# Patient Record
Sex: Female | Born: 1955 | Race: White | Hispanic: Yes | Marital: Married | Smoking: Never smoker
Health system: Southern US, Community
[De-identification: ages and names within clinical notes are randomized; demographics above are authoritative.]

## PROBLEM LIST (undated history)

## (undated) DIAGNOSIS — A5901 Trichomonal vulvovaginitis: Secondary | ICD-10-CM

## (undated) DIAGNOSIS — I350 Nonrheumatic aortic (valve) stenosis: Secondary | ICD-10-CM

## (undated) DIAGNOSIS — I8390 Asymptomatic varicose veins of unspecified lower extremity: Secondary | ICD-10-CM

## (undated) DIAGNOSIS — I10 Essential (primary) hypertension: Secondary | ICD-10-CM

## (undated) DIAGNOSIS — G4730 Sleep apnea, unspecified: Secondary | ICD-10-CM

## (undated) DIAGNOSIS — IMO0002 Reserved for concepts with insufficient information to code with codable children: Secondary | ICD-10-CM

## (undated) DIAGNOSIS — E669 Obesity, unspecified: Secondary | ICD-10-CM

## (undated) DIAGNOSIS — K8020 Calculus of gallbladder without cholecystitis without obstruction: Secondary | ICD-10-CM

## (undated) DIAGNOSIS — K219 Gastro-esophageal reflux disease without esophagitis: Secondary | ICD-10-CM

## (undated) DIAGNOSIS — F4321 Adjustment disorder with depressed mood: Secondary | ICD-10-CM

## (undated) DIAGNOSIS — F32A Depression, unspecified: Secondary | ICD-10-CM

## (undated) DIAGNOSIS — J309 Allergic rhinitis, unspecified: Secondary | ICD-10-CM

## (undated) DIAGNOSIS — Z86010 Personal history of colonic polyps: Secondary | ICD-10-CM

## (undated) DIAGNOSIS — M25569 Pain in unspecified knee: Secondary | ICD-10-CM

## (undated) DIAGNOSIS — T7840XA Allergy, unspecified, initial encounter: Secondary | ICD-10-CM

## (undated) DIAGNOSIS — R109 Unspecified abdominal pain: Secondary | ICD-10-CM

## (undated) DIAGNOSIS — R011 Cardiac murmur, unspecified: Secondary | ICD-10-CM

## (undated) DIAGNOSIS — H9319 Tinnitus, unspecified ear: Secondary | ICD-10-CM

## (undated) DIAGNOSIS — D376 Neoplasm of uncertain behavior of liver, gallbladder and bile ducts: Secondary | ICD-10-CM

## (undated) DIAGNOSIS — F419 Anxiety disorder, unspecified: Secondary | ICD-10-CM

## (undated) HISTORY — DX: Anxiety disorder, unspecified: F41.9

## (undated) HISTORY — DX: Depression, unspecified: F32.A

## (undated) HISTORY — DX: Allergy, unspecified, initial encounter: T78.40XA

## (undated) HISTORY — PX: ABDOMINAL HYSTERECTOMY: SHX81

## (undated) HISTORY — DX: Nonrheumatic aortic (valve) stenosis: I35.0

## (undated) HISTORY — DX: Sleep apnea, unspecified: G47.30

## (undated) HISTORY — PX: APPENDECTOMY: SHX54

## (undated) HISTORY — PX: CHOLECYSTECTOMY: SHX55

## (undated) HISTORY — DX: Essential (primary) hypertension: I10

## (undated) HISTORY — DX: Cardiac murmur, unspecified: R01.1

---
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
## 2004-02-11 ENCOUNTER — Emergency Department (HOSPITAL_COMMUNITY): Admission: EM | Admit: 2004-02-11 | Discharge: 2004-02-11 | Payer: Self-pay | Admitting: Emergency Medicine

## 2004-02-15 ENCOUNTER — Emergency Department (HOSPITAL_COMMUNITY): Admission: EM | Admit: 2004-02-15 | Discharge: 2004-02-15 | Payer: Self-pay | Admitting: Emergency Medicine

## 2005-05-13 ENCOUNTER — Emergency Department (HOSPITAL_COMMUNITY): Admission: EM | Admit: 2005-05-13 | Discharge: 2005-05-13 | Payer: Self-pay | Admitting: Emergency Medicine

## 2008-10-06 ENCOUNTER — Emergency Department (HOSPITAL_COMMUNITY): Admission: EM | Admit: 2008-10-06 | Discharge: 2008-10-06 | Payer: Self-pay | Admitting: Emergency Medicine

## 2009-05-05 ENCOUNTER — Ambulatory Visit: Payer: Self-pay | Admitting: Physician Assistant

## 2009-05-05 ENCOUNTER — Telehealth: Payer: Self-pay | Admitting: Physician Assistant

## 2009-05-05 DIAGNOSIS — R109 Unspecified abdominal pain: Secondary | ICD-10-CM | POA: Insufficient documentation

## 2009-05-05 DIAGNOSIS — I8390 Asymptomatic varicose veins of unspecified lower extremity: Secondary | ICD-10-CM

## 2009-05-05 DIAGNOSIS — I10 Essential (primary) hypertension: Secondary | ICD-10-CM

## 2009-05-05 HISTORY — DX: Essential (primary) hypertension: I10

## 2009-05-05 HISTORY — DX: Asymptomatic varicose veins of unspecified lower extremity: I83.90

## 2009-05-05 HISTORY — DX: Unspecified abdominal pain: R10.9

## 2009-05-09 ENCOUNTER — Ambulatory Visit (HOSPITAL_COMMUNITY): Admission: RE | Admit: 2009-05-09 | Discharge: 2009-05-09 | Payer: Self-pay | Admitting: Internal Medicine

## 2009-05-09 ENCOUNTER — Encounter: Payer: Self-pay | Admitting: Physician Assistant

## 2009-05-12 ENCOUNTER — Encounter: Payer: Self-pay | Admitting: Physician Assistant

## 2009-05-19 ENCOUNTER — Ambulatory Visit: Payer: Self-pay | Admitting: Physician Assistant

## 2009-05-22 ENCOUNTER — Ambulatory Visit (HOSPITAL_COMMUNITY): Admission: RE | Admit: 2009-05-22 | Discharge: 2009-05-22 | Payer: Self-pay | Admitting: Internal Medicine

## 2009-05-22 ENCOUNTER — Encounter: Payer: Self-pay | Admitting: Physician Assistant

## 2009-05-24 ENCOUNTER — Ambulatory Visit: Payer: Self-pay | Admitting: *Deleted

## 2009-05-26 ENCOUNTER — Encounter (INDEPENDENT_AMBULATORY_CARE_PROVIDER_SITE_OTHER): Payer: Self-pay | Admitting: *Deleted

## 2009-05-26 LAB — CONVERTED CEMR LAB
HDL: 48 mg/dL (ref >39)
Triglycerides: 134 mg/dL (ref <150)

## 2009-05-30 ENCOUNTER — Telehealth: Payer: Self-pay | Admitting: Physician Assistant

## 2009-05-30 ENCOUNTER — Encounter: Payer: Self-pay | Admitting: Physician Assistant

## 2009-05-30 DIAGNOSIS — K8020 Calculus of gallbladder without cholecystitis without obstruction: Secondary | ICD-10-CM

## 2009-05-30 DIAGNOSIS — D376 Neoplasm of uncertain behavior of liver, gallbladder and bile ducts: Secondary | ICD-10-CM | POA: Insufficient documentation

## 2009-05-30 HISTORY — DX: Calculus of gallbladder without cholecystitis without obstruction: K80.20

## 2009-05-30 HISTORY — DX: Neoplasm of uncertain behavior of liver, gallbladder and bile ducts: D37.6

## 2009-06-12 ENCOUNTER — Ambulatory Visit: Payer: Self-pay | Admitting: Physician Assistant

## 2009-06-12 DIAGNOSIS — M25569 Pain in unspecified knee: Secondary | ICD-10-CM

## 2009-06-12 DIAGNOSIS — K219 Gastro-esophageal reflux disease without esophagitis: Secondary | ICD-10-CM

## 2009-06-12 HISTORY — DX: Pain in unspecified knee: M25.569

## 2009-06-12 HISTORY — DX: Gastro-esophageal reflux disease without esophagitis: K21.9

## 2009-06-12 LAB — CONVERTED CEMR LAB
Calcium: 9.4 mg/dL (ref 8.4–10.5)
Chloride: 104 meq/L (ref 96–112)
Creatinine, Ser: 0.81 mg/dL (ref 0.40–1.20)

## 2009-06-13 ENCOUNTER — Encounter: Payer: Self-pay | Admitting: Physician Assistant

## 2009-07-14 ENCOUNTER — Telehealth: Payer: Self-pay | Admitting: Physician Assistant

## 2009-07-18 ENCOUNTER — Ambulatory Visit (HOSPITAL_COMMUNITY): Admission: RE | Admit: 2009-07-18 | Discharge: 2009-07-18 | Payer: Self-pay | Admitting: General Surgery

## 2009-08-12 HISTORY — PX: COLONOSCOPY: SHX174

## 2009-08-23 ENCOUNTER — Encounter: Payer: Self-pay | Admitting: Physician Assistant

## 2009-08-30 ENCOUNTER — Ambulatory Visit: Payer: Self-pay | Admitting: Physician Assistant

## 2009-08-30 DIAGNOSIS — A5901 Trichomonal vulvovaginitis: Secondary | ICD-10-CM

## 2009-08-30 DIAGNOSIS — F4321 Adjustment disorder with depressed mood: Secondary | ICD-10-CM | POA: Insufficient documentation

## 2009-08-30 HISTORY — DX: Trichomonal vulvovaginitis: A59.01

## 2009-08-30 HISTORY — DX: Adjustment disorder with depressed mood: F43.21

## 2009-08-31 ENCOUNTER — Encounter: Payer: Self-pay | Admitting: Physician Assistant

## 2009-09-04 ENCOUNTER — Encounter: Payer: Self-pay | Admitting: Physician Assistant

## 2009-09-07 ENCOUNTER — Ambulatory Visit (HOSPITAL_COMMUNITY): Admission: RE | Admit: 2009-09-07 | Discharge: 2009-09-07 | Payer: Self-pay | Admitting: Internal Medicine

## 2009-12-04 ENCOUNTER — Ambulatory Visit: Payer: Self-pay | Admitting: Physician Assistant

## 2009-12-04 DIAGNOSIS — J309 Allergic rhinitis, unspecified: Secondary | ICD-10-CM

## 2009-12-04 HISTORY — DX: Allergic rhinitis, unspecified: J30.9

## 2010-02-22 ENCOUNTER — Encounter (INDEPENDENT_AMBULATORY_CARE_PROVIDER_SITE_OTHER): Payer: Self-pay | Admitting: *Deleted

## 2010-03-06 ENCOUNTER — Ambulatory Visit: Payer: Self-pay | Admitting: Gastroenterology

## 2010-03-12 ENCOUNTER — Ambulatory Visit: Payer: Self-pay | Admitting: Physician Assistant

## 2010-03-12 ENCOUNTER — Encounter (INDEPENDENT_AMBULATORY_CARE_PROVIDER_SITE_OTHER): Payer: Self-pay | Admitting: *Deleted

## 2010-03-12 ENCOUNTER — Telehealth: Payer: Self-pay | Admitting: Gastroenterology

## 2010-03-12 LAB — CONVERTED CEMR LAB
ALT: 19 units/L (ref 0–35)
AST: 19 units/L (ref 0–37)
Albumin: 4.6 g/dL (ref 3.5–5.2)
Alkaline Phosphatase: 67 units/L (ref 39–117)
BUN: 13 mg/dL (ref 6–23)
Basophils Absolute: 0 10*3/uL (ref 0.0–0.1)
Basophils Relative: 0 % (ref 0–1)
CO2: 27 meq/L (ref 19–32)
Calcium: 9.3 mg/dL (ref 8.4–10.5)
Chloride: 102 meq/L (ref 96–112)
Creatinine, Ser: 0.8 mg/dL (ref 0.40–1.20)
Eosinophils Absolute: 0.1 10*3/uL (ref 0.0–0.7)
Eosinophils Relative: 2 % (ref 0–5)
Glucose, Bld: 82 mg/dL (ref 70–99)
HCT: 42.7 % (ref 36.0–46.0)
Hemoglobin: 13.7 g/dL (ref 12.0–15.0)
Lymphocytes Relative: 34 % (ref 12–46)
Lymphs Abs: 2 10*3/uL (ref 0.7–4.0)
MCHC: 32.1 g/dL (ref 30.0–36.0)
MCV: 90.5 fL (ref 78.0–100.0)
Monocytes Absolute: 0.3 10*3/uL (ref 0.1–1.0)
Monocytes Relative: 6 % (ref 3–12)
Neutro Abs: 3.4 10*3/uL (ref 1.7–7.7)
Neutrophils Relative %: 58 % (ref 43–77)
Platelets: 230 10*3/uL (ref 150–400)
Potassium: 4.2 meq/L (ref 3.5–5.3)
RBC: 4.72 M/uL (ref 3.87–5.11)
RDW: 13.5 % (ref 11.5–15.5)
Sodium: 142 meq/L (ref 135–145)
Total Bilirubin: 0.7 mg/dL (ref 0.3–1.2)
Total Protein: 7.5 g/dL (ref 6.0–8.3)
WBC: 5.8 10*3/uL (ref 4.0–10.5)

## 2010-03-13 ENCOUNTER — Encounter: Payer: Self-pay | Admitting: Physician Assistant

## 2010-04-26 ENCOUNTER — Ambulatory Visit: Payer: Self-pay | Admitting: Gastroenterology

## 2010-05-03 ENCOUNTER — Ambulatory Visit: Payer: Self-pay | Admitting: Gastroenterology

## 2010-05-07 ENCOUNTER — Encounter: Payer: Self-pay | Admitting: Gastroenterology

## 2010-05-07 ENCOUNTER — Encounter (INDEPENDENT_AMBULATORY_CARE_PROVIDER_SITE_OTHER): Payer: Self-pay | Admitting: *Deleted

## 2010-05-09 ENCOUNTER — Ambulatory Visit: Payer: Self-pay | Admitting: Physician Assistant

## 2010-05-09 DIAGNOSIS — Z860100 Personal history of colon polyps, unspecified: Secondary | ICD-10-CM

## 2010-05-09 HISTORY — DX: Personal history of colonic polyps: Z86.010

## 2010-05-09 HISTORY — DX: Personal history of colon polyps, unspecified: Z86.0100

## 2010-05-29 ENCOUNTER — Ambulatory Visit: Payer: Self-pay | Admitting: Gastroenterology

## 2010-06-05 ENCOUNTER — Telehealth: Payer: Self-pay | Admitting: Gastroenterology

## 2010-09-07 ENCOUNTER — Telehealth (INDEPENDENT_AMBULATORY_CARE_PROVIDER_SITE_OTHER): Payer: Self-pay | Admitting: Internal Medicine

## 2010-09-07 ENCOUNTER — Ambulatory Visit
Admission: RE | Admit: 2010-09-07 | Discharge: 2010-09-07 | Payer: Self-pay | Source: Home / Self Care | Attending: Internal Medicine | Admitting: Internal Medicine

## 2010-09-07 DIAGNOSIS — H9319 Tinnitus, unspecified ear: Secondary | ICD-10-CM

## 2010-09-07 DIAGNOSIS — IMO0002 Reserved for concepts with insufficient information to code with codable children: Secondary | ICD-10-CM

## 2010-09-07 HISTORY — DX: Tinnitus, unspecified ear: H93.19

## 2010-09-07 HISTORY — DX: Reserved for concepts with insufficient information to code with codable children: IMO0002

## 2010-09-09 LAB — CONVERTED CEMR LAB
ALT: 18 units/L (ref 0–35)
AST: 19 units/L (ref 0–37)
AST: 20 units/L (ref 0–37)
Albumin: 4.5 g/dL (ref 3.5–5.2)
Amylase: 47 units/L (ref 0–105)
BUN: 13 mg/dL (ref 6–23)
Blood in Urine, dipstick: NEGATIVE
CO2: 21 meq/L (ref 19–32)
Calcium: 8.8 mg/dL (ref 8.4–10.5)
Calcium: 8.9 mg/dL (ref 8.4–10.5)
Chlamydia, DNA Probe: NEGATIVE
Chloride: 105 meq/L (ref 96–112)
Chloride: 107 meq/L (ref 96–112)
Creatinine, Ser: 0.77 mg/dL (ref 0.40–1.20)
Eosinophils Absolute: 0.2 10*3/uL (ref 0.0–0.7)
Eosinophils Relative: 3 % (ref 0–5)
GC Probe Amp, Genital: NEGATIVE
Glucose, Urine, Semiquant: NEGATIVE
HCT: 42.1 % (ref 36.0–46.0)
Hemoglobin: 13.5 g/dL (ref 12.0–15.0)
Lymphs Abs: 2 10*3/uL (ref 0.7–4.0)
MCV: 89.8 fL (ref 78.0–100.0)
Monocytes Relative: 6 % (ref 3–12)
Pap Smear: NEGATIVE
RBC: 4.69 M/uL (ref 3.87–5.11)
Sodium: 140 meq/L (ref 135–145)
TSH: 1.622 microintl units/mL (ref 0.350–4.500)
Total Bilirubin: 0.6 mg/dL (ref 0.3–1.2)
WBC: 5.9 10*3/uL (ref 4.0–10.5)
pH: 5

## 2010-09-11 ENCOUNTER — Ambulatory Visit: Admit: 2010-09-11 | Payer: Self-pay | Admitting: Internal Medicine

## 2010-09-11 ENCOUNTER — Encounter (INDEPENDENT_AMBULATORY_CARE_PROVIDER_SITE_OTHER): Payer: Self-pay | Admitting: Internal Medicine

## 2010-09-12 NOTE — Letter (Signed)
Summary: *HSN Results Follow up  [facility]  [address] [place], [place] [address]   Phone: [phone]  Fax: [phone]      03/13/2010   DELOMA SPINDLE [address] [place], [place]  [address]  [place]   Dear  Ms. Damia HUERTA,                            ____S.Drinkard,FNP   ____D. Gore,FNP       ____B. McPherson,MD   ____V. Rankins,MD    ____E. Mulberry,MD    ____N. Daphine Deutscher, FNP  ____D. Reche Dixon, MD    ____K. Philipp Deputy, MD    __x__S. Alben Spittle, PA-C     This letter is to inform you that your recent test(s):  _______Pap Smear    ___x____Lab Test(s)    _______X-ray    ___x____ are normal  _______ requires a medication change  _______ requires a follow-up lab visit  _______ requires a follow-up visit with your provider   Comments:       _________________________________________________________ If you have any questions, please contact our office                     Sincerely,  Tereso Newcomer PA-C [facility]

## 2010-09-12 NOTE — Progress Notes (Signed)
Summary: sch another proc  Phone Note From Other Clinic Call back at [phone]   Caller: [place] from [place] --Tereso Newcomer PA Call For: Arlyce Dice Reason for Call: Schedule Patient Appt Summary of Call: Patient is scheduled to have a colon on 9-22 but Tereso Newcomer PA wants to add an egd for [place]. Initial call taken by: Tawni Levy,  March 12, 2010 11:38 AM  Follow-up for Phone Call        Should this pt. be seen in the office first? Follow-up by: Teryl Lucy RN,  March 12, 2010 11:45 AM  Additional Follow-up for Phone Call Additional follow up Details #1::        yes Additional Follow-up by: Louis Meckel MD,  March 12, 2010 11:54 AM    Additional Follow-up for Phone Call Additional follow up Details #2::    Given NP3 appt. for 04/26/10. Letter sent. Follow-up by: Teryl Lucy RN,  March 12, 2010 3:48 PM

## 2010-09-12 NOTE — Assessment & Plan Note (Signed)
Summary: CPP EXAM//GK   Vital Signs:  Patient profile:   55 year old female Menstrual status:  postmenopausal Height:      60 inches Weight:      234 pounds BMI:     45.87 Temp:     97.8 degrees F oral Pulse rate:   62 / minute Pulse rhythm:   regular Resp:     18 per minute BP sitting:   135 / 79  (left arm) Cuff size:   large  Vitals Entered By: [place] Shannon (August 30, 2009 10:03 AM) CC: cpp... Pain Assessment Patient in pain? no       Does patient need assistance? Functional Status Self care Ambulation Normal   CC:  cpp....  History of Present Illness: Here for CPP. Post menopausal.   No h/o havig a pap smear. Never had a mammo. No vaginal bleeding.  She is c/o some vaginal itching.  She is having increased urinary frequency.  She does have some dysuria.  Symptoms present for about 2 months.   She does note some vaginal discharge and odor. Sexually active with husband only.   No h/o STDs.   Not taking calcium. No FHx of breast or ovarian cancer.  Depression:  PHQ9=10 today.  No h/o medication.  No suicidal thoughts.  Her husband is not working regularly.  Having trouble paying the bills.   Cholelithiasis:  Had cholecystectomy in Dec.  Has been released by surgeon.  She had bx of mass on liver at surgery and path was c/w mucoid cyst.  No further w/u necessary.  Feeling better.  Scars s/w sensitive.  No more RUQ pain or nausea.  Problems Prior to Update: 1)  Routine Gynecological Examination  (ICD-V72.31) 2)  Preventive Health Care  (ICD-V70.0) 3)  Adjustment Disorder With Depressed Mood  (ICD-309.0) 4)  Trichomonal Vaginitis  (ICD-131.01) 5)  Knee Pain  (ICD-719.46) 6)  Gerd  (ICD-530.81) 7)  Cholelithiasis  (ICD-574.20) 8)  Liver Mass  (ICD-235.3) 9)  Abdominal Pain  (ICD-789.00) 10)  Varicose Veins, Lower Extremities  (ICD-454.9) 11)  Essential Hypertension, Benign  (ICD-401.1)  Current Medications (verified): 1)  Miralax  Powd (Polyethylene  Glycol 3350) .... Take One Capful Dissolved in Glass of Water Once Daily; Stop If Causes Diarrhea 2)  Dyazide 37.5-25 Mg Caps (Triamterene-Hctz) .Marland Kitchen.. 1 Tab By Mouth Daily 3)  Protonix 40 Mg Tbec (Pantoprazole Sodium) .... Take 1 Tablet By Mouth Once A Day 4)  Amlodipine Besylate 5 Mg Tabs (Amlodipine Besylate) .... Take 1 Tablet By Mouth Once A Day  Allergies (verified): No Known Drug Allergies  Past History:  Past Medical History: Last updated: 08/23/2009 Current Problems:  VARICOSE VEINS, LOWER EXTREMITIES (ICD-454.9)      -occasional pain and swelling ESSENTIAL HYPERTENSION, BENIGN (ICD-401.1)      -no h/o taking medicines Liver Mass   a. mucoid cyst noted at lap. chole. 07/18/2009; path benign; cx and GS negative  Past Surgical History: Last updated: 08/23/2009 s/p c-section Cholecystectomy (07/18/2009)   a.  Dr. Lindie Spruce  Family History: Reviewed history from 05/05/2009 and no changes required. Father - died with MI/CVA (15) Mom - enlarged heart (76) No cancer HTN- father DM - sister  Social History: Reviewed history from 05/05/2009 and no changes required. Married 5 children  Review of Systems       The patient complains of dyspnea on exertion and depression.  The patient denies fever, chest pain, syncope, melena, hematochezia, severe indigestion/heartburn, hematuria, and suspicious skin lesions.  Physical Exam  General:  alert, well-developed, and well-nourished.   Head:  normocephalic, atraumatic, and no abnormalities observed.   Eyes:  pupils equal, pupils round, pupils reactive to light, and no retinal abnormalitiies.   Ears:  R ear normal and L ear normal.   Nose:  no external deformity.   Mouth:  pharynx pink and moist.   Neck:  supple, no thyromegaly, no carotid bruits, and no cervical lymphadenopathy.   Breasts:  skin/areolae normal, no masses, no abnormal thickening, no nipple discharge, no tenderness, and no adenopathy.   Lungs:  normal breath  sounds, no crackles, and no wheezes.   Heart:  normal rate, regular rhythm, and no murmur.   Abdomen:  soft, non-tender, normal bowel sounds, and no hepatomegaly.   Rectal:  no external abnormalities, no hemorrhoids, normal sphincter tone, and no masses.   Genitalia:  normal introitus, no external lesions, no vaginal discharge, mucosa pink and moist, no vaginal or cervical lesions, no friaility or hemorrhage, and vaginal atrophy.   body habitus makes it difficult to get a good assessment of the fundus and adnexae Msk:  normal ROM.   Extremities:  no edema  Neurologic:  alert & oriented X3, cranial nerves II-XII intact, strength normal in all extremities, and DTRs symmetrical and normal.   Skin:  turgor normal.   Psych:  normally interactive.     Impression & Recommendations:  Problem # 1:  ADJUSTMENT DISORDER WITH DEPRESSED MOOD (ICD-309.0)  PHQ9=10 not certain she needs medication at this point discussed referral to LCSW she would like to go  Orders: Social Work Referral (Social )  Problem # 2:  LIVER MASS (ICD-235.3) Benign findings at surgery no further w/u necessary  Problem # 3:  ESSENTIAL HYPERTENSION, BENIGN (ICD-401.1)  BP stable check CMET  Her updated medication list for this problem includes:    Dyazide 37.5-25 Mg Caps (Triamterene-hctz) .Marland Kitchen... 1 tab by mouth daily    Amlodipine Besylate 5 Mg Tabs (Amlodipine besylate) .Marland Kitchen... Take 1 tablet by mouth once a day  Orders: T-Comprehensive Metabolic Panel ([address])  Problem # 4:  PREVENTIVE HEALTH CARE (ICD-V70.0) stool cards neg couple mos ago d/w patient proceeding with colonoscopy for colon ca screening she would like to proceed will send to Dr. Corinda Gubler when he returns in the Spring  Orders: EKG w/ Interpretation ([address]) Mammogram (Screening) (Mammo) T-HIV Antibody  (Reflex) ([id number]) T-Syphilis Test (RPR) ([address]86)VHQION Orders: Gastroenterology Referral (GI) ... 11/13/2009  Problem # 5:   ROUTINE GYNECOLOGICAL EXAMINATION (ICD-V72.31)  Orders: KOH/ WET Mount [phone]) T- GC Chlamydia ([address]) T-Pap Smear, Thin Prep ([address])  Problem # 6:  TRICHOMONAL VAGINITIS (ICD-131.01) tx with flagyl has clue cells too . . .will cover for BV handout given . . . patient needs to tell husband to be treated  Problem # 7:  GERD (ICD-530.81)  controlled on PPI   Her updated medication list for this problem includes:    Protonix 40 Mg Tbec (Pantoprazole sodium) .Marland Kitchen... Take 1 tablet by mouth once a day  Complete Medication List: 1)  Miralax Powd (Polyethylene glycol 3350) .... Take one capful dissolved in glass of water once daily; stop if causes diarrhea 2)  Dyazide 37.5-25 Mg Caps (Triamterene-hctz) .Marland Kitchen.. 1 tab by mouth daily 3)  Protonix 40 Mg Tbec (Pantoprazole sodium) .... Take 1 tablet by mouth once a day 4)  Amlodipine Besylate 5 Mg Tabs (Amlodipine besylate) .... Take 1 tablet by mouth once a day 5)  Flagyl 500 Mg Tabs (Metronidazole) .... Take  1 tablet by mouth two times a day until all gone.  Other Orders: T-Culture, Urine ([id number])  Patient Instructions: 1)  Schedule appointment with Ethelene Browns. 2)  Your husband needs treatment as well.  He can go to the [facility] and show them the sheet I gave you. 3)  Please schedule a follow-up appointment in 4 months with Ebonie Westerlund for blood pressure. 4)    Prescriptions: FLAGYL 500 MG TABS (METRONIDAZOLE) Take 1 tablet by mouth two times a day until all gone.  #14 x 0   Entered and Authorized by:   Tereso Newcomer PA-C   Signed by:   Tereso Newcomer PA-C on 08/30/2009   Method used:   Faxed to ...       [facility] (retail)       [address] [place], [place]  [address]       Ph: [phone]       Fax: [phone]   RxID:   [id number]   Laboratory Results   Urine Tests  Date/Time Received: August 30, 2009 10:33 AM   Routine Urinalysis   Glucose: negative   (Normal  Range: Negative) Bilirubin: negative   (Normal Range: Negative) Ketone: negative   (Normal Range: Negative) Spec. Gravity: 1.020   (Normal Range: 1.003-1.035) Blood: negative   (Normal Range: Negative) pH: 5.0   (Normal Range: 5.0-8.0) Protein: negative   (Normal Range: Negative) Urobilinogen: 1.0   (Normal Range: 0-1) Nitrite: negative   (Normal Range: Negative) Leukocyte Esterace: moderate   (Normal Range: Negative)      Wet Mount Source: vaginal WBC/hpf: 10-20 Bacteria/hpf: rare Clue cells/hpf: moderate  Negative whiff Yeast/hpf: none Wet Mount KOH: Negative Trichomonas/hpf: moderate     EKG  Procedure date:  08/30/2009  Findings:      NSR HR 74 Normal axis no isch changes

## 2010-09-12 NOTE — Letter (Signed)
Summary: *HSN Results Follow up  [facility]  [address] [place], [place] [address]   Phone: [phone]  Fax: [phone]      09/04/2009   FLOREEN TEEGARDEN [address] [place], [place]  [address]  [place]   Dear  Ms. Jasminemarie HUERTA,                            ____S.Drinkard,FNP   ____D. Gore,FNP       ____B. McPherson,MD   ____V. Rankins,MD    ____E. Mulberry,MD    ____N. Daphine Deutscher, FNP  ____D. Reche Dixon, MD    ____K. Philipp Deputy, MD    __x__S. Alben Spittle, PA-C     This letter is to inform you that your recent test(s):  ___x____Pap Smear    _______Lab Test     _______X-ray    ___x____ is within acceptable limits  _______ requires a medication change  _______ requires a follow-up lab visit  _______ requires a follow-up visit with your provider   Comments:       _________________________________________________________ If you have any questions, please contact our office                     Sincerely,  Tereso Newcomer PA-C [facility]

## 2010-09-12 NOTE — Letter (Signed)
Summary: Results Letter  [facility]  [address] [place], [place] [address]   Phone: [phone]  Fax: [phone]        May 07, 2010 MRN: [id number]    Anita [name] [address] [place], [place]  [address]    Dear Anita [name],  Your biopsies revealed  the presence of a bacteria called H. Pylori.  This is associated with recurrent inflammation of the stomach and duodenum, and recurrent ulcer disease.  My nurse will be calling in a prescription for treatment.            Sincerely,  Louis Meckel MD  This letter has been electronically signed by your physician.  Appended Document: Results Letter Given to patient.

## 2010-09-12 NOTE — Procedures (Signed)
Summary: Upper Endoscopy  Patient: Anita [name] Note: All result statuses are Final unless otherwise noted.  Tests: (1) Upper Endoscopy (EGD)   EGD Upper Endoscopy       DONE     [facility]     [address]     [place], [place]  [address]           ENDOSCOPY PROCEDURE REPORT           PATIENT:  [name], Anita  MR#:  [id number]     BIRTHDATE:  1956/06/11, 54 yrs. old  GENDER:  female           ENDOSCOPIST:  Barbette Hair. Arlyce Dice, MD     Referred by:           PROCEDURE DATE:  05/03/2010     PROCEDURE:  EGD with biopsy     ASA CLASS:  Class II     INDICATIONS:  reflux symptoms despite therapy           MEDICATIONS:   There was residual sedation effect present from     prior procedure., Fentanyl 25 mcg IV, Versed 2 mg IV,     glycopyrrolate (Robinal) 0.2 mg IV, 0.6cc simethancone 0.6 cc PO     TOPICAL ANESTHETIC:  Exactacain Spray           DESCRIPTION OF PROCEDURE:   After the risks benefits and     alternatives of the procedure were thoroughly explained, informed     consent was obtained.  The LB [id number] endoscope was     introduced through the mouth and advanced to the third portion of     the duodenum, without limitations.  The instrument was slowly     withdrawn as the mucosa was fully examined.     <<PROCEDUREIMAGES>>           Mild gastritis was found in the cardia. Mild edema, areas of     submucosal hemorrhage. Bxs taken (see image2).  Otherwise the     examination was normal.    Retroflexed views revealed no     abnormalities.    The scope was then withdrawn from the patient     and the procedure completed.           COMPLICATIONS:  None           ENDOSCOPIC IMPRESSION:     1) Mild gastritis in the cardia     2) Otherwise normal examination     RECOMMENDATIONS:     1) Call office next 2-3 days to schedule an office appointment     for 1 month     2) discontinue protonix; begin dexilant           REPEAT EXAM:  No        ______________________________     Barbette Hair. Arlyce Dice, MD           CC:           n.     eSIGNED:   Barbette Hair. Kaplan at 05/03/2010 12:09 PM           Blanche East, [id number]  Note: An exclamation mark (!) indicates a result that was not dispersed into the flowsheet. Document Creation Date: 05/03/2010 12:09 PM _______________________________________________________________________  (1) Order result status: Final Collection or observation date-time: 05/03/2010 11:52 Requested date-time:  Receipt date-time:  Reported date-time:  Referring Physician:   Ordering Physician: Melvia Heaps [phone]) Specimen Source:  Source:  Launa Grill Order Number: [id number] Lab site:

## 2010-09-12 NOTE — Assessment & Plan Note (Signed)
Summary: F/U FROM ENDO/COLON AND POSITIVE H-PYLORI          Anita [name]   History of Present Illness Visit Type: Follow-up Visit Primary GI MD: Melvia Heaps MD [facility] Primary Provider: Tereso Newcomer PA-C Requesting Provider: Tereso Newcomer PA-C Chief Complaint: Endo/Colon F/U , patient positive for h-pylori History of Present Illness:   Anita [name] has returned following upper and lower endoscopy.  The former demonstrated  mild gastritis.  Biopsies were positive for H. pylori and she was treated with pylera.  A non-adenomatous polyp was removed from the colon.  She is on no GI medicines at this time and complains of burning upper abdominal discomfort.   GI Review of Systems    Reports abdominal pain and  bloating.     Location of  Abdominal pain: generalized.    Denies acid reflux, belching, chest pain, dysphagia with liquids, dysphagia with solids, heartburn, loss of appetite, nausea, vomiting, vomiting blood, weight loss, and  weight gain.        Denies anal fissure, black tarry stools, change in bowel habit, constipation, diarrhea, diverticulosis, fecal incontinence, heme positive stool, hemorrhoids, irritable bowel syndrome, jaundice, light color stool, liver problems, rectal bleeding, and  rectal pain.    Current Medications (verified): 1)  Dyazide 37.5-25 Mg Caps (Triamterene-Hctz) .Marland Kitchen.. 1 Tab By Mouth Daily 2)  Natures Tears  Soln (Artificial Tear Solution) .... Use As Needed  Allergies (verified): No Known Drug Allergies  Past History:  Past Medical History: Reviewed history from 05/09/2010 and no changes required. Current Problems:  VARICOSE VEINS, LOWER EXTREMITIES (ICD-454.9)      -occasional pain and swelling ESSENTIAL HYPERTENSION, BENIGN (ICD-401.1)      -no h/o taking medicines Liver Mass   a. mucoid cyst noted at lap. chole. 07/18/2009; path benign; cx and GS negative GERD   a.  EGD with gastritis and +H. pylori (04/2010) Colonic polyps, hx of   a.  needs redo colo in  2021  Past Surgical History: Reviewed history from 08/23/2009 and no changes required. s/p c-section Cholecystectomy (07/18/2009)   a.  Dr. Lindie Spruce  Family History: Reviewed history from 04/26/2010 and no changes required. Father - died with MI/CVA (4) Mom - enlarged heart (76) No cancer HTN- father DM - sister Family History of Clotting disorder: MGM No FH of Colon Cancer:  Social History: Reviewed history from 04/26/2010 and no changes required. Unemployed Married 5 children Patient has never smoked.  Alcohol Use - no Daily Caffeine Use: one daily  Illicit Drug Use - no  Vital Signs:  Patient profile:   55 year old female Menstrual status:  postmenopausal Height:      60 inches Weight:      233.38 pounds BMI:     45.74 Pulse rate:   84 / minute Pulse rhythm:   regular BP sitting:   150 / 92  (left arm) Cuff size:   regular  Vitals Entered By: June McMurray CMA Duncan Dull) (May 29, 2010 9:40 AM)   Impression & Recommendations:  Problem # 1:  GERD (ICD-530.81) status post treatment for H. pylori. Plan to resume Protonix  Patient Instructions: 1)  Copy sent to : Tereso Newcomer PA-C 2)  Your medications will be renewed 3)  Call back as needed  4)  The medication list was reviewed and reconciled.  All changed / newly prescribed medications were explained.  A complete medication list was provided to the patient / caregiver. Prescriptions: PROTONIX 40 MG SOLR (PANTOPRAZOLE SODIUM) take one tab  daily  #30 x 2   Entered and Authorized by:   Louis Meckel MD   Signed by:   Louis Meckel MD on 05/29/2010   Method used:   Historical   RxID:   [id number]

## 2010-09-12 NOTE — Progress Notes (Signed)
Summary: Needs meds sent to pharmacy  Phone Note Call from Patient Call back at Home Phone [phone]   Call For: Dr Arlyce Dice Summary of Call: went to [facility] to pick up Protonix but were not there. Can we please resend? Does not need a call back, Initial call taken by: Leanor Kail [facility],  June 05, 2010 11:19 AM    Prescriptions: PROTONIX 40 MG SOLR (PANTOPRAZOLE SODIUM) take one tab daily  #30 x 6   Entered by:   Merri Ray CMA (AAMA)   Authorized by:   Louis Meckel MD   Signed by:   Merri Ray CMA (AAMA) on 06/05/2010   Method used:   Faxed to ...       [facility] (retail)       [address] [place], [place]  [address]       Ph: [phone]       Fax: [phone]   RxID:   [id number]

## 2010-09-12 NOTE — Assessment & Plan Note (Signed)
Summary: GERD; HTN   Vital Signs:  Patient profile:   55 year old female Menstrual status:  postmenopausal Height:      60 inches Weight:      229 pounds BMI:     44.89 Temp:     97.8 degrees F oral Pulse rate:   72 / minute Pulse rhythm:   regular Resp:     18 per minute BP sitting:   120 / 82  (left arm) Cuff size:   large  Vitals Entered By: [place] Shannon (May 09, 2010 10:57 AM) CC: f/u on stomach.. meds reviewed, Hypertension Management Is Patient Diabetic? No Pain Assessment Patient in pain? no       Does patient need assistance? Functional Status Self care Ambulation Normal   Primary Care Provider:  Tereso Newcomer PA-C  CC:  f/u on stomach.. meds reviewed and Hypertension Management.  History of Present Illness: Here for f/u.   Has had colo and EGD.  Has H. Pylori.  To start Pylera soon.  On Dexilant.  Stomach feels better.  Less discomfort.  No vomiting.  No hematemesis.  No melena or hematochezia.  Has f/u with GI in 2 weeks.  Hypertension History:      She denies headache, chest pain, dyspnea with exertion, and side effects from treatment.        Positive major cardiovascular risk factors include female age 22 years old or older and hypertension.  Negative major cardiovascular risk factors include non-tobacco-user status.     Current Medications (verified): 1)  Miralax  Powd (Polyethylene Glycol 3350) .... Take One Capful Dissolved in Glass of Water Once Daily; Stop If Causes Diarrhea 2)  Dyazide 37.5-25 Mg Caps (Triamterene-Hctz) .Marland Kitchen.. 1 Tab By Mouth Daily 3)  Amlodipine Besylate 5 Mg Tabs (Amlodipine Besylate) .... Take 1 Tablet By Mouth Once A Day 4)  Natures Tears  Soln (Artificial Tear Solution) .... Use As Needed 5)  Dexilant 60 Mg Cpdr (Dexlansoprazole) .... Take 1 Tab 1/2 Hr Before Breakfast  Allergies (verified): No Known Drug Allergies  Past History:  Past Medical History: Current Problems:  VARICOSE VEINS, LOWER EXTREMITIES  (ICD-454.9)      -occasional pain and swelling ESSENTIAL HYPERTENSION, BENIGN (ICD-401.1)      -no h/o taking medicines Liver Mass   a. mucoid cyst noted at lap. chole. 07/18/2009; path benign; cx and GS negative GERD   a.  EGD with gastritis and +H. pylori (04/2010) Colonic polyps, hx of   a.  needs redo colo in 2021  Physical Exam  General:  alert, well-developed, and well-nourished.   Head:  normocephalic and atraumatic.   Neck:  supple.   Lungs:  normal breath sounds.   Heart:  normal rate and regular rhythm.   Abdomen:  soft and non-tender.   Neurologic:  alert & oriented X3 and cranial nerves II-XII intact.   Psych:  normally interactive.     Impression & Recommendations:  Problem # 1:  GERD (ICD-530.81) being tx for H. pylori has f/u with GI  Her updated medication list for this problem includes:    Dexilant 60 Mg Cpdr (Dexlansoprazole) .Marland Kitchen... Take 1 tab 1/2 hr before breakfast  Problem # 2:  ESSENTIAL HYPERTENSION, BENIGN (ICD-401.1) controlled  Her updated medication list for this problem includes:    Dyazide 37.5-25 Mg Caps (Triamterene-hctz) .Marland Kitchen... 1 tab by mouth daily    Amlodipine Besylate 5 Mg Tabs (Amlodipine besylate) .Marland Kitchen... Take 1 tablet by mouth once a day  Complete Medication  List: 1)  Miralax Powd (Polyethylene glycol 3350) .... Take one capful dissolved in glass of water once daily; stop if causes diarrhea 2)  Dyazide 37.5-25 Mg Caps (Triamterene-hctz) .Marland Kitchen.. 1 tab by mouth daily 3)  Amlodipine Besylate 5 Mg Tabs (Amlodipine besylate) .... Take 1 tablet by mouth once a day 4)  Natures Tears Soln (Artificial tear solution) .... Use as needed 5)  Dexilant 60 Mg Cpdr (Dexlansoprazole) .... Take 1 tab 1/2 hr before breakfast  Hypertension Assessment/Plan:      The patient's hypertensive risk group is category B: At least one risk factor (excluding diabetes) with no target organ damage.  Her calculated 10 year risk of coronary heart disease is 9 %.  Today's  blood pressure is 120/82.  Her blood pressure goal is < 140/90.  Patient Instructions: 1)  Schedule CPE in January 2012.

## 2010-09-12 NOTE — Assessment & Plan Note (Signed)
Summary: bp issues//kt   Vital Signs:  Patient profile:   55 year old female Menstrual status:  postmenopausal Height:      60 inches Weight:      228.3 pounds BMI:     44.75 Temp:     97.9 degrees F Pulse rate:   62 / minute Pulse rhythm:   regular Resp:     18 per minute BP sitting:   136 / 77  (left arm) Cuff size:   large  Vitals Entered ByArthor Captain (December 04, 2009 9:48 AM) CC: F/U BLOOD PRESSURE, Hypertension Management Pain Assessment Patient in pain? no       Does patient need assistance? Functional Status Self care Ambulation Normal Comments NEEDS REFILLS ON DEPRESSION MEDS   Primary Care Provider:  Tereso Newcomer PA-C  CC:  F/U BLOOD PRESSURE and Hypertension Management.  History of Present Illness: Needs refills on BP meds. Ran out of Norvasc a few weeks ago.  Card has expired.  She did not know that she could call for refills.  Card renewed last week.   Notes some itchy watery eyes especially on the left.    Hypertension History:      She denies headache, chest pain, dyspnea with exertion, and syncope.  She notes no problems with any antihypertensive medication side effects.        Positive major cardiovascular risk factors include female age 41 years old or older and hypertension.  Negative major cardiovascular risk factors include non-tobacco-user status.     Problems Prior to Update: 1)  Allergic Rhinitis  (ICD-477.9) 2)  Routine Gynecological Examination  (ICD-V72.31) 3)  Preventive Health Care  (ICD-V70.0) 4)  Adjustment Disorder With Depressed Mood  (ICD-309.0) 5)  Trichomonal Vaginitis  (ICD-131.01) 6)  Knee Pain  (ICD-719.46) 7)  Gerd  (ICD-530.81) 8)  Cholelithiasis  (ICD-574.20) 9)  Liver Mass  (ICD-235.3) 10)  Abdominal Pain  (ICD-789.00) 11)  Varicose Veins, Lower Extremities  (ICD-454.9) 12)  Essential Hypertension, Benign  (ICD-401.1)  Allergies (verified): No Known Drug Allergies  Past History:  Past Medical History: Last  updated: 08/23/2009 Current Problems:  VARICOSE VEINS, LOWER EXTREMITIES (ICD-454.9)      -occasional pain and swelling ESSENTIAL HYPERTENSION, BENIGN (ICD-401.1)      -no h/o taking medicines Liver Mass   a. mucoid cyst noted at lap. chole. 07/18/2009; path benign; cx and GS negative  Past Surgical History: Last updated: 08/23/2009 s/p c-section Cholecystectomy (07/18/2009)   a.  Dr. Lindie Spruce  Physical Exam  General:  alert, well-developed, and well-nourished.   Head:  normocephalic and atraumatic.   Eyes:  pupils equal, pupils round, pupils reactive to light, and pterygium.   Neck:  supple.   Lungs:  normal breath sounds.   Heart:  normal rate and regular rhythm.   Neurologic:  alert & oriented X3 and cranial nerves II-XII intact.   Psych:  normally interactive.     Impression & Recommendations:  Problem # 1:  ESSENTIAL HYPERTENSION, BENIGN (ICD-401.1) fairly well controlled to be out of meds still taking dyazide will refill norvasc  Her updated medication list for this problem includes:    Dyazide 37.5-25 Mg Caps (Triamterene-hctz) .Marland Kitchen... 1 tab by mouth daily    Amlodipine Besylate 5 Mg Tabs (Amlodipine besylate) .Marland Kitchen... Take 1 tablet by mouth once a day  Problem # 2:  PREVENTIVE HEALTH CARE (ICD-V70.0) refer to GI for screening colo  Problem # 3:  ALLERGIC RHINITIS (ICD-477.9) itchy watery eyes also has pteyrgium  which may be irritating her trial of rewetting drops first  Complete Medication List: 1)  Miralax Powd (Polyethylene glycol 3350) .... Take one capful dissolved in glass of water once daily; stop if causes diarrhea 2)  Dyazide 37.5-25 Mg Caps (Triamterene-hctz) .Marland Kitchen.. 1 tab by mouth daily 3)  Protonix 40 Mg Tbec (Pantoprazole sodium) .... Take 1 tablet by mouth once a day 4)  Amlodipine Besylate 5 Mg Tabs (Amlodipine besylate) .... Take 1 tablet by mouth once a day 5)  Natures Tears Soln (Artificial tear solution) .... Use as needed  Hypertension  Assessment/Plan:      The patient's hypertensive risk group is category B: At least one risk factor (excluding diabetes) with no target organ damage.  Her calculated 10 year risk of coronary heart disease is 9 %.  Today's blood pressure is 136/77.  Her blood pressure goal is < 140/90.  Patient Instructions: 1)  Please schedule a follow-up appointment in 4 months with Izella Ybanez for  blood pressure.  Change any future follow ups to 4 months from now. 2)    Prescriptions: NATURES TEARS  SOLN (ARTIFICIAL TEAR SOLUTION) use as needed  #1 x 3   Entered and Authorized by:   Tereso Newcomer PA-C   Signed by:   Tereso Newcomer PA-C on 12/04/2009   Method used:   Faxed to ...       [facility] (retail)       [address] [place], [place]  [address]       Ph: [phone]       Fax: [phone]   RxID:   [id number] AMLODIPINE BESYLATE 5 MG TABS (AMLODIPINE BESYLATE) Take 1 tablet by mouth once a day  #30 x 6   Entered and Authorized by:   Tereso Newcomer PA-C   Signed by:   Tereso Newcomer PA-C on 12/04/2009   Method used:   Faxed to ...       [facility] - [facility] (retail)       [address] [place], [place]  [address]       Ph: [phone]       Fax: [phone]   RxID:   [id number] PROTONIX 40 MG TBEC (PANTOPRAZOLE SODIUM) Take 1 tablet by mouth once a day  #30 x 6   Entered and Authorized by:   Tereso Newcomer PA-C   Signed by:   Tereso Newcomer PA-C on 12/04/2009   Method used:   Faxed to ...       [facility] - [facility] (retail)       [address] [place], [place]  [address]       Ph: [phone]       Fax: [phone]   RxID:   [id number] DYAZIDE 37.5-25 MG CAPS (TRIAMTERENE-HCTZ) 1 tab by mouth daily  #30 x 6   Entered and Authorized by:   Tereso Newcomer PA-C   Signed by:   Tereso Newcomer PA-C on 12/04/2009   Method used:   Faxed to ...       [facility] - [facility] (retail)       [address] [place], [place]  [address]       Ph: [phone]       Fax: [phone]   RxID:   [id number]

## 2010-09-12 NOTE — Letter (Signed)
Summary: [facility] NOTES [facility]  [facility]   Imported By: Arta Bruce 10/04/2009 15:55:40  _____________________________________________________________________  External Attachment:    Type:   Image     Comment:   External Document

## 2010-09-12 NOTE — Letter (Signed)
Summary: *HSN Results Follow up  [facility]  [address] [place], [place] [address]   Phone: [phone]  Fax: [phone]      08/31/2009   Anita [name] [address] [place], [place]  [address]  [place]   Dear  [name],                            ____S.Drinkard,FNP   ____D. Gore,FNP       ____B. McPherson,MD   ____V. Rankins,MD    ____E. Mulberry,MD    ____N. Daphine Deutscher, FNP  ____D. Reche Dixon, MD    ____K. Philipp Deputy, MD    __x__S. Alben Spittle, PA-C     This letter is to inform you that your recent test(s):  _______Pap Smear    ___x____Lab Test     _______X-ray    ___x____ is within acceptable limits  _______ requires a medication change  _______ requires a follow-up lab visit  _______ requires a follow-up visit with your provider   Comments: Pap smear results have not come back yet.  I will send you a letter once I get the results.       _________________________________________________________ If you have any questions, please contact our office                     Sincerely,  Anita Newcomer PA-C [facility]

## 2010-09-12 NOTE — Progress Notes (Signed)
Summary: Office Visit//DEPRESSION SCREENING  Office Visit//DEPRESSION SCREENING   Imported By: Arta Bruce 10/27/2009 14:30:24  _____________________________________________________________________  External Attachment:    Type:   Image     Comment:   External Document

## 2010-09-12 NOTE — Letter (Signed)
Summary: Appt Reminder 2  [facility]  [address] [place], [place] [address]   Phone: [phone]  Fax: [phone]        May 07, 2010 MRN: [id number]    Anita [name] [address] [place], [place]  [address]    Dear [name],   You have a return appointment with Dr.Robert Arlyce Dice on 05-29-10 at 2:30pm. Please remember to bring a complete list of the medicines you are taking, your insurance card and your co-pay.  If you have to cancel or reschedule this appointment, please call before 5:00 pm the evening before to avoid a cancellation fee.  If you have any questions or concerns, please call [phone].    Sincerely,    Laureen Ochs LPN  Appended Document: Appt Reminder 2 Given to patient.

## 2010-09-12 NOTE — Letter (Signed)
Summary: New Patient letter  [facility]  [address] [place], [place] [address]   Phone: [phone]  Fax: [phone]       03/12/2010 MRN: [id number]  Anita [name] [address] [place], [place]  [address]  [place]  Dear Ms. Anita [name],  Welcome to the Gastroenterology Division at [facility].    You are scheduled to see Dr.  Melvia Heaps  on 04/26/2010 at  10:45 a.m. on the 3rd floor at [facility], [address]  We ask that you try to arrive at our office 15 minutes prior to your appointment time to allow for check-in.  We would like you to complete the enclosed self-administered evaluation form prior to your visit and bring it with you on the day of your appointment.  We will review it with you.  Also, please bring a complete list of all your medications or, if you prefer, bring the medication bottles and we will list them.  Please bring your insurance card so that we may make a copy of it.  If your insurance requires a referral to see a specialist, please bring your referral form from your primary care physician.  Co-payments are due at the time of your visit and may be paid by cash, check or credit card.     Your office visit will consist of a consult with your physician (includes a physical exam), any laboratory testing he/she may order, scheduling of any necessary diagnostic testing (e.g. x-ray, ultrasound, CT-scan), and scheduling of a procedure (e.g. Endoscopy, Colonoscopy) if required.  Please allow enough time on your schedule to allow for any/all of these possibilities.    If you cannot keep your appointment, please call [phone] to cancel or reschedule prior to your appointment date.  This allows [place] the opportunity to schedule an appointment for another patient in need of care.  If you do not cancel or reschedule by 5 p.m. the business day prior to your appointment date, you will be charged a $50.00 late cancellation/no-show fee.     Thank you for choosing [facility] for your medical needs.  We appreciate the opportunity to care for you.  Please visit [place] at our website  to learn more about our practice.                     Sincerely,                                                             The Gastroenterology Division

## 2010-09-12 NOTE — Assessment & Plan Note (Signed)
Summary: GERd-poss. endo   History of Present Illness Visit Type: consult  Primary GI MD: Melvia Heaps MD [facility] Primary Provider: Tereso Newcomer PA-C Requesting Provider: Tereso Newcomer PA-C Chief Complaint: Right side abd pain, GERD, bloating, and weight gain History of Present Illness:   Ms. Anita [name] is a 55yo Hispanic female referred at the request of Tereso Newcomer for evaluation of pyrosis, dyspepsia and abdominal bloating.  History was provided through an interpreter.  She takes protonix  twice a day.  She complains of breakthrough pyrosis.  She denies dysphagia.  She moves her bowels regularly.  She denies  history of melena or hematochezia.  She has occasional pain both in the right and left upper quadrants.  She is  status post cholecystectomy.  A liver mass seen on CT proved to be a mucous cyst when examined at surgery.   GI Review of Systems    Reports abdominal pain, acid reflux, bloating, heartburn, and  weight gain.     Location of  Abdominal pain: right side.    Denies belching, chest pain, dysphagia with liquids, dysphagia with solids, loss of appetite, nausea, vomiting, vomiting blood, and  weight loss.        Denies anal fissure, black tarry stools, change in bowel habit, constipation, diarrhea, diverticulosis, fecal incontinence, heme positive stool, hemorrhoids, irritable bowel syndrome, jaundice, light color stool, liver problems, rectal bleeding, and  rectal pain.    Current Medications (verified): 1)  Miralax  Powd (Polyethylene Glycol 3350) .... Take One Capful Dissolved in Glass of Water Once Daily; Stop If Causes Diarrhea 2)  Dyazide 37.5-25 Mg Caps (Triamterene-Hctz) .Marland Kitchen.. 1 Tab By Mouth Daily 3)  Protonix 40 Mg Tbec (Pantoprazole Sodium) .... Take 1 Tablet By Mouth Two Times A Day (Pharmacy Note Increase in Med) 4)  Amlodipine Besylate 5 Mg Tabs (Amlodipine Besylate) .... Take 1 Tablet By Mouth Once A Day 5)  Natures Tears  Soln (Artificial Tear Solution) .... Use As  Needed  Allergies (verified): No Known Drug Allergies  Past History:  Past Medical History: Current Problems:  VARICOSE VEINS, LOWER EXTREMITIES (ICD-454.9)      -occasional pain and swelling ESSENTIAL HYPERTENSION, BENIGN (ICD-401.1)      -no h/o taking medicines Liver Mass   a. mucoid cyst noted at lap. chole. 07/18/2009; path benign; cx and GS negative GERD  Past Surgical History: Reviewed history from 08/23/2009 and no changes required. s/p c-section Cholecystectomy (07/18/2009)   a.  Dr. Lindie Spruce  Family History: Father - died with MI/CVA 01-11-2069) Mom - enlarged heart January 12, 1975) No cancer HTN- father DM - sister Family History of Clotting disorder: MGM No FH of Colon Cancer:  Social History: Unemployed Married 5 children Patient has never smoked.  Alcohol Use - no Daily Caffeine Use: one daily  Illicit Drug Use - no Drug Use:  no  Review of Systems       The patient complains of sleeping problems.  The patient denies allergy/sinus, anemia, anxiety-new, arthritis/joint pain, back pain, blood in urine, breast changes/lumps, change in vision, confusion, cough, coughing up blood, depression-new, fainting, fatigue, fever, headaches-new, hearing problems, heart murmur, heart rhythm changes, itching, menstrual pain, muscle pains/cramps, night sweats, nosebleeds, pregnancy symptoms, shortness of breath, skin rash, sore throat, swelling of feet/legs, swollen lymph glands, thirst - excessive , urination - excessive , urination changes/pain, urine leakage, vision changes, and voice change.         All other systems were reviewed and were negative   Vital Signs:  Patient profile:   55 year old female Menstrual status:  postmenopausal Height:      60 inches Weight:      231 pounds BMI:     45.28 BSA:     1.99 Pulse rate:   60 / minute Pulse rhythm:   regular BP sitting:   142 / 86  (left arm) Cuff size:   large  Vitals Entered By: Ok Anis CMA (April 26, 2010 10:19  AM)  Physical Exam  Additional Exam:  On physical exam she is an obese female  skin: anicteric HEENT: normocephalic; PEERLA; no nasal or pharyngeal abnormalities neck: supple nodes: no cervical lymphadenopathy chest: clear to ausculatation and percussion heart: no murmurs, gallops, or rubs abd: soft, nontender; BS normoactive; no abdominal masses, tenderness, organomegaly rectal: deferred ext: no cynanosis, clubbing, edema skeletal: no deformities neuro: oriented x 3; no focal abnormalities    Impression & Recommendations:  Problem # 1:  GERD (ICD-530.81) She has mild persistent symptoms on twice a day Protonix.  Recommendations #1 upper endoscopy for further evaluation #2 to consider switching PPI pending results #1  Risks, alternatives, and complications of the procedure, including bleeding, perforation, and possible need for surgery, were explained to the patient.  Patient's questions were answered.  Problem # 2:  SPECIAL SCREENING FOR MALIGNANT NEOPLASMS COLON (ICD-V76.51) Plan screening colonoscopy  Patient Instructions: 1)  Copy sent to : Tereso Newcomer PA-C 2)  Your Colon/Endo is scheduled for 05/03/2010 at 10:30 to arrive at 9:30am 3)  You have already recieved all instructions from your PreVisit 4)  The medication list was reviewed and reconciled.  All changed / newly prescribed medications were explained.  A complete medication list was provided to the patient / caregiver.

## 2010-09-12 NOTE — Miscellaneous (Signed)
Summary: note from surgeon, lap cholecyst. notes and path report reviewed  Clinical Lists Changes  Problems: Assessed LIVER MASS as comment only - at cholecystectomy on 07/18/2009, liver mass noted to exude milky white material gram stain neg for wbc's or organisms culture negative path report states calcific debris noted  Assessed CHOLELITHIASIS as comment only - at cholecystectomy, chronic cholecystitis and cholelithiasis noted  Observations: Added new observation of PAST MED HX: Current Problems:  VARICOSE VEINS, LOWER EXTREMITIES (ICD-454.9)      -occasional pain and swelling ESSENTIAL HYPERTENSION, BENIGN (ICD-401.1)      -no h/o taking medicines Liver Mass   a. mucoid cyst noted at lap. chole. 07/18/2009; path benign; cx and GS negative      (08/23/2009 12:55) Added new observation of CHOLECYSTECT: yes (08/23/2009 12:55) Added new observation of PAST SURG HX: s/p c-section Cholecystectomy (07/18/2009)   a.  Dr. Lindie Spruce (08/23/2009 12:55)       Impression & Recommendations:  Problem # 1:  LIVER MASS (ICD-235.3) Assessment Comment Only at cholecystectomy on 07/18/2009, liver mass noted to exude milky white material gram stain neg for wbc's or organisms culture negative path report states calcific debris noted  Problem # 2:  CHOLELITHIASIS (ICD-574.20) at cholecystectomy, chronic cholecystitis and cholelithiasis noted  Complete Medication List: 1)  Miralax Powd (Polyethylene glycol 3350) .... Take one capful dissolved in glass of water once daily; stop if causes diarrhea 2)  Dyazide 37.5-25 Mg Caps (Triamterene-hctz) .Marland Kitchen.. 1 tab by mouth daily 3)  Protonix 40 Mg Tbec (Pantoprazole sodium) .... Take 1 tablet by mouth once a day 4)  Amlodipine Besylate 5 Mg Tabs (Amlodipine besylate) .... Take 1 tablet by mouth once a day   Past History:  Past Medical History: Current Problems:  VARICOSE VEINS, LOWER EXTREMITIES (ICD-454.9)      -occasional pain and  swelling ESSENTIAL HYPERTENSION, BENIGN (ICD-401.1)      -no h/o taking medicines Liver Mass   a. mucoid cyst noted at lap. chole. 07/18/2009; path benign; cx and GS negative  Past Surgical History: s/p c-section Cholecystectomy (07/18/2009)   a.  Dr. Lindie Spruce

## 2010-09-12 NOTE — Miscellaneous (Signed)
  Clinical Lists Changes  Medications: Removed medication of PROTONIX 40 MG TBEC (PANTOPRAZOLE SODIUM) Take 1 tablet by mouth two times a day (pharmacy note increase in med) Added new medication of DEXILANT 60 MG CPDR (DEXLANSOPRAZOLE) take 1 tab 1/2 hr before breakfast - Signed Rx of DEXILANT 60 MG CPDR (DEXLANSOPRAZOLE) take 1 tab 1/2 hr before breakfast;  #30 x 1;  Signed;  Entered by: Louis Meckel MD;  Authorized by: Louis Meckel MD;  Method used: Print then Give to Patient    Prescriptions: DEXILANT 60 MG CPDR (DEXLANSOPRAZOLE) take 1 tab 1/2 hr before breakfast  #30 x 1   Entered and Authorized by:   Louis Meckel MD   Signed by:   Louis Meckel MD on 05/03/2010   Method used:   Print then Give to Patient   RxID:   [id number]

## 2010-09-12 NOTE — Assessment & Plan Note (Signed)
Summary: GERD   Vital Signs:  Patient profile:   55 year old female Menstrual status:  postmenopausal Weight:      228 pounds BMI:     44.69 Temp:     97.6 degrees F Pulse rate:   57 / minute Pulse rhythm:   regular Resp:     18 per minute BP sitting:   130 / 80  (right arm) Cuff size:   large  Vitals Entered By: Dutch Quint RN (March 12, 2010 10:20 AM) CC: office vist, stomach pains for about stomach three weeks periodically, no OTC taken Is Patient Diabetic? No Pain Assessment Patient in pain? no       Does patient need assistance? Functional Status Self care Ambulation Normal   Primary Care Provider:  Tereso Newcomer PA-C  CC:  office vist, stomach pains for about stomach three weeks periodically, and no OTC taken.  History of Present Illness: Patient in to office with epigastric and RUQ pain. She had lap chole in 07/2009.   Started having pain again 3 weeks ago.  No pain before this.  Notes pain after eating.  Pain better with taking Protonix, but comes back next day.  No hematemesis.  No hematochezia or melena.  No vomiting or diarrhea.  No coffee.  No cigs.  No spicy foods.  No dysphagia.  No odynophagia.  Thinks she has some pain similar to what she had before her cholecystectomy.  She is to have EGD and colo soon, but patient postponed due to fear of procedure.    Problems Prior to Update: 1)  Allergic Rhinitis  (ICD-477.9) 2)  Routine Gynecological Examination  (ICD-V72.31) 3)  Preventive Health Care  (ICD-V70.0) 4)  Adjustment Disorder With Depressed Mood  (ICD-309.0) 5)  Trichomonal Vaginitis  (ICD-131.01) 6)  Knee Pain  (ICD-719.46) 7)  Gerd  (ICD-530.81) 8)  Cholelithiasis  (ICD-574.20) 9)  Liver Mass  (ICD-235.3) 10)  Abdominal Pain  (ICD-789.00) 11)  Varicose Veins, Lower Extremities  (ICD-454.9) 12)  Essential Hypertension, Benign  (ICD-401.1)  Current Medications (verified): 1)  Miralax  Powd (Polyethylene Glycol 3350) .... Take One Capful  Dissolved in Glass of Water Once Daily; Stop If Causes Diarrhea 2)  Dyazide 37.5-25 Mg Caps (Triamterene-Hctz) .Marland Kitchen.. 1 Tab By Mouth Daily 3)  Protonix 40 Mg Tbec (Pantoprazole Sodium) .... Take 1 Tablet By Mouth Once A Day 4)  Amlodipine Besylate 5 Mg Tabs (Amlodipine Besylate) .... Take 1 Tablet By Mouth Once A Day 5)  Natures Tears  Soln (Artificial Tear Solution) .... Use As Needed  Allergies (verified): No Known Drug Allergies  Past History:  Past Medical History: Reviewed history from 08/23/2009 and no changes required. Current Problems:  VARICOSE VEINS, LOWER EXTREMITIES (ICD-454.9)      -occasional pain and swelling ESSENTIAL HYPERTENSION, BENIGN (ICD-401.1)      -no h/o taking medicines Liver Mass   a. mucoid cyst noted at lap. chole. 07/18/2009; path benign; cx and GS negative  Physical Exam  General:  alert, well-developed, and well-nourished.   Head:  normocephalic and atraumatic.   Eyes:  pupils equal, pupils round, and pupils reactive to light.  palp conj pink bilat Neck:  supple.   Lungs:  normal breath sounds, no crackles, and no wheezes.   Heart:  normal rate and regular rhythm.   Abdomen:  soft, normal bowel sounds, no hepatomegaly, and no splenomegaly.  very mild epigastric pain with palp Neurologic:  alert & oriented X3 and cranial nerves II-XII intact.  Psych:  normally interactive.     Impression & Recommendations:  Problem # 1:  GERD (ICD-530.81)  encouraged her to get her EGD and colo done increase Protonix to two times a day check CBC and CMET (make sure LFTs ok with h/o cholecystectomy)  Her updated medication list for this problem includes:    Protonix 40 Mg Tbec (Pantoprazole sodium) .Marland Kitchen... Take 1 tablet by mouth two times a day (pharmacy note increase in med)  Orders: T-Comprehensive Metabolic Panel ([address]) T-CBC w/Diff ([address]-91478)  Complete Medication List: 1)  Miralax Powd (Polyethylene glycol 3350) .... Take one capful dissolved  in glass of water once daily; stop if causes diarrhea 2)  Dyazide 37.5-25 Mg Caps (Triamterene-hctz) .Marland Kitchen.. 1 tab by mouth daily 3)  Protonix 40 Mg Tbec (Pantoprazole sodium) .... Take 1 tablet by mouth two times a day (pharmacy note increase in med) 4)  Amlodipine Besylate 5 Mg Tabs (Amlodipine besylate) .... Take 1 tablet by mouth once a day 5)  Natures Tears Soln (Artificial tear solution) .... Use as needed  Patient Instructions: 1)  Avoid foods high in acid(tomatoes, citrus juices,spicy foods).Avoid eating within two hours of lying down or before exercising. Do not over eat: try smaller more frequent meals. Elevate head of bed twelve inches when sleeping.  2)  Increase Protonix to two times a day.  A new prescription was sent to [facility]. pharmacy. 3)  Schedule follow up with Sharryn Belding in 3-4 weeks.  Return sooner if stomach pain no better or worse. Prescriptions: PROTONIX 40 MG TBEC (PANTOPRAZOLE SODIUM) Take 1 tablet by mouth two times a day (pharmacy note increase in med)  #60 x 3   Entered and Authorized by:   Tereso Newcomer PA-C   Signed by:   Tereso Newcomer PA-C on 03/12/2010   Method used:   Faxed to ...       [facility] (retail)       [address] [place], [place]  [address]       Ph: [phone]       Fax: [phone]   RxID:   [id number]

## 2010-09-12 NOTE — Letter (Signed)
Summary: Previsit letter  [facility]  [address] [place], [place] [address]   Phone: [phone]  Fax: [phone]       02/22/2010 MRN: [id number]  Anita [name] [address] [place], [place]  [address]  [place]  Dear Anita [name],  Welcome to the Gastroenterology Division at [facility].    You are scheduled to see a nurse for your pre-procedure visit on 03-02-10 at 11:00a.m. on the 3rd floor at [facility], [address]  We ask that you try to arrive at our office 15 minutes prior to your appointment time to allow for check-in.  Your nurse visit will consist of discussing your medical and surgical history, your immediate family medical history, and your medications.    Please bring a complete list of all your medications or, if you prefer, bring the medication bottles and we will list them.  We will need to be aware of both prescribed and over the counter drugs.  We will need to know exact dosage information as well.  If you are on blood thinners (Coumadin, Plavix, Aggrenox, Ticlid, etc.) please call our office today/prior to your appointment, as we need to consult with your physician about holding your medication.   Please be prepared to read and sign documents such as consent forms, a financial agreement, and acknowledgement forms.  If necessary, and with your consent, a friend or relative is welcome to sit-in on the nurse visit with you.  Please bring your insurance card so that we may make a copy of it.  If your insurance requires a referral to see a specialist, please bring your referral form from your primary care physician.  No co-pay is required for this nurse visit.     If you cannot keep your appointment, please call [phone] to cancel or reschedule prior to your appointment date.  This allows [place] the opportunity to schedule an appointment for another patient in need of care.    Thank you for choosing [facility] for your  medical needs.  We appreciate the opportunity to care for you.  Please visit [place] at our website  to learn more about our practice.                     Sincerely.                                                                                                                   The Gastroenterology Division

## 2010-09-12 NOTE — Letter (Signed)
Summary: Patient Notice-Hyperplastic Polyps  [facility]  [address] [place], [place] [address]   Phone: [phone]  Fax: [phone]        May 07, 2010 MRN: [id number]    Anita [name] [address] [place], [place]  [address]    Dear [name],  I am pleased to inform you that the colon polyp(s) removed during your recent colonoscopy was (were) found to be hyperplastic.  These types of polyps are NOT pre-cancerous.  It is therefore my recommendation that you have a repeat colonoscopy examination in 10_ years for routine colorectal cancer screening.  Should you develop new or worsening symptoms of abdominal pain, bowel habit changes or bleeding from the rectum or bowels, please schedule an evaluation with either your primary care physician or with me.  Additional information/recommendations:  __No further action with gastroenterology is needed at this time.      Please follow-up with your primary care physician for your other      healthcare needs. __Please call [phone] to schedule a return visit to review      your situation.  __Please keep your follow-up visit as already scheduled.  _x_Continue treatment plan as outlined the day of your exam.  Please call us if you are having persistent problems or have questions about your condition that have not been fully answered at this time.  Sincerely,  Louis Meckel MD This letter has been electronically signed by your physician.  Appended Document: Patient Notice-Hyperplastic Polyps Letter mailed to patient. Recall is in IDX for 04/2020.

## 2010-09-12 NOTE — Miscellaneous (Signed)
Summary: [facility] PV  Clinical Lists Changes  Observations: Added new observation of NKA: T (03/06/2010 10:19)   Pt does not speak English.  Daughter with pt. in PV to interpret.  Pt cannot afford Moviprep.  Rx for Miralax, Dulcolax, and Reglan called in to [facility].  Instructions and prep instructions reviewed with pt. in spanish.

## 2010-09-12 NOTE — Procedures (Signed)
Summary: Colonoscopy  Patient: Anita [name] Note: All result statuses are Final unless otherwise noted.  Tests: (1) Colonoscopy (COL)   COL Colonoscopy           DONE     [facility]     [address]     [place], [place]  [address]           COLONOSCOPY PROCEDURE REPORT           PATIENT:  Anita, [name]  MR#:  [id number]     BIRTHDATE:  01-Jul-1956, 54 yrs. old  GENDER:  female           ENDOSCOPIST:  Barbette Hair. Arlyce Dice, MD     Referred by:  Tereso Newcomer, P.A.-C           PROCEDURE DATE:  05/03/2010     PROCEDURE:     ASA CLASS:  Class II     INDICATIONS:  1) Routine Risk Screening           MEDICATIONS:   Fentanyl 75 mcg IV, Versed 6 mg IV           DESCRIPTION OF PROCEDURE:   After the risks benefits and     alternatives of the procedure were thoroughly explained, informed     consent was obtained.  Digital rectal exam was performed and     revealed no abnormalities.   The LB CF-[id number] endoscope     was introduced through the anus and advanced to the cecum, which     was identified by the ileocecal valve, without limitations.  The     quality of the prep was excellent, using MoviPrep.  The instrument     was then slowly withdrawn as the colon was fully examined.     <<PROCEDUREIMAGES>>           FINDINGS:  A sessile polyp was found in the descending colon. It     was 3 mm in size. Polyp was snared without cautery. Retrieval was     successful (see image15). snare polyp  This was otherwise a normal     examination of the colon (see image1, image2, image3, image5,     image6, image10, image17, image18, and image20).   Retroflexed     views in the rectum revealed no abnormalities.    The time to     cecum =  3.0  minutes. The scope was then withdrawn (time =  11.0     min) from the patient and the procedure completed.           COMPLICATIONS:  None           ENDOSCOPIC IMPRESSION:     1) 3 mm sessile polyp in the descending colon     2) Otherwise  normal examination     RECOMMENDATIONS:     1) If the polyp(s) removed today are proven to be adenomatous     (pre-cancerous) polyps, you will need a repeat colonoscopy in 5     years. Otherwise you should continue to follow colorectal cancer     screening guidelines for "routine risk" patients with colonoscopy     in 10 years.           REPEAT EXAM:   You will receive a letter from Dr. Arlyce Dice in 1-2     weeks, after reviewing the final pathology, with followup     recommendations.  ______________________________     Barbette Hair Arlyce Dice, MD           CC:           n.     eSIGNED:   Barbette Hair. Kaplan at 05/03/2010 12:03 PM           Blanche East, [id number]  Note: An exclamation mark (!) indicates a result that was not dispersed into the flowsheet. Document Creation Date: 05/03/2010 12:05 PM _______________________________________________________________________  (1) Order result status: Final Collection or observation date-time: 05/03/2010 11:43 Requested date-time:  Receipt date-time:  Reported date-time:  Referring Physician:   Ordering Physician: Melvia Heaps [phone]) Specimen Source:  Source: Launa Grill Order Number: [id number] Lab site:   Appended Document: Colonoscopy     Procedures Next Due Date:    Colonoscopy: 04/2020

## 2010-09-13 NOTE — Progress Notes (Signed)
Summary: Dental/dentures  Phone Note Outgoing Call   Summary of Call: Arna Medici:  can you check with dental clinic as to where Anita [name] can go to get the best deal on dentures?  She has no upper teeth Initial call taken by: Julieanne Manson MD,  September 07, 2010 10:53 AM  Follow-up for Phone Call        [facility] don't know . I did my research is a place call [facility] in [place] and I call the pt and i told her that the cost will be for custom dentures [phone] and [facility] $[phone] and the pt's daugher she knows where is the pace  Follow-up by: Cheryll Dessert,  September 07, 2010 11:39 AM

## 2010-09-13 NOTE — Assessment & Plan Note (Signed)
Summary: 2236 PT//FU FOR CPP////KT   Vital Signs:  Patient profile:   55 year old female Menstrual status:  postmenopausal Weight:      241.31 pounds Temp:     97.3 degrees F oral Pulse rate:   64 / minute Pulse rhythm:   regular Resp:     16 per minute BP sitting:   140 / 96  (left arm) Cuff size:   regular  Vitals Entered By: Hale Drone CMA (September 07, 2010 9:41 AM) CC: Here for a f/u from 05/10/11. Does not want the CPP today. Just wants BP checked and complaining of bilateral ear noise (ringing).  Is Patient Diabetic? No Pain Assessment Patient in pain? no       Does patient need assistance? Functional Status Self care Ambulation Normal   Primary Care Provider:  Tereso Newcomer PA-C  CC:  Here for a f/u from 05/10/11. Does not want the CPP today. Just wants BP checked and complaining of bilateral ear noise (ringing). .  History of Present Illness: 55 yo female, previous pt. of Tereso Newcomer, [place] here for 2 concerns:  1.  Right ear hissing type tinnitus and left ear pain:  both have been going on for 2 months.  Can have tinnitus in left ear at times.  Has not noted any hearing loss.  No associated dizziness.  Never exposed to loud music.    2.  Hypertension:  Pt. off Dyazide for 30 days.  Ran out end of December.  Is not exercising or really trying to change diet for weight loss.  Does not really like vegetables.  Does like fruit.    3.  Gastritis:  Doing well with this.  Now back on Protonix.   Current Medications (verified): 1)  Dyazide 37.5-25 Mg Caps (Triamterene-Hctz) .Marland Kitchen.. 1 Tab By Mouth Daily 2)  Natures Tears  Soln (Artificial Tear Solution) .... Use As Needed 3)  Protonix 40 Mg Solr (Pantoprazole Sodium) .... Take One Tab Daily  Allergies (verified): No Known Drug Allergies  Physical Exam  General:  Morbidly obese, NAD Ears:  TMs pearly gray bilaterally Mouth:  Not teeth in upper gingiva save for piece still in back of right upper  jaw Neck:  No  deformities, masses, or tenderness noted. Lungs:  Normal respiratory effort, chest expands symmetrically. Lungs are clear to auscultation, no crackles or wheezes. Heart:  Normal rate and regular rhythm. S1 and S2 normal without gallop, murmur, click, rub or other extra sounds.  Radial pulses normal and equal   Impression & Recommendations:  Problem # 1:  ESSENTIAL HYPERTENSION, BENIGN (ICD-401.1) Restart Dyazide Discussed exercise and better eating habits for weight loss as treatment as well. Her updated medication list for this problem includes:    Dyazide 37.5-25 Mg Caps (Triamterene-hctz) .Marland Kitchen... 1 tab by mouth daily  Problem # 2:  GERD (ICD-530.81) Gastritis--doing well. Her updated medication list for this problem includes:    Protonix 40 Mg Solr (Pantoprazole sodium) .Marland Kitchen... Take one tab daily  Problem # 3:  TINNITUS, CHRONIC, RIGHT (ICD-388.30) Discussed soft music to cover hissing noise. To call if feels she is having hearing loss  Problem # 4:  TOOTH LOSS (ICD-525.19) Would like to go somewhere for upper dentures--not sure where she can get dentures at a lower cost--will check with dental clinic.  Complete Medication List: 1)  Dyazide 37.5-25 Mg Caps (Triamterene-hctz) .Marland Kitchen.. 1 tab by mouth daily 2)  Natures Tears Soln (Artificial tear solution) .... Use as needed 3)  Protonix 40 Mg Solr (Pantoprazole sodium) .... Take one tab daily  Other Orders: Flu Vaccine 76yrs + ([address]) Admin 1st Vaccine ([address])  Patient Instructions: 1)  Referral to Susie Piper. 2)  Nurse visit for bp check and bmet in 1 month--hypertension 3)  Follow up with Dr. Delrae Alfred in 6 months  Prescriptions: DYAZIDE 37.5-25 MG CAPS (TRIAMTERENE-HCTZ) 1 tab by mouth daily  #30 x 11   Entered and Authorized by:   Julieanne Manson MD   Signed by:   Julieanne Manson MD on 09/07/2010   Method used:   Print then Give to Patient   RxID:   [id number]    Orders Added: 1)  Flu Vaccine 72yrs + [[address]] 2)   Admin 1st Vaccine [90471] 3)  Est. Patient Level III [78469]   Immunizations Administered:  Influenza Vaccine # 1:    Vaccine Type: Fluvax 3+    Site: left deltoid    Mfr: GlaxoSmithKline    Dose: 0.5 ml    Route: IM    Given by: Hale Drone CMA    Exp. Date: 02/09/2011    Lot #: [id number]    VIS given: 03/06/10 version given September 07, 2010.  Flu Vaccine Consent Questions:    Do you have a history of severe allergic reactions to this vaccine? no    Any prior history of allergic reactions to egg and/or gelatin? no    Do you have a sensitivity to the preservative Thimersol? no    Do you have a past history of Guillan-Barre Syndrome? no    Do you currently have an acute febrile illness? no    Have you ever had a severe reaction to latex? no    Vaccine information given and explained to patient? yes    Are you currently pregnant? no   Immunizations Administered:  Influenza Vaccine # 1:    Vaccine Type: Fluvax 3+    Site: left deltoid    Mfr: GlaxoSmithKline    Dose: 0.5 ml    Route: IM    Given by: Hale Drone CMA    Exp. Date: 02/09/2011    Lot #: [id number]    VIS given: 03/06/10 version given September 07, 2010.

## 2010-09-27 NOTE — Letter (Signed)
Summary: NUTRITION SUMMARY//SUSIE  NUTRITION SUMMARY//SUSIE   Imported By: Arta Bruce 09/21/2010 11:02:30  _____________________________________________________________________  External Attachment:    Type:   Image     Comment:   External Document

## 2010-09-27 NOTE — Letter (Signed)
Summary: Anita [name] SUMMARY  [facility] SUMMARY   Imported ByArta Bruce 09/17/2010 16:42:48  _____________________________________________________________________  External Attachment:    Type:   Image     Comment:   External Document

## 2010-10-16 ENCOUNTER — Encounter: Payer: Self-pay | Admitting: *Deleted

## 2010-10-16 ENCOUNTER — Encounter (INDEPENDENT_AMBULATORY_CARE_PROVIDER_SITE_OTHER): Payer: Self-pay | Admitting: *Deleted

## 2010-10-23 NOTE — Assessment & Plan Note (Signed)
Summary: BP check  Nurse Visit   Vital Signs:  Patient profile:   55 year old female Menstrual status:  postmenopausal Pulse rate:   63 / minute Resp:     17 per minute BP sitting:   171 / 88  (left arm) Cuff size:   regular  Vitals Entered By: Sharen Heck RN (October 16, 2010 10:39 AM)  Primary Care Provider:  Tereso Newcomer PA-C  CC:  Pt. here for BP check and BMET. Pt. has not taken BP med today.Marland Kitchen  History of Present Illness: Daughter with pt. to interpret. 09/07/10  BP 140/96 P64.  No med change -- just wanted BP checked.   Review of Systems CV:  Denies CP, SOB, dizziness, visual changes, peripheral edema..   Physical Exam  General:  alert, well-developed, well-nourished, well-hydrated, and overweight-appearing.     Impression & Recommendations:  Problem # 1:  ESSENTIAL HYPERTENSION, BENIGN (ICD-401.1) BP still elevated Has not taken meds Return on Friday for BP recheck, BMET - to take meds  Her updated medication list for this problem includes:    Dyazide 37.5-25 Mg Caps (Triamterene-hctz) .Marland Kitchen... 1 tab by mouth daily  Complete Medication List: 1)  Dyazide 37.5-25 Mg Caps (Triamterene-hctz) .Marland Kitchen.. 1 tab by mouth daily 2)  Natures Tears Soln (Artificial tear solution) .... Use as needed 3)  Protonix 40 Mg Solr (Pantoprazole sodium) .... Take one tab daily   Patient Instructions: 1)  Patient to f/u for another BP check and have bmet drawn on 10/19/10. 2)  Patient's daughter interpreting for patient and she was instructed to have patient take her BP med at least 1 hour prior to nurse visit.Pt. denies any problems and will call if has any problems/questions.   CC: Pt. here for BP check and BMET. Pt. has not taken BP med today. Is Patient Diabetic? No Pain Assessment Patient in pain? no       Does patient need assistance? Functional Status Self care Ambulation Normal Pt. with no c/o's. Pt. to return on 10/19/10 for another BP check and will have her labs drawn  at that time.Pt. instructed via daughter to take BP med at least 1 hour prior to visit on Friday.  Allergies: No Known Drug Allergies  Orders Added: 1)  Est. Patient Level I [11914]

## 2010-10-24 ENCOUNTER — Encounter (INDEPENDENT_AMBULATORY_CARE_PROVIDER_SITE_OTHER): Payer: Self-pay | Admitting: Internal Medicine

## 2010-10-24 ENCOUNTER — Encounter: Payer: Self-pay | Admitting: Internal Medicine

## 2010-10-24 DIAGNOSIS — E669 Obesity, unspecified: Secondary | ICD-10-CM

## 2010-10-24 HISTORY — DX: Obesity, unspecified: E66.9

## 2010-10-25 ENCOUNTER — Encounter (INDEPENDENT_AMBULATORY_CARE_PROVIDER_SITE_OTHER): Payer: Self-pay | Admitting: Internal Medicine

## 2010-10-25 LAB — CONVERTED CEMR LAB
Calcium: 9.7 mg/dL (ref 8.4–10.5)
Glucose, Bld: 99 mg/dL (ref 70–99)
Sodium: 143 meq/L (ref 135–145)

## 2010-10-30 NOTE — Assessment & Plan Note (Signed)
Summary: BMET  Nurse Visit   Allergies: No Known Drug Allergies  Orders Added: 1)  Est. Patient Level I [84696] 2)  T-Basic Metabolic Panel [29528-[address]24]

## 2010-10-30 NOTE — Assessment & Plan Note (Signed)
Summary: Nurse Visit BP  Nurse Visit   Vital Signs:  Patient profile:   55 year old female Menstrual status:  postmenopausal Pulse rate:   73 / minute Pulse rhythm:   regular BP sitting:   160 / 94  (left arm) Cuff size:   large  Vitals Entered By: Gaylyn Cheers RN (October 24, 2010 9:20 AM)  Primary Care Provider:  Tereso Newcomer PA-C  CC:  Here for BP check.  History of Present Illness: No complaints of chest pain, has occasional SOB after activity, Lt ankle slightly swollen, Having HA's 3 times per wk. Discussed with Jesse Fall FNP.   Impression & Recommendations:  Problem # 1:  ESSENTIAL HYPERTENSION, BENIGN (ICD-401.1) BP is still elevated. will start lisinopril 10mg  by mouth daily in addition to dyazide Her updated medication list for this problem includes:    Dyazide 37.5-25 Mg Caps (Triamterene-hctz) .Marland Kitchen... 1 tab by mouth daily    Lisinopril 10 Mg Tabs (Lisinopril) ..... One tablet by mouth daily for blood pressure  Problem # 2:  OBESITY (ICD-278.00) weight has gone up advised pt to restart exercise  Complete Medication List: 1)  Dyazide 37.5-25 Mg Caps (Triamterene-hctz) .Marland Kitchen.. 1 tab by mouth daily 2)  Natures Tears Soln (Artificial tear solution) .... Use as needed 3)  Protonix 40 Mg Solr (Pantoprazole sodium) .... Take one tab daily 4)  Lisinopril 10 Mg Tabs (Lisinopril) .... One tablet by mouth daily for blood pressure   Patient Instructions: 1)  Blood pressure check in 4 weeks. 2)  Start lisinopril 10mg  by mouth daily in addition to dyazide. 3)  You should work on exercise and weight control in an effort to help lower blood pressure  CC: Here for BP check   Allergies: No Known Drug Allergies  Orders Added: 1)  Est. Patient Level I [[address]] Prescriptions: LISINOPRIL 10 MG TABS (LISINOPRIL) One tablet by mouth daily for blood pressure  #30 x 1   Entered by:   Gaylyn Cheers RN   Authorized by:   Lehman Prom FNP   Signed by:   Gaylyn Cheers RN on  10/24/2010   Method used:   Print then Give to Patient   RxID:   [id number]

## 2010-11-13 LAB — DIFFERENTIAL
Basophils Relative: 0 % (ref 0–1)
Eosinophils Absolute: 0.2 10*3/uL (ref 0.0–0.7)
Lymphs Abs: 2 10*3/uL (ref 0.7–4.0)
Neutro Abs: 3.5 10*3/uL (ref 1.7–7.7)
Neutrophils Relative %: 58 % (ref 43–77)

## 2010-11-13 LAB — GRAM STAIN: Gram Stain: NONE SEEN

## 2010-11-13 LAB — COMPREHENSIVE METABOLIC PANEL
CO2: 27 mEq/L (ref 19–32)
Calcium: 9.6 mg/dL (ref 8.4–10.5)
Creatinine, Ser: 0.68 mg/dL (ref 0.4–1.2)
GFR calc Af Amer: >60 mL/min (ref >60)
GFR calc non Af Amer: >60 mL/min (ref >60)
Glucose, Bld: 104 mg/dL — ABNORMAL HIGH (ref 70–99)
Sodium: 139 mEq/L (ref 135–145)
Total Protein: 7.5 g/dL (ref 6.0–8.3)

## 2010-11-13 LAB — CBC
HCT: 40.8 % (ref 36.0–46.0)
MCHC: 34.8 g/dL (ref 30.0–36.0)
MCV: 87.6 fL (ref 78.0–100.0)
Platelets: 217 10*3/uL (ref 150–400)
RBC: 4.66 MIL/uL (ref 3.87–5.11)
WBC: 5.9 10*3/uL (ref 4.0–10.5)

## 2010-11-13 LAB — BODY FLUID CULTURE
Culture: NO GROWTH
Gram Stain: NONE SEEN

## 2010-11-13 LAB — ANAEROBIC CULTURE

## 2010-11-22 ENCOUNTER — Other Ambulatory Visit (HOSPITAL_COMMUNITY): Payer: Self-pay | Admitting: Internal Medicine

## 2010-11-22 ENCOUNTER — Ambulatory Visit (HOSPITAL_COMMUNITY)
Admission: RE | Admit: 2010-11-22 | Discharge: 2010-11-22 | Disposition: A | Discharge status: Home / Self Care | Payer: Self-pay | Source: Ambulatory Visit | Attending: Internal Medicine | Admitting: Internal Medicine

## 2010-11-22 DIAGNOSIS — R52 Pain, unspecified: Secondary | ICD-10-CM

## 2010-11-22 DIAGNOSIS — M25569 Pain in unspecified knee: Secondary | ICD-10-CM | POA: Insufficient documentation

## 2010-11-22 DIAGNOSIS — IMO0002 Reserved for concepts with insufficient information to code with codable children: Secondary | ICD-10-CM | POA: Insufficient documentation

## 2010-11-22 DIAGNOSIS — M1710 Unilateral primary osteoarthritis, unspecified knee: Secondary | ICD-10-CM | POA: Insufficient documentation

## 2010-11-27 LAB — POCT I-STAT, CHEM 8
Creatinine, Ser: 0.7 mg/dL (ref 0.4–1.2)
HCT: 42 % (ref 36.0–46.0)
Hemoglobin: 14.3 g/dL (ref 12.0–15.0)
Potassium: 3.9 mEq/L (ref 3.5–5.1)

## 2011-02-18 ENCOUNTER — Other Ambulatory Visit (HOSPITAL_COMMUNITY): Payer: Self-pay | Admitting: Family Medicine

## 2011-06-13 ENCOUNTER — Observation Stay (HOSPITAL_COMMUNITY)
Admission: EM | Admit: 2011-06-13 | Discharge: 2011-06-14 | Disposition: A | Discharge status: Home / Self Care | Payer: Self-pay | Attending: Emergency Medicine | Admitting: Emergency Medicine

## 2011-06-13 DIAGNOSIS — M79609 Pain in unspecified limb: Principal | ICD-10-CM | POA: Insufficient documentation

## 2011-06-13 DIAGNOSIS — R109 Unspecified abdominal pain: Secondary | ICD-10-CM | POA: Insufficient documentation

## 2011-06-13 DIAGNOSIS — I10 Essential (primary) hypertension: Secondary | ICD-10-CM | POA: Insufficient documentation

## 2011-06-13 DIAGNOSIS — K219 Gastro-esophageal reflux disease without esophagitis: Secondary | ICD-10-CM | POA: Insufficient documentation

## 2011-06-14 DIAGNOSIS — M79609 Pain in unspecified limb: Secondary | ICD-10-CM

## 2011-06-14 DIAGNOSIS — M7989 Other specified soft tissue disorders: Secondary | ICD-10-CM

## 2011-06-14 LAB — URINALYSIS, ROUTINE W REFLEX MICROSCOPIC
Glucose, UA: NEGATIVE mg/dL
Ketones, ur: NEGATIVE mg/dL
Nitrite: NEGATIVE
Protein, ur: NEGATIVE mg/dL

## 2011-06-14 LAB — CBC
HCT: 40.6 % (ref 36.0–46.0)
Hemoglobin: 14.1 g/dL (ref 12.0–15.0)
MCH: 30 pg (ref 26.0–34.0)
MCHC: 34.7 g/dL (ref 30.0–36.0)
RBC: 4.7 MIL/uL (ref 3.87–5.11)

## 2011-06-14 LAB — DIFFERENTIAL
Lymphocytes Relative: 42 % (ref 12–46)
Lymphs Abs: 2.6 10*3/uL (ref 0.7–4.0)
Monocytes Absolute: 0.4 10*3/uL (ref 0.1–1.0)
Monocytes Relative: 7 % (ref 3–12)
Neutro Abs: 3 10*3/uL (ref 1.7–7.7)

## 2011-06-14 LAB — POCT I-STAT, CHEM 8
BUN: 17 mg/dL (ref 6–23)
Calcium, Ion: 1.19 mmol/L (ref 1.12–1.32)
Chloride: 102 meq/L (ref 96–112)
Creatinine, Ser: 0.9 mg/dL (ref 0.50–1.10)
Glucose, Bld: 99 mg/dL (ref 70–99)
HCT: 43 % (ref 36.0–46.0)
Hemoglobin: 14.6 g/dL (ref 12.0–15.0)
Potassium: 3.7 mEq/L (ref 3.5–5.1)
Sodium: 139 meq/L (ref 135–145)
TCO2: 27 mmol/L (ref 0–100)

## 2011-06-14 LAB — URINE MICROSCOPIC-ADD ON

## 2011-06-15 LAB — URINE CULTURE: Colony Count: NO GROWTH

## 2011-06-18 ENCOUNTER — Encounter: Payer: Self-pay | Admitting: *Deleted

## 2013-01-15 ENCOUNTER — Other Ambulatory Visit: Payer: Self-pay | Admitting: Family Medicine

## 2013-01-15 ENCOUNTER — Ambulatory Visit: Payer: Self-pay | Attending: Family Medicine | Admitting: Family Medicine

## 2013-01-15 VITALS — BP 152/81 | HR 64 | Temp 98.6°F | Resp 16 | Ht 61.5 in | Wt 249.0 lb

## 2013-01-15 DIAGNOSIS — K219 Gastro-esophageal reflux disease without esophagitis: Secondary | ICD-10-CM

## 2013-01-15 DIAGNOSIS — H9319 Tinnitus, unspecified ear: Secondary | ICD-10-CM

## 2013-01-15 DIAGNOSIS — I8390 Asymptomatic varicose veins of unspecified lower extremity: Secondary | ICD-10-CM

## 2013-01-15 DIAGNOSIS — K8020 Calculus of gallbladder without cholecystitis without obstruction: Secondary | ICD-10-CM

## 2013-01-15 DIAGNOSIS — E669 Obesity, unspecified: Secondary | ICD-10-CM

## 2013-01-15 DIAGNOSIS — Z86010 Personal history of colonic polyps: Secondary | ICD-10-CM

## 2013-01-15 DIAGNOSIS — I10 Essential (primary) hypertension: Secondary | ICD-10-CM

## 2013-01-15 DIAGNOSIS — I8393 Asymptomatic varicose veins of bilateral lower extremities: Secondary | ICD-10-CM

## 2013-01-15 DIAGNOSIS — R51 Headache: Secondary | ICD-10-CM | POA: Insufficient documentation

## 2013-01-15 DIAGNOSIS — M25569 Pain in unspecified knee: Secondary | ICD-10-CM

## 2013-01-15 LAB — COMPREHENSIVE METABOLIC PANEL
Albumin: 4.1 g/dL (ref 3.5–5.2)
Alkaline Phosphatase: 71 U/L (ref 39–117)
BUN: 13 mg/dL (ref 6–23)
CO2: 27 mEq/L (ref 19–32)
Calcium: 9.4 mg/dL (ref 8.4–10.5)
Chloride: 104 mEq/L (ref 96–112)
Glucose, Bld: 90 mg/dL (ref 70–99)
Potassium: 4.5 mEq/L (ref 3.5–5.3)
Sodium: 139 mEq/L (ref 135–145)
Total Protein: 7.2 g/dL (ref 6.0–8.3)

## 2013-01-15 LAB — LIPID PANEL
Cholesterol: 169 mg/dL (ref 0–200)
HDL: 42 mg/dL (ref >39)
Total CHOL/HDL Ratio: 4 Ratio
Triglycerides: 176 mg/dL — ABNORMAL HIGH (ref <150)
VLDL: 35 mg/dL (ref 0–40)

## 2013-01-15 LAB — CBC
Hemoglobin: 14 g/dL (ref 12.0–15.0)
MCH: 29.5 pg (ref 26.0–34.0)
Platelets: 230 10*3/uL (ref 150–400)
RBC: 4.75 MIL/uL (ref 3.87–5.11)
WBC: 5.3 10*3/uL (ref 4.0–10.5)

## 2013-01-15 LAB — TSH: TSH: 3.336 u[IU]/mL (ref 0.350–4.500)

## 2013-01-15 MED ORDER — METOPROLOL SUCCINATE ER 50 MG PO TB24: 50.0000 mg | ORAL_TABLET | Freq: Every day | ORAL | Status: DC

## 2013-01-15 MED ORDER — HYDROCHLOROTHIAZIDE 12.5 MG PO TABS: 12.5000 mg | ORAL_TABLET | Freq: Every day | ORAL | Status: DC

## 2013-01-15 NOTE — Progress Notes (Signed)
Patient ID: Anita [name], female   DOB: 04/02/1956, 57 y.o.   MRN: [id number]  CC: establish  HPI: Pt didn't bring any of her medical records. She does not know what pharmacy she uses or what medications she was taking. She hasn't been seen by a provider for over 1 year.  She reports that she feels well. No complaints except occasional headaches.   Translator used to communicate with patient today.   No Known Allergies Past Medical History  Diagnosis Date  . Hypertension   . Allergy    No current outpatient prescriptions on file prior to visit.   No current facility-administered medications on file prior to visit.   Family History  Problem Relation Age of Onset  . Diabetes Paternal Grandmother   . Hypertension Paternal Grandmother    History   Social History  . Marital Status: Married    Spouse Name: N/A    Number of Children: N/A  . Years of Education: N/A   Occupational History  . Not on file.   Social History Main Topics  . Smoking status: Never Smoker   . Smokeless tobacco: Not on file  . Alcohol Use: No  . Drug Use: No  . Sexually Active: Not on file   Other Topics Concern  . Not on file   Social History Narrative  . No narrative on file    Review of Systems  Constitutional: Negative for fever, chills, diaphoresis, activity change, appetite change and fatigue.  HENT: Negative for ear pain, nosebleeds, congestion, facial swelling, rhinorrhea, neck pain, neck stiffness and ear discharge.   Eyes: Negative for pain, discharge, redness, itching and visual disturbance.  Respiratory: Negative for cough, choking, chest tightness, shortness of breath, wheezing and stridor.   Cardiovascular: Negative for chest pain, palpitations and leg swelling.  Gastrointestinal: Negative for abdominal distention.  Genitourinary: Negative for dysuria, urgency, frequency, hematuria, flank pain, decreased urine volume, difficulty urinating and dyspareunia.  Musculoskeletal:  Negative for back pain, joint swelling, arthralgias and gait problem.  Neurological: Negative for dizziness, tremors, seizures, syncope, facial asymmetry, speech difficulty, weakness, light-headedness, numbness and reports some headaches.  Hematological: Negative for adenopathy. Does not bruise/bleed easily.  Psychiatric/Behavioral: Negative for hallucinations, behavioral problems, confusion, dysphoric mood, decreased concentration and agitation.    Objective:   Filed Vitals:   01/15/13 0955  BP: 152/81  Pulse: 64  Temp: 98.6 F (37 C)  Resp: 16    Physical Exam  Constitutional: Appears well-developed and well-nourished obese female. No distress.  HENT: Normocephalic. External right and left ear normal. Oropharynx is clear and moist.  Eyes: Conjunctivae and EOM are normal. PERRLA, no scleral icterus.  Neck: Normal ROM. Neck supple. No JVD. No tracheal deviation. No thyromegaly.  CVS: RRR, S1/S2 +, no murmurs, no gallops, no carotid bruit.  Pulmonary: Effort and breath sounds normal, no stridor, rhonchi, wheezes, rales.  Abdominal: Soft. BS +,  no distension, tenderness, rebound or guarding.  Musculoskeletal: Normal range of motion. No edema and no tenderness.  Lymphadenopathy: No lymphadenopathy noted, cervical, inguinal. Neuro: Alert. Normal reflexes, muscle tone coordination. No cranial nerve deficit. Skin: Skin is warm and dry. Hyperpigmented lichenified skin rash on left lower leg and ankle, Not diaphoretic. No erythema. No pallor.  Psychiatric: Normal mood and affect. Behavior, judgment, thought content normal.   Lab Results  Component Value Date   WBC 6.2 06/14/2011   HGB 14.6 06/14/2011   HCT 43.0 06/14/2011   MCV 86.4 06/14/2011   PLT 188 06/14/2011  Lab Results  Component Value Date   CREATININE 0.90 06/14/2011   BUN 17 06/14/2011   NA 139 06/14/2011   K 3.7 06/14/2011   CL 102 06/14/2011   CO2 28 10/25/2010    No results found for this basename: HGBA1C   Lipid Panel      Component Value Date/Time   CHOL 180 05/19/2009 2210   TRIG 134 05/19/2009 2210   HDL 48 05/19/2009 2210   CHOLHDL 3.8 Ratio 05/19/2009 2210   VLDL 27 05/19/2009 2210   LDLCALC 105* 05/19/2009 2210       Assessment and plan:   Patient Active Problem List   Diagnosis Date Noted  . OBESITY 10/24/2010  . TINNITUS, CHRONIC, RIGHT 09/07/2010  . TOOTH LOSS 09/07/2010  . COLONIC POLYPS, HX OF 05/09/2010  . ALLERGIC RHINITIS 12/04/2009  . 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 ADJUSTMENT DISORDER WITH DEPRESSED MOOD 08/30/2009  . GERD 06/12/2009  . KNEE PAIN 06/12/2009  . LIVER MASS 05/30/2009  . CHOLELITHIASIS 05/30/2009  . ESSENTIAL HYPERTENSION, BENIGN 05/05/2009  . 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 ABDOMINAL PAIN 05/05/2009   Check labs today Pt will bring her medical records from home so that we can review and send her medication refills to the pharmacy.  Her daughter says that she will bring them right away later this morning.   Follow lab results.   RTC in 2 weeks for BP check up  The patient was given clear instructions to go to ER or return to medical center if symptoms don't improve, worsen or new problems develop.  The patient verbalized understanding.  The patient was told to call to get lab results if they haven't heard anything in the next week.    Rodney Langton, MD, CDE, FAAFP [facility] [place], [place]

## 2013-01-15 NOTE — Patient Instructions (Signed)
Hipertensión  (Hypertension)  Cuando el corazón late bombea la sangre a través de las arterias. La fuerza que se origina es la presión arterial. Si la presión es demasiado elevada, se denomina hipertensión. El peligro radica en que puede sufrirla y no saberlo. Hipertensión puede significar que su corazón debe trabajar más intensamente para bombear sangre. Las arterias pueden estar estrechas o rígidas. El trabajo extra aumenta el riesgo de enfermedades cardíacas, ictus y otros problemas.   La presión arterial está formada por dos números: el número mayor sobre el número menor, por ejemplo110/70. Se señala "110/72". Los valores ideales son por debajo de 120 para el número más alto (sistólica) y por debajo de 80 para el más bajo (diastólica). Anote su presión sanguínea hoy.  Debe prestar mucha atención a su presión arterial si sufre alguna otra enfermedad como:  · Insuficiencia cardíaca  · Ataques cardiacos previos  · Diabetes  · Enfermedad renal crónica  · Ictus previo  · Múltiples factores de riesgo para enfermedades cardíacas  Para diagnosticar si usted sufre hipertensión arterial, debe medirse la presión mientras encuentra sentado con el brazo elevado a la altura del nivel del corazón. Debe medirse al menos 2 veces. Una única lectura de presión arterial elevada (especialmente en el servicio de emergencias) no significa que necesita tratamiento. Hay enfermedades en las que la presión arterial es diferente en ambos brazos. Es importante que consulte rápidamente con su médico para un control.  La mayoría de las personas sufren hipertensión esencial, lo que significa que no tiene una causa específica. Este tipo de hipertensión puede bajarse modificando algunos factores en el estilo de vida como:  · Estrés.  · El consumo de cigarrillos.  · La falta de actividad física.  · Peso excesivo  · Consumo de drogas y alcohol.  · Consumiendo menos sal  La mayoría de las personas no tienen síntomas hasta que la hipertensión  ocasiona un daño en el organismo. El tratamiento efectivo puede evitar, demorar o reducir ese daño.  TRATAMIENTO:  El tratamiento para la hipertensión, cuando se ha identificado una causa, está dirigido a la misma Hay un gran número en medicamentos para tratarla. Se agrupan en diferentes categorías y el médico seleccionará los medicamentos indicados para usted. Muchos medicamentos disponibles tienen efectos secundarios. Debe comentar los efectos secundarios con su médico.  Si la presión arterial permanece elevada después de modificar su estilo de vida o comenzar a tomar medicamentos:  · Los medicamentos deben ser reemplazados  · Puede ser necesario evaluar otros problemas.  · Debe estar seguro que comprende las indicaciones, que sabe cómo y cuándo tomar los medicamentos.  · Asegúrese de realizar un control con su médico dentro del tiempo indicado (generalmente dentro de las dos semanas) para volver a evaluar la presión arterial y revisar los medicamentos prescritos.  · Si está tomando más de un medicamento para la presión arterial, asegúrese que sabe cómo y en qué momentos debe tomarlos. Tomar los medicamentos al mismo tiempo puede dar como resultado un gran descenso en la presión arterial.  SOLICITE ATENCIÓN MÉDICA DE INMEDIATO SI PRESENTA:  · Dolor de cabeza intenso, visión borrosa o cambios en la visión, o confusión.  · Debilidad o adormecimientos inusuales o sensación de desmayo.  · Dolor de pecho o abdominal intenso, vómitos o problemas para respirar.  ASEGÚRESE QUE:   · Comprende estas instrucciones.  · Controlará su enfermedad.  · Solicitará ayuda inmediatamente si no mejora o si empeora.  Document Released: 07/29/2005 Document Revised: 10/21/2011  ExitCare®   Patient Information ©2014 [employer].

## 2013-01-18 ENCOUNTER — Telehealth: Payer: Self-pay | Admitting: *Deleted

## 2013-01-18 NOTE — Telephone Encounter (Signed)
01/18/13 Patient made aware of lab results viatmin D level  Low Will call In prescriptions for Vitamin D 50,000 IU once per week #8 no refills Prescription called into [facility] per patient request . verbalize and understands. P.Shanayah Kaffenberger,RN BSN [facility]

## 2013-01-18 NOTE — Progress Notes (Signed)
Quick Note:  Please inform patient that labs came back okay except that the vitamin D level was low. Please call in prescription for patient for Vit D Drisdol 50,000 IU caps, take 1 po once per week, #8, no refills. We should recheck vit D level in 4 months.   Rodney Langton, MD, CDE, FAAFP [facility] [place], [place]   ______

## 2014-10-19 ENCOUNTER — Ambulatory Visit (INDEPENDENT_AMBULATORY_CARE_PROVIDER_SITE_OTHER): Payer: Self-pay | Admitting: Emergency Medicine

## 2014-10-19 ENCOUNTER — Ambulatory Visit (HOSPITAL_COMMUNITY)
Admission: RE | Admit: 2014-10-19 | Discharge: 2014-10-19 | Disposition: A | Discharge status: Home / Self Care | Payer: Self-pay | Source: Ambulatory Visit | Attending: Internal Medicine | Admitting: Internal Medicine

## 2014-10-19 DIAGNOSIS — M79605 Pain in left leg: Secondary | ICD-10-CM

## 2014-10-19 DIAGNOSIS — M79662 Pain in left lower leg: Secondary | ICD-10-CM

## 2014-10-19 DIAGNOSIS — M7989 Other specified soft tissue disorders: Secondary | ICD-10-CM | POA: Insufficient documentation

## 2014-10-19 DIAGNOSIS — I8312 Varicose veins of left lower extremity with inflammation: Secondary | ICD-10-CM

## 2014-10-19 DIAGNOSIS — Z6841 Body Mass Index (BMI) 40.0 and over, adult: Secondary | ICD-10-CM | POA: Insufficient documentation

## 2014-10-19 DIAGNOSIS — I872 Venous insufficiency (chronic) (peripheral): Secondary | ICD-10-CM | POA: Insufficient documentation

## 2014-10-19 DIAGNOSIS — R609 Edema, unspecified: Secondary | ICD-10-CM

## 2014-10-19 MED ORDER — CEPHALEXIN 500 MG PO CAPS: 500.0000 mg | ORAL_CAPSULE | Freq: Four times a day (QID) | ORAL | Status: DC

## 2014-10-19 NOTE — Progress Notes (Signed)
Subjective:   Patient ID: Anita [name], female     DOB: [birth year], 59 y.o.    MRN: [id number]  PCP: Irwin Brakeman, MD (last, and only, visit 01/2013)  Chief Complaint  Patient presents with  . Leg Pain    left leg      Prior to Admission medications   Medication Sig Start Date End Date Taking? Authorizing Provider  hydrochlorothiazide (HYDRODIURIL) 12.5 MG tablet Take 1 tablet (12.5 mg total) by mouth daily. 01/15/13  Yes Clanford Marisa Hua, MD  metoprolol succinate (TOPROL XL) 50 MG 24 hr tablet Take 1 tablet (50 mg total) by mouth daily. Take with or immediately following a meal. 01/15/13  Yes Clanford Marisa Hua, MD     No Known Allergies   Patient Active Problem List   Diagnosis Date Noted  . BMI 45.0-49.9, adult 10/19/2014  . OBESITY 10/24/2010  . TINNITUS, CHRONIC, RIGHT 09/07/2010  . TOOTH LOSS 09/07/2010  . COLONIC POLYPS, HX OF 05/09/2010  . ALLERGIC RHINITIS 12/04/2009  . 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 ADJUSTMENT DISORDER WITH DEPRESSED MOOD 08/30/2009  . GERD 06/12/2009  . KNEE PAIN 06/12/2009  . LIVER MASS 05/30/2009  . CHOLELITHIASIS 05/30/2009  . ESSENTIAL HYPERTENSION, BENIGN 05/05/2009  . 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 ABDOMINAL PAIN 05/05/2009     Family History  Problem Relation Age of Onset  . Diabetes Paternal Grandmother   . Hypertension Paternal Grandmother   . Hypertension Sister   . Hyperlipidemia Sister   . Hypertension Brother   . Hyperlipidemia Brother   . Migraines Daughter   . Hypertension Sister   . Hyperlipidemia Sister   . Cancer Sister   . Hypertension Sister   . Hyperlipidemia Sister   . Hypertension Brother   . Hyperlipidemia Brother   . Hypertension Brother   . Hyperlipidemia Brother   . Hypertension Brother   . Hyperlipidemia Brother   . Hypertension Brother   . Hyperlipidemia Brother      History   Social History  . Marital Status: Married    Spouse Name: Freida Busman  . Number of  Children: 5  . Years of Education: 6th grade   Occupational History  . homemaker    Social History Main Topics  . Smoking status: Never Smoker   . Smokeless tobacco: Never Used  . Alcohol Use: No  . Drug Use: No  . Sexual Activity: Not on file   Other Topics Concern  . Not on file   Social History Narrative   From [place], [place]. Came to the [place] 2002. Lives with her husband. Their son and grandson live nearby.     HPI Presents for evaluation of pain and rash of the LEFT leg.  Her grandson translates.  Left leg started turning purplish. Reports that it's uncomfortable, a stinging sensation. Sometimes has pain with standing, but not always. It began as an itchy "spot" about 16 years ago. She recalls that it would sometimes weep "water." Has been slowing worsening until 2 weeks ago when it became much worse, started to sting and cause pain. Ice application, creams without benefit or change.  She denies CP, SOB, HA, dizziness.   Visit 01/2013 with PCP reviewed. [facility]. She was asymptomatic, physical exam was notable for Hyperpigmented lichenified skin rash on left lower leg and ankle and she was started on antihypertensive therapy. Hgb A1c was 5.4%, CMET, TSH and CBC were normal, triglycerides mildly elevated and  Vitamin D was low at 18. She did not follow-up in 2 weeks as planned.   Review of Systems Review of Systems As above.     Objective:  Physical Exam Physical Exam  Constitutional: She is oriented to person, place, and time. She appears well-developed and well-nourished. She is active and cooperative. No distress.  BP 130/80 mmHg  Pulse 62  Temp(Src) 98 F (36.7 C) (Oral)  Resp 17  Ht 5\' 2"  (1.575 m)  Wt 249 lb (112.946 kg)  BMI 45.53 kg/m2  SpO2 99%   Eyes: Conjunctivae are normal. No scleral icterus.  Cardiovascular: Normal rate, regular rhythm and normal heart sounds.   Pulses:      Dorsalis  pedis pulses are 2+ on the right side, and 2+ on the left side.       Posterior tibial pulses are 2+ on the right side, and 2+ on the left side.  Pulmonary/Chest: Effort normal and breath sounds normal.  Musculoskeletal:       Left knee: Normal.       Left ankle: Normal. Achilles tendon normal.       Left lower leg: She exhibits tenderness, swelling and edema. She exhibits no bony tenderness.  Neurological: She is alert and oriented to person, place, and time.  Skin: Skin is warm and dry.     The LEFT lower leg is notable for longstanding venous stasis changes, but with erythema and scattered vasculitic lesions. The extremity is indurated. Medially, just above the ankle there are color changes that are concerning for early breakdown. There are scattered vesicles. Most frequent at the area of concern. Toenails are hypertrophic.  Psychiatric: She has a normal mood and affect. Her speech is normal and behavior is normal.             Assessment & Plan:  1. Left leg pain 2. Left leg swelling Evaluate for DVT. If positive, treat as such with anticoagulation. If negative, treat venous stasis as below and follow-up with PCP in about 1 week. - Lower Extremity Venous Duplex Left; Future  3. Venous stasis dermatitis of left lower extremity Concern for cellulitis. Moderate support knee-high compression stockings on QAM, off QPM. May need [employer]nas Boot. - cephALEXin (KEFLEX) 500 MG capsule; Take 1 capsule (500 mg total) by mouth 4 (four) times daily.  Dispense: 40 capsule; Refill: 0  Examined with Dr. Everlene Farrier.   Fara Chute, PA-C Physician Assistant-Certified [facility]

## 2014-10-19 NOTE — Progress Notes (Signed)
Left Lower Extremity venous Duplex Completed. Negative for DVT or SVT.  Oda Cogan, BS, RDMS, RVT

## 2014-10-19 NOTE — Progress Notes (Signed)
   Subjective:    Patient ID: Anita [name], female    DOB: 1955/10/14, 59 y.o.   MRN: [id number]  HPI    Review of Systems     Objective:   Physical Exam        Assessment & Plan:

## 2014-10-19 NOTE — Patient Instructions (Addendum)
For the Ultrasound of the leg: Please report at 3pm today at [facility] for an Scheduled Venous Doppler *Check in with the front desk and they will take care of you Please wait there until you are given the results and instructions for what to do next.  Take the antibiotic for 10 days.  Get a pair of moderate support stockings (20 mmHg). You can find them at most stores that sell hosiery, and at [employer].  Follow-up with your primary care provider [facility] Wynetta Emery, MD) in about 1 week for additional evaluation and treatment.

## 2014-10-25 ENCOUNTER — Telehealth: Payer: Self-pay | Admitting: Urgent Care

## 2014-10-25 NOTE — Telephone Encounter (Signed)
Was unable to reach patient to communicate results of [place].  Jaynee Eagles, PA-C [facility] [phone] 10/25/2014  2:35 PM

## 2014-10-25 NOTE — Telephone Encounter (Signed)
-----   Message from Anthoney Harada sent at 10/24/2014 11:24 AM EDT ----- Need your help on this one patient does not speak english

## 2014-10-28 NOTE — Telephone Encounter (Signed)
Notified patient of negative [place] results. Patient verbalized understanding, advised to return to clinic if symptoms persisted.  Jaynee Eagles, PA-C [facility] [phone] 10/28/2014  8:58 AM

## 2015-09-25 ENCOUNTER — Encounter: Payer: Self-pay | Admitting: Gastroenterology

## 2019-03-10 ENCOUNTER — Ambulatory Visit
Admission: EM | Admit: 2019-03-10 | Discharge: 2019-03-10 | Disposition: A | Discharge status: Home / Self Care | Payer: Self-pay | Attending: Family Medicine | Admitting: Family Medicine

## 2019-03-10 DIAGNOSIS — R42 Dizziness and giddiness: Secondary | ICD-10-CM

## 2019-03-10 DIAGNOSIS — I10 Essential (primary) hypertension: Secondary | ICD-10-CM

## 2019-03-10 LAB — POCT FASTING CBG KUC MANUAL ENTRY: POCT Glucose (KUC): 104 mg/dL — AB (ref 70–99)

## 2019-03-10 MED ORDER — METOPROLOL SUCCINATE ER 25 MG PO TB24: 25.0000 mg | ORAL_TABLET | Freq: Every day | ORAL | 0 refills | Status: DC

## 2019-03-10 NOTE — ED Provider Notes (Signed)
Anita [name]   [id number] 03/10/19 Arrival Time: 1006  ASSESSMENT & PLAN:  1. Intermittent lightheadedness   2. Uncontrolled hypertension     Normal neurologic exam. No suspicion for ICH or SAH. No indication for neurodiagnostic imaging at this time. Discussed.  ECG: Does exhibit changes when compared to 2011 poor quality ECG scan in Epic. NSR. T-wave changes seen in anterior leads. Also with new bifascicular block. No STEMI. (Physical copy given to patient.) Capillary glucose: slightly elevated at 104. May recheck fasting here at any time.  To begin. Meds ordered this encounter  Medications   metoprolol succinate (TOPROL-XL) 25 MG 24 hr tablet    Sig: Take 1 tablet (25 mg total) by mouth daily.    Dispense:  30 tablet    Refill:  0   Recommend: Follow-up Information    Schedule an appointment as soon as possible for a visit  with [place] [facility].   Specialty: Cardiology Contact information: [address] [place] [phone]           Discharge Instructions     You have been seen at the [facility] today for occasionally feeling dizzy. Although I cannot say exactly why you are having these sporadic episodes of dizziness, your evaluation today was not suggestive of any emergent condition requiring medical intervention at this moment. Your ECG (heart tracing) did show changes when compared to your previous one in 2011. This does not mean that your current symptoms are caused by your heart but it does mean that you need to see a heart specialist. Please call the number given to schedule an follow up appointment with a cardiologist in order to discuss any further testing that may need to be done. It is very important that you pay attention to any new symptoms or worsening of your current condition. Your blood pressure was also noted to be high. Please begin taking the blood pressure medicine sent to  your pharmacy. You are welcome to follow up here to recheck your blood pressure at any time.  Please proceed directly to the Emergency Department immediately should you feel worse in any way or have any of the following symptoms: increasing or different chest [name], [name] that spreads to your arm, neck, jaw, back or abdomen, shortness of breath, or nausea and vomiting.   She and daughter voice understanding of discharge instructions.  Reviewed expectations re: course of current medical issues. Questions answered. Outlined signs and symptoms indicating need for more acute intervention. Patient verbalized understanding. After Visit Summary given.   SUBJECTIVE: Declines interpreter. Prefers her daughter to interpret for her.  Anita [name] is a 63 y.o. female who presents for evaluation of intermittent "feeling dizzy" described as lightheadedness. Non-positional. Has noticed once or twice over the past few weeks. Transient. Typically lasts a few minutes then resolves. No specific aggravating or alleviating factors reported. No precipitating factors identified. Describes "sensation of heat coming from her legs and spreading to her head." No associated headache/visual problems/CP/palipations/SOB/diaphoresis/n/v/LE edema. No new medications. Reports "more intense" episode this morning that lasted a few minutes then resolved without recurrence. Previous workup/treatments: none. Denies: aural pressure, otalgia, hearing loss. Does reports long-standing tinnitus of R ear that has not worsened. Daughter denies noticing any confusion, drowsiness, paresthesias, personality change, speech change and unconsciousness. Recent illnesses: none. Head trauma: none. Drug ingestion: none. Noise exposure: none.   Increased blood pressure noted today. Reports that she has been treated  for hypertension in the past. Stopped taking HTN medications 6-7 years ago; her choice.  She reports no chest [name] on exertion, no  dyspnea on exertion, no swelling of ankles, no orthopnea or paroxysmal nocturnal dyspnea, no intermittent claudication symptoms.  Social History   Tobacco Use  Smoking Status Never Smoker  Smokeless Tobacco Never Used   ROS: As per HPI. All other systems negative.    OBJECTIVE:  Vitals:   03/10/19 1026  BP: (!) 202/99  Pulse: 96  Resp: 18  Temp: 98.2 F (36.8 C)  TempSrc: Oral  SpO2: 96%    General appearance: alert; no distress Eyes: PERRLA; EOMI; conjunctiva normal HENT: normocephalic; atraumatic; TMs normal; nasal mucosa normal; oral mucosa normal Neck: supple with FROM Lungs: clear to auscultation bilaterally Heart: regular rate and rhythm Abdomen: soft, non-tender; bowel sounds normal Extremities: no edema; chronic venous stasis skin changes (L>R); varicose veins on RLE Skin: warm and dry Neurologic: normal gait; DTR's normal and symmetric; CN 2-12 grossly intact; normal speech Psychological: alert and cooperative; normal mood and affect  No Known Allergies  Past Medical History:  Diagnosis Date   Allergy    Hypertension    Social History   Socioeconomic History   Marital status: Married    Spouse name: Freida Busman   Number of children: 5   Years of education: 6th grade   Highest education level: Not on file  Occupational History   Occupation: homemaker  Social Designer, fashion/clothing strain: Not on file   Food insecurity    Worry: Not on file    Inability: Not on file   Transportation needs    Medical: Not on file    Non-medical: Not on file  Tobacco Use   Smoking status: Never Smoker   Smokeless tobacco: Never Used  Substance and Sexual Activity   Alcohol use: No    Alcohol/week: 0.0 standard drinks   Drug use: No   Sexual activity: Not on file  Lifestyle   Physical activity    Days per week: Not on file    Minutes per session: Not on file   Stress: Not on file  Relationships   Social connections    Talks on phone: Not  on file    Gets together: Not on file    Attends religious service: Not on file    Active member of club or organization: Not on file    Attends meetings of clubs or organizations: Not on file    Relationship status: Not on file   Intimate partner violence    Fear of current or ex partner: Not on file    Emotionally abused: Not on file    Physically abused: Not on file    Forced sexual activity: Not on file  Other Topics Concern   Not on file  Social History Narrative   From [place], [place]. Came to the [place] 2002. Lives with her husband. Their son and grandson live nearby.   Family History  Problem Relation Age of Onset   Hypertension Sister    Hyperlipidemia Sister    Hypertension Brother    Hyperlipidemia Brother    Migraines Daughter    Hypertension Sister    Hyperlipidemia Sister    Cancer Sister    Hypertension Sister    Hyperlipidemia Sister    Hypertension Brother    Hyperlipidemia Brother    Hypertension Brother    Hyperlipidemia Brother    Hypertension Brother    Hyperlipidemia Brother  Hypertension Brother    Hyperlipidemia Brother    Diabetes Paternal Grandmother    Hypertension Paternal Grandmother    Past Surgical History:  Procedure Laterality Date   ABDOMINAL HYSTERECTOMY     CESAREAN SECTION        Vanessa Kick, MD 03/10/19 1133

## 2019-03-10 NOTE — Discharge Instructions (Addendum)
You have been seen at the [facility] today for occasionally feeling dizzy. Ythough I cannot say exactly why you are having these sporadic episodes of dizziness, your evaluation today was not suggestive of any emergent condition requiring medical intervention at this moment. Your ECG (heart tracing) did show changes when compared to your previous one in 2011. This does not mean that your current symptoms are caused by your heart but it does mean that you need to see a heart specialist. Please call the number given to schedule an follow up appointment with a cardiologist in order to discuss any further testing that may need to be done. It is very important that you pay attention to any new symptoms or worsening of your current condition. Your blood pressure was also noted to be high. Please begin taking the blood pressure medicine sent to your pharmacy. You are welcome to follow up here to recheck your blood pressure at any time.  Please proceed directly to the Emergency Department immediately should you feel worse in any way or have any of the following symptoms: increasing or different chest pain, pain that spreads to your arm, neck, jaw, back or abdomen, shortness of breath, or nausea and vomiting.

## 2019-03-10 NOTE — ED Triage Notes (Signed)
Pt is non english speaking, per her daughter pt woke up this am feeling dizzy and hot. Pt denies any other problems at this time. Pt states hx of b/p but not meds in 1yrs

## 2019-03-11 ENCOUNTER — Telehealth: Payer: Self-pay | Admitting: Radiology

## 2019-03-11 ENCOUNTER — Ambulatory Visit (INDEPENDENT_AMBULATORY_CARE_PROVIDER_SITE_OTHER): Payer: Self-pay | Admitting: Cardiology

## 2019-03-11 ENCOUNTER — Other Ambulatory Visit: Payer: Self-pay

## 2019-03-11 ENCOUNTER — Telehealth: Payer: Self-pay | Admitting: *Deleted

## 2019-03-11 VITALS — BP 208/96 | HR 68 | Ht 62.0 in | Wt 247.6 lb

## 2019-03-11 DIAGNOSIS — R42 Dizziness and giddiness: Secondary | ICD-10-CM

## 2019-03-11 DIAGNOSIS — R55 Syncope and collapse: Secondary | ICD-10-CM

## 2019-03-11 DIAGNOSIS — I10 Essential (primary) hypertension: Secondary | ICD-10-CM

## 2019-03-11 DIAGNOSIS — Z8249 Family history of ischemic heart disease and other diseases of the circulatory system: Secondary | ICD-10-CM

## 2019-03-11 MED ORDER — CARVEDILOL 25 MG PO TABS: 25.0000 mg | ORAL_TABLET | Freq: Two times a day (BID) | ORAL | 3 refills | Status: DC

## 2019-03-11 MED ORDER — CARVEDILOL 6.25 MG PO TABS: 6.2500 mg | ORAL_TABLET | Freq: Two times a day (BID) | ORAL | 3 refills | Status: DC

## 2019-03-11 MED ORDER — CHLORTHALIDONE 25 MG PO TABS: 25.0000 mg | ORAL_TABLET | Freq: Every day | ORAL | 3 refills | Status: DC

## 2019-03-11 NOTE — Patient Instructions (Addendum)
Medication Instructions:  Your physician has recommended you make the following change in your medication:   1. Stop Metoprolol Succinate (Toprol XL)  2. Begin Chlorthalidone, 25mg  tablet, one tablet per day.  3. Begin Carvedilol, 6.25mg  tablet, one tablet at breakfast, one tablet at dinner.   Labwork: You will have labs drawn today: BMP  Please have an additional BMP drawn when you come in for your hypertension clinic appointment.    Testing/Procedures: Your physician has requested that you have a lexiscan myoview. For further information please visit [email or web address]. Please follow instruction sheet, as given.  Your physician has requested that you have an echocardiogram. Echocardiography is a painless test that uses sound waves to create images of your heart. It provides your doctor with information about the size and shape of your heart and how well your heart's chambers and valves are working. This procedure takes approximately one hour. There are no restrictions for this procedure.  Your physician has recommended that you wear an event monitor. Event monitors are medical devices that record the heart's electrical activity. Doctors most often [place] these monitors to diagnose arrhythmias. Arrhythmias are problems with the speed or rhythm of the heartbeat. The monitor is a small, portable device. You can wear one while you do your normal daily activities. This is usually used to diagnose what is causing palpitations/syncope (passing out).  Your physician has recommended that you have a home sleep study. This test records several body functions during sleep, including: brain activity, eye movement, oxygen and carbon dioxide blood levels, heart rate and rhythm, breathing rate and rhythm, the flow of air through your mouth and nose, snoring, body muscle movements, and chest and belly movement.   Follow-Up:  Please schedule an appointment with our hypertension clinic in one week.   Please  schedule your repeat BMP for the same day as your hypertension clinic appointment.  Any Other Special Instructions Will Be Listed Below (If Applicable).     If you need a refill on your cardiac medications before your next appointment, please call your pharmacy.

## 2019-03-11 NOTE — Progress Notes (Signed)
Cardiology Office Note    Date:  03/11/2019   ID:  Anita [name], Anita [name] August 17, 1955, MRN [id number]  PCP:  Patient, No Pcp Per  Cardiologist:  Fransico Him, MD   Chief Complaint  Patient presents with  . New Patient (Initial Visit)    dizziness    History of Present Illness:  Anita [name] is a 63 y.o. female who is being seen today for the evaluation of lightheadedness and dizziness at the request of Vanessa Kick, MD from [facility].  This is a 63yo female who presents today for evaluation of lightheadedness and uncontrolled HTN.  She apparently has been having intermittent dizziness that she describes as lightheadedness for the past few weeks.  It only lasts a few minutes and then resolves.  She notices it mainly when she goes to stand up but she had an episode sitting down yesterday.  She says that it feels like heat is coming from her legs and spreading to her head.  There are no associated sx of CP, SOB, DOE, PND, orthopnea, LE edema, palpitations or syncope. She has a hx of right ear tinnitus.  Stopped taking BP meds 6-7 years go.  She does have a family hx of CAD in her father who had CABG and her mom had a DCM.   In urgent care the EKG showed a new bifascicular block and is now referred for evaluation.    Past Medical History:  Diagnosis Date  . ABDOMINAL [name] 05/05/2009   Qualifier: Diagnosis of  By: Jorene Minors, Scott    . Adjustment disorder with depressed mood 08/30/2009   Qualifier: Diagnosis of  By: Jorene Minors, Scott    . ALLERGIC RHINITIS 12/04/2009   Qualifier: Diagnosis of  By: Jorene Minors, Scott    . Allergy   . CHOLELITHIASIS 05/30/2009   Qualifier: Diagnosis of  By: Jorene Minors, Scott    . COLONIC POLYPS, HX OF 05/09/2010   Qualifier: Diagnosis of  By: Jorene Minors, Scott    . Essential hypertension, benign 05/05/2009   Qualifier: Diagnosis of  By: Jorene Minors, Scott    . GERD 06/12/2009   Qualifier: Diagnosis of  By: Jorene Minors, Scott    .  Hypertension   . KNEE [name] 06/12/2009   Qualifier: Diagnosis of  By: Jorene Minors, Scott    . LIVER MASS 05/30/2009   Qualifier: Diagnosis of  By: Jorene Minors, Scott    . OBESITY 10/24/2010   Qualifier: Diagnosis of  By: Keenan Bachelor RN, Rip Harbour    . TINNITUS, CHRONIC, RIGHT 09/07/2010   Qualifier: Diagnosis of  By: Amil Amen MD, Benjamine Mola    . TOOTH LOSS 09/07/2010   Qualifier: Diagnosis of  By: Amil Amen MD, Benjamine Mola    . 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 VARICOSE VEINS, LOWER EXTREMITIES 05/05/2009   Qualifier: Diagnosis of  By: Versie Starks      Past Surgical History:  Procedure Laterality Date  . ABDOMINAL HYSTERECTOMY    . CESAREAN SECTION      Current Medications: No outpatient medications have been marked as taking for the 03/11/19 encounter (Office Visit) with Sueanne Margarita, MD.    Allergies:   Patient has no known allergies.   Social History   Socioeconomic History  . Marital status: Married    Spouse name: Freida Busman  . Number of children: 5  . Years of education: 6th grade  . Highest education level: Not on  file  Occupational History  . Occupation: homemaker  Social Needs  . Financial resource strain: Not on file  . Food insecurity    Worry: Not on file    Inability: Not on file  . Transportation needs    Medical: Not on file    Non-medical: Not on file  Tobacco Use  . Smoking status: Never Smoker  . Smokeless tobacco: Never Used  Substance and Sexual Activity  . Alcohol use: No    Alcohol/week: 0.0 standard drinks  . Drug use: No  . Sexual activity: Not on file  Lifestyle  . Physical activity    Days per week: Not on file    Minutes per session: Not on file  . Stress: Not on file  Relationships  . Social Herbalist on phone: Not on file    Gets together: Not on file    Attends religious service: Not on file    Active member of club or organization: Not on file    Attends meetings of  clubs or organizations: Not on file    Relationship status: Not on file  Other Topics Concern  . Not on file  Social History Narrative   From [place], [place]. Came to the [place] 2002. Lives with her husband. Their son and grandson live nearby.     Family History:  The patient's family history includes Cancer in her sister; Diabetes in her paternal grandmother; Hyperlipidemia in her brother, brother, brother, brother, brother, sister, sister, and sister; Hypertension in her brother, brother, brother, brother, brother, paternal grandmother, sister, sister, and sister; Migraines in her daughter.   ROS:   Please see the history of present illness.    ROS All other systems reviewed and are negative.  No flowsheet data found.  PHYSICAL EXAM:   VS:  BP (!) 208/96   Pulse 68   Ht 5\' 2"  (1.575 m)   Wt 247 lb 9.6 oz (112.3 kg)   SpO2 98%   BMI 45.29 kg/m    GEN: Well nourished, well developed, in no acute distress  HEENT: normal  Neck: no JVD, carotid bruits, or masses Cardiac: RRR; no murmurs, rubs, or gallops,no edema.  Intact distal pulses bilaterally.  Respiratory:  clear to auscultation bilaterally, normal work of breathing GI: soft, nontender, nondistended, + BS MS: no deformity or atrophy  Skin: warm and dry, no rash Neuro:  Alert and Oriented x 3, Strength and sensation are intact Psych: euthymic mood, full affect  Wt Readings from Last 3 Encounters:  03/11/19 247 lb 9.6 oz (112.3 kg)  10/19/14 249 lb (112.9 kg)  01/15/13 249 lb (112.9 kg)      Studies/Labs Reviewed:   EKG:  EKG is not ordered today.    Recent Labs: No results found for requested labs within last 8760 hours.   Lipid Panel    Component Value Date/Time   CHOL 169 01/15/2013 1017   TRIG 176 (H) 01/15/2013 1017   HDL 42 01/15/2013 1017   CHOLHDL 4.0 01/15/2013 1017   VLDL 35 01/15/2013 1017   LDLCALC 92 01/15/2013 1017    Additional studies/ records that were reviewed today include:  Office notes  from urgent care    ASSESSMENT:    1. Pre-syncope   2. Family history of early CAD   3. Dizziness   4. Poorly-controlled hypertension   5. Morbid obesity ([place])      PLAN:  In order of problems listed above:  1. Dizziness -etiology unclear -  may be due to poorly controlled HTN but also concerned that this could be an arrhythmia -most episodes occur standing up but she had an episode yesterday sitting -she does have CRFs including obesity, fm hx of CAD and poorly controlled HTN -I will get a 30 day event monitor to rule out arrhythmia -check 2D echo to assess for structural heart disease - I do not hear a murmur on exam -Lexiscan myoview to rule out ischemia  2.  Poorly controlled hypertension -she had been on BP meds in the past but stopped taking them 6-7 years ago -I have instructed her to stop Toprol and start Carvedilol 6.25mg  BID and chlorthalidone 25mg  daily -followup in HTN clinic in 1 week for uptitration of meds -add ARB at next OV if renal function normal on labs today -check for secondary causes of HTN  -check TSH, PRA and BMET -renal dopplers to rule out renal artery stenosis -home sleep study  3.  Morbid Obesity  -I have encouraged he to get into a routine exercise program and cut back on carbs and portions.   Medication Adjustments/Labs and Tests Ordered: Current medicines are reviewed at length with the patient today.  Concerns regarding medicines are outlined above.  Medication changes, Labs and Tests ordered today are listed in the Patient Instructions below.  There are no Patient Instructions on file for this visit.   Signed, Fransico Him, MD  03/11/2019 2:41 PM    [address], [place],   [address] Phone: [phone]; Fax: [phone]

## 2019-03-11 NOTE — Telephone Encounter (Signed)
Enrolled patient for a 30 Day [employer] Event monitor. Info was verified at check out. Monitor was mailed  to [address], [place] [phone]. Pt is staying with daughter at this time. Patient knows to expect the monitor to arrive in 3-4 days.

## 2019-03-11 NOTE — Telephone Encounter (Signed)
-----   Message from Anita Primrose, RN sent at 03/11/2019  2:58 PM EDT ----- Regarding: Home Sleep Hello  This pt will need to be set up for a home sleep study. She is spanish speaking only.   Thanks!  Lorren

## 2019-03-12 LAB — BASIC METABOLIC PANEL
BUN/Creatinine Ratio: 18 (ref 12–28)
BUN: 12 mg/dL (ref 8–27)
CO2: 22 mmol/L (ref 20–29)
Calcium: 9.4 mg/dL (ref 8.7–10.3)
Chloride: 103 mmol/L (ref 96–106)
Creatinine, Ser: 0.66 mg/dL (ref 0.57–1.00)
GFR calc Af Amer: 107 mL/min/{1.73_m2} (ref >59)
GFR calc non Af Amer: 93 mL/min/{1.73_m2} (ref >59)
Glucose: 95 mg/dL (ref 65–99)
Potassium: 4.3 mmol/L (ref 3.5–5.2)
Sodium: 142 mmol/L (ref 134–144)

## 2019-03-15 ENCOUNTER — Telehealth: Payer: Self-pay | Admitting: *Deleted

## 2019-03-15 NOTE — Telephone Encounter (Signed)
Staff message sent to Gae Bon according to chart patient has no insurance, no PA to do. Ok to schedule sleep study.

## 2019-03-15 NOTE — Telephone Encounter (Signed)
-----   Message from Anita Primrose, RN sent at 03/11/2019  2:58 PM EDT ----- Regarding: Home Sleep Hello  This pt will need to be set up for a home sleep study. She is spanish speaking only.   Thanks!  Lorren

## 2019-03-16 ENCOUNTER — Emergency Department (HOSPITAL_COMMUNITY)
Admission: EM | Admit: 2019-03-16 | Discharge: 2019-03-16 | Disposition: A | Discharge status: Home / Self Care | Payer: Self-pay | Attending: Emergency Medicine | Admitting: Emergency Medicine

## 2019-03-16 ENCOUNTER — Encounter (HOSPITAL_COMMUNITY): Payer: Self-pay | Admitting: Emergency Medicine

## 2019-03-16 ENCOUNTER — Other Ambulatory Visit: Payer: Self-pay

## 2019-03-16 ENCOUNTER — Emergency Department (HOSPITAL_COMMUNITY): Payer: Self-pay

## 2019-03-16 DIAGNOSIS — R42 Dizziness and giddiness: Secondary | ICD-10-CM | POA: Insufficient documentation

## 2019-03-16 DIAGNOSIS — I10 Essential (primary) hypertension: Secondary | ICD-10-CM | POA: Insufficient documentation

## 2019-03-16 DIAGNOSIS — R0602 Shortness of breath: Secondary | ICD-10-CM | POA: Insufficient documentation

## 2019-03-16 DIAGNOSIS — R0789 Other chest pain: Secondary | ICD-10-CM | POA: Insufficient documentation

## 2019-03-16 DIAGNOSIS — Z79899 Other long term (current) drug therapy: Secondary | ICD-10-CM | POA: Insufficient documentation

## 2019-03-16 LAB — CBC
HCT: 43.2 % (ref 36.0–46.0)
Hemoglobin: 14.5 g/dL (ref 12.0–15.0)
MCH: 30.5 pg (ref 26.0–34.0)
MCHC: 33.6 g/dL (ref 30.0–36.0)
MCV: 90.9 fL (ref 80.0–100.0)
Platelets: 196 10*3/uL (ref 150–400)
RBC: 4.75 MIL/uL (ref 3.87–5.11)
RDW: 12.6 % (ref 11.5–15.5)
WBC: 5.7 10*3/uL (ref 4.0–10.5)
nRBC: 0 % (ref 0.0–0.2)

## 2019-03-16 LAB — BASIC METABOLIC PANEL
Anion gap: 11 (ref 5–15)
BUN: 20 mg/dL (ref 8–23)
CO2: 27 mmol/L (ref 22–32)
Calcium: 9.1 mg/dL (ref 8.9–10.3)
Chloride: 100 mmol/L (ref 98–111)
Creatinine, Ser: 0.83 mg/dL (ref 0.44–1.00)
GFR calc Af Amer: >60 mL/min (ref >60)
GFR calc non Af Amer: >60 mL/min (ref >60)
Glucose, Bld: 106 mg/dL — ABNORMAL HIGH (ref 70–99)
Potassium: 3.8 mmol/L (ref 3.5–5.1)
Sodium: 138 mmol/L (ref 135–145)

## 2019-03-16 LAB — TROPONIN I (HIGH SENSITIVITY)
Troponin I (High Sensitivity): 5 ng/L (ref <18)
Troponin I (High Sensitivity): 5 ng/L (ref <18)

## 2019-03-16 LAB — D-DIMER, QUANTITATIVE: D-Dimer, Quant: 2.81 ug/mL-FEU — ABNORMAL HIGH (ref 0.00–0.50)

## 2019-03-16 MED ORDER — SODIUM CHLORIDE 0.9% FLUSH: 3.0000 mL | Freq: Once | INTRAVENOUS | Status: DC

## 2019-03-16 MED ORDER — IOHEXOL 350 MG/ML SOLN
100.0000 mL | Freq: Once | INTRAVENOUS | Status: AC | PRN
  Administered 2019-03-16: 100 mL via INTRAVENOUS

## 2019-03-16 NOTE — ED Triage Notes (Signed)
Pt arrives to ED with c/o of Chest pain that started when she woke up describes it as slight and only lasted about 1 hour- pt denies any sob. Pts daughter states they were seen 1 week ago at [facility] due to near syncope and had follow up with cardiology. CP was a new symptom today however currently is pain free.

## 2019-03-16 NOTE — ED Triage Notes (Signed)
Attempted IV start ,unable to obtain at this time.

## 2019-03-16 NOTE — ED Notes (Signed)
Called lab they have blue top will run test

## 2019-03-16 NOTE — Discharge Instructions (Addendum)
Please follow-up with Dr. Radford Pax and continue with the outpatient test she has planned for you, continue taking all of your blood pressure medications.  Return if you have persistent chest pain, shortness of breath, if you pass out, or any other new or concerning symptoms occur.

## 2019-03-16 NOTE — ED Provider Notes (Signed)
[facility]PART[facility] Provider Note   CSN: [id number] Arrival date & time: 03/16/19  1009     History   Chief Complaint Chief Complaint  Patient presents with  . Chest [name]    HPI Anita [name] is a 63 y.o. female.     Anita [name] is a 63 y.o. female with a history of hypertension, hyperlipidemia and presyncope, who presents for evaluation of chest [name].  On 7/29 patient was evaluated for intermittent presyncopal episodes that she has been having over the past few weeks, she was referred to cardiology and was seen by Dr. Radford Pax recently regarding these episodes, unclear etiology, could be related to poorly controlled blood pressure and patient was started on medications but is also been scheduled for a 30-day event monitor, echo and Lexiscan cardiac perfusion study.  Patient reports this morning she woke up and had 1 of her presyncopal episode she reports that she feels a warm and tingly sensation in both her legs up to her chest and then she feels very lightheaded like she may pass out, this sometimes happens while she is seated or standing.  She has not had any syncopal episodes.  She reports that today she had 2 additional similar lightheaded episodes but with the last one had an episode of central chest [name] that lasted a few seconds and then went away. She reports associated shortness of breath, [name] not worse with exertion and no recurrence of symptoms. No lower extremity swelling.  Patient is Spanish-speaking, offered translator but patient prefers to use her daughter who is present with her at bedside.     Past Medical History:  Diagnosis Date  . ABDOMINAL [name] 05/05/2009   Qualifier: Diagnosis of  By: Jorene Minors, Scott    . Adjustment disorder with depressed mood 08/30/2009   Qualifier: Diagnosis of  By: Jorene Minors, Scott    . ALLERGIC RHINITIS 12/04/2009   Qualifier: Diagnosis of  By: Jorene Minors, Scott    . Allergy   . CHOLELITHIASIS  05/30/2009   Qualifier: Diagnosis of  By: Jorene Minors, Scott    . COLONIC POLYPS, HX OF 05/09/2010   Qualifier: Diagnosis of  By: Jorene Minors, Scott    . Essential hypertension, benign 05/05/2009   Qualifier: Diagnosis of  By: Jorene Minors, Scott    . GERD 06/12/2009   Qualifier: Diagnosis of  By: Jorene Minors, Scott    . Hypertension   . KNEE [name] 06/12/2009   Qualifier: Diagnosis of  By: Jorene Minors, Scott    . LIVER MASS 05/30/2009   Qualifier: Diagnosis of  By: Jorene Minors, Scott    . OBESITY 10/24/2010   Qualifier: Diagnosis of  By: Keenan Bachelor RN, Rip Harbour    . TINNITUS, CHRONIC, RIGHT 09/07/2010   Qualifier: Diagnosis of  By: Amil Amen MD, Benjamine Mola    . TOOTH LOSS 09/07/2010   Qualifier: Diagnosis of  By: Amil Amen MD, Benjamine Mola    . 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 VARICOSE VEINS, LOWER EXTREMITIES 05/05/2009   Qualifier: Diagnosis of  By: Jorene Minors, Scott      Patient Active Problem List   Diagnosis Date Noted  . BMI 45.0-49.9, adult ([place]) 10/19/2014  . Venous stasis dermatitis of left lower extremity 10/19/2014  . OBESITY 10/24/2010  . TINNITUS, CHRONIC, RIGHT 09/07/2010  . TOOTH LOSS 09/07/2010  . COLONIC POLYPS, HX OF 05/09/2010  . ALLERGIC RHINITIS 12/04/2009  . 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  ADJUSTMENT DISORDER WITH DEPRESSED MOOD 08/30/2009  . GERD 06/12/2009  . KNEE [name] 06/12/2009  . LIVER MASS 05/30/2009  . CHOLELITHIASIS 05/30/2009  . ESSENTIAL HYPERTENSION, BENIGN 05/05/2009  . 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 ABDOMINAL [name] 05/05/2009    Past Surgical History:  Procedure Laterality Date  . ABDOMINAL HYSTERECTOMY    . CESAREAN SECTION       OB History   No obstetric history on file.      Home Medications    Prior to Admission medications   Medication Sig Start Date End Date Taking? Authorizing Provider  carvedilol (COREG) 6.25 MG tablet Take 1 tablet (6.25 mg total) by mouth 2  (two) times daily. 03/11/19  Yes Turner, Eber Hong, MD  chlorthalidone (HYGROTON) 25 MG tablet Take 1 tablet (25 mg total) by mouth daily. 03/11/19 06/09/19 Yes Turner, Eber Hong, MD  hydrochlorothiazide (HYDRODIURIL) 12.5 MG tablet Take 1 tablet (12.5 mg total) by mouth daily. Patient not taking: Reported on 03/16/2019 01/15/13   Murlean Iba, MD    Family History Family History  Problem Relation Age of Onset  . Hypertension Sister   . Hyperlipidemia Sister   . Hypertension Brother   . Hyperlipidemia Brother   . Migraines Daughter   . Hypertension Sister   . Hyperlipidemia Sister   . Cancer Sister   . Hypertension Sister   . Hyperlipidemia Sister   . Hypertension Brother   . Hyperlipidemia Brother   . Hypertension Brother   . Hyperlipidemia Brother   . Hypertension Brother   . Hyperlipidemia Brother   . Hypertension Brother   . Hyperlipidemia Brother   . Diabetes Paternal Grandmother   . Hypertension Paternal Grandmother     Social History Social History   Tobacco Use  . Smoking status: Never Smoker  . Smokeless tobacco: Never Used  Substance Use Topics  . Alcohol use: No    Alcohol/week: 0.0 standard drinks  . Drug use: No     Allergies   Patient has no known allergies.   Review of Systems Review of Systems  Constitutional: Negative for chills and fever.  HENT: Negative.   Eyes: Negative for visual disturbance.  Respiratory: Positive for shortness of breath. Negative for cough.   Cardiovascular: Positive for chest [name]. Negative for palpitations and leg swelling.  Gastrointestinal: Negative for abdominal [name], diarrhea, nausea and vomiting.  Genitourinary: Negative for dysuria and frequency.  Musculoskeletal: Negative for arthralgias and myalgias.  Skin: Negative for color change and rash.  Neurological: Positive for syncope and light-headedness. Negative for dizziness, weakness, numbness and headaches.     Physical Exam Updated Vital Signs BP (!)  170/66 (BP Location: Right Arm)   Pulse (!) 58   Temp 98.4 F (36.9 C) (Oral)   Resp 16   SpO2 95%   Physical Exam Vitals signs and nursing note reviewed.  Constitutional:      General: She is not in acute distress.    Appearance: She is well-developed. She is obese. She is not ill-appearing or diaphoretic.  HENT:     Head: Normocephalic and atraumatic.  Eyes:     General:        Right eye: No discharge.        Left eye: No discharge.     Pupils: Pupils are equal, round, and reactive to light.  Neck:     Musculoskeletal: Neck supple.     Vascular: No JVD.     Trachea: No tracheal deviation.  Cardiovascular:  Rate and Rhythm: Normal rate and regular rhythm.     Pulses:          Radial pulses are 2+ on the right side and 2+ on the left side.       Dorsalis pedis pulses are 2+ on the right side and 2+ on the left side.     Heart sounds: Normal heart sounds. No murmur. No friction rub. No gallop.   Pulmonary:     Effort: Pulmonary effort is normal. No respiratory distress.     Breath sounds: Normal breath sounds. No wheezing or rales.     Comments: Respirations equal and unlabored, patient able to speak in full sentences, lungs clear to auscultation bilaterally Chest:     Chest wall: No tenderness.  Abdominal:     General: Bowel sounds are normal. There is no distension.     Palpations: Abdomen is soft. There is no mass.     Tenderness: There is no abdominal tenderness. There is no guarding.     Comments: Abdomen soft, nondistended, nontender to palpation in all quadrants without guarding or peritoneal signs  Musculoskeletal:        General: No deformity.     Right lower leg: She exhibits no tenderness. No edema.     Left lower leg: She exhibits no tenderness. No edema.     Comments: Bilateral lower extremities without edema or tenderness  Skin:    General: Skin is warm and dry.     Capillary Refill: Capillary refill takes less than 2 seconds.  Neurological:     Mental  Status: She is alert.     Coordination: Coordination normal.     Comments: Speech is clear, able to follow commands CN III-XII intact Normal strength in upper and lower extremities bilaterally including dorsiflexion and plantar flexion, strong and equal grip strength Sensation normal to light and sharp touch Moves extremities without ataxia, coordination intact   Psychiatric:        Mood and Affect: Mood normal.        Behavior: Behavior normal.      ED Treatments / Results  Labs (all labs ordered are listed, but only abnormal results are displayed) Labs Reviewed  BASIC METABOLIC PANEL - Abnormal; Notable for the following components:      Result Value   Glucose, Bld 106 (*)    All other components within normal limits  D-DIMER, QUANTITATIVE (NOT AT [facility]) - Abnormal; Notable for the following components:   D-Dimer, Quant 2.81 (*)    All other components within normal limits  CBC  TROPONIN I (HIGH SENSITIVITY)  TROPONIN I (HIGH SENSITIVITY)    EKG None  Radiology Dg Chest 2 View  Result Date: 03/16/2019 CLINICAL DATA:  Chest [name] and near syncope EXAM: CHEST - 2 VIEW COMPARISON:  July 14, 2009 FINDINGS: No edema or consolidation. Heart is mildly enlarged with pulmonary vascularity normal. No adenopathy. There is stable anterior wedging at T12. No new bone lesions. IMPRESSION: No edema or consolidation.  Heart mildly enlarged. Electronically Signed   By: Lowella Grip III M.D.   On: 03/16/2019 10:55    Procedures Procedures (including critical care time)  Medications Ordered in ED Medications  iohexol (OMNIPAQUE) 350 MG/ML injection 100 mL (100 mLs Intravenous Contrast Given 03/16/19 1501)     Initial Impression / Assessment and Plan / ED Course  I have reviewed the triage vital signs and the nursing notes.  Pertinent labs & imaging results that were available during  my care of the patient were reviewed by me and considered in my medical decision making (see  chart for details).  63 year old female presents with episode of chest [name] today, she has been seen recently by cardiology for multiple pre-syncopal episodes. Today she had multiple pre-syncopal episodes, no syncope, and then had a brief episode of shortness of chest [name] and shortness of breath, currently symptom free, hypertensive, but vitals otherwise normal. Heart RRR and lungs clear. EKG with NSR and no ischemic changes. Initial troponin negative. CXR clear. No leukocytosis, normal HGB, no acute electrolyte derangements, and normal renal function. Given near syncopal symptoms now with CP and shortness of breath, there is some concern for PE, although not high risk. D-dimer positive at 2.81.  CTA shows no evidence of PE or other active cardiopulmonary disease. Delta troponin with no interval increase, making ischemic heart disease very unlikely. Given reassuring work up feel pt is stable for discharge home and continued outpatient workup with cardiology. Strict return precautions discussed, again offered interpreter, but pt preferred to have her daughter interpret. They express understanding and agreement with plan.  Final Clinical Impressions(s) / ED Diagnoses   Final diagnoses:  Atypical chest [name]  Lightheadedness    ED Discharge Orders    None       Jacqlyn Larsen, [place] 03/18/19 1422    Lucrezia Starch, MD 03/18/19 1500

## 2019-03-18 ENCOUNTER — Ambulatory Visit (INDEPENDENT_AMBULATORY_CARE_PROVIDER_SITE_OTHER): Payer: Self-pay

## 2019-03-18 ENCOUNTER — Telehealth: Payer: Self-pay | Admitting: Cardiology

## 2019-03-18 ENCOUNTER — Telehealth: Payer: Self-pay | Admitting: *Deleted

## 2019-03-18 ENCOUNTER — Telehealth: Payer: Self-pay

## 2019-03-18 DIAGNOSIS — R55 Syncope and collapse: Secondary | ICD-10-CM

## 2019-03-18 DIAGNOSIS — R42 Dizziness and giddiness: Secondary | ICD-10-CM

## 2019-03-18 NOTE — Telephone Encounter (Signed)
-----   Message from Lauralee Evener, [place] sent at 03/15/2019 10:18 AM EDT ----- Regarding: RE: Home Sleep According to chart patient has no insurance. No PA to do. ----- Message ----- From: Dollene Primrose, RN Sent: 03/11/2019   2:58 PM EDT To: Freada Bergeron, CMA, Cv Div Sleep Studies Subject: Home Sleep                                     Hello  This pt will need to be set up for a home sleep study. She is spanish speaking only.   Thanks!  Lorren

## 2019-03-18 NOTE — Telephone Encounter (Signed)
Called pt to inform her of normal lab results per Dr. Radford Pax. Pt verbalized understanding.

## 2019-03-18 NOTE — Telephone Encounter (Signed)
Per dpr spoke to Anita [name] (daughter) as patient is Spanish speaking only. Patient/Daughter is aware and agreeable to Home Sleep Study through [facility]. Patient is scheduled for 10/14 at 1:30 to pick up home sleep kit

## 2019-03-18 NOTE — Telephone Encounter (Signed)
New message   Patient's daughter is calling about sleep device. Please call.

## 2019-03-18 NOTE — Telephone Encounter (Signed)
Per dpr spoke to [place] (daughter) as patient is Spanish speaking only. Patient/Daughter is aware and agreeable to Home Sleep Study through [facility]. Patient is scheduled for 10/14 at 1:30 to pick up home sleep kit and meet with Respiratory therapist at [facility]. Patient is aware that if this appointment date and time does not work for them they should contact Artis Delay directly at [phone]. Patient/daughter is aware that a sleep packet will be sent from [facility] in week. Patient/daughter is agreeable to treatment and thankful for call

## 2019-03-22 ENCOUNTER — Telehealth (HOSPITAL_COMMUNITY): Payer: Self-pay | Admitting: *Deleted

## 2019-03-22 ENCOUNTER — Other Ambulatory Visit: Payer: Self-pay | Admitting: *Deleted

## 2019-03-22 ENCOUNTER — Other Ambulatory Visit: Payer: Self-pay

## 2019-03-22 ENCOUNTER — Encounter: Payer: Self-pay | Admitting: Pharmacist

## 2019-03-22 ENCOUNTER — Ambulatory Visit (INDEPENDENT_AMBULATORY_CARE_PROVIDER_SITE_OTHER): Payer: Self-pay

## 2019-03-22 VITALS — BP 152/88 | HR 53

## 2019-03-22 DIAGNOSIS — R42 Dizziness and giddiness: Secondary | ICD-10-CM

## 2019-03-22 DIAGNOSIS — R55 Syncope and collapse: Secondary | ICD-10-CM

## 2019-03-22 DIAGNOSIS — Z8249 Family history of ischemic heart disease and other diseases of the circulatory system: Secondary | ICD-10-CM

## 2019-03-22 DIAGNOSIS — I10 Essential (primary) hypertension: Secondary | ICD-10-CM

## 2019-03-22 MED ORDER — LISINOPRIL 10 MG PO TABS: 10.0000 mg | ORAL_TABLET | Freq: Every day | ORAL | 11 refills | Status: DC

## 2019-03-22 NOTE — Patient Instructions (Addendum)
  Gracias por recibirnos hoy!   Empiece a tomar lisinopril 10 mg 1 comprimido por va oral al da. Hemos enviado esto a su farmacia para que lo recoja.   Anlisis de sangre hoy: llamaremos si hay algn problema.   Intente caminar 3 veces por semana durante 15 minutos cada una.  Minimice la cantidad de sal que est agregando a su comida. Pruebe otras alternativas de condimentos para darle sabor a la comida, ya que esto tambin puede empeorar la hinchazn de las piernas.  Llame a la prctica de Scientist, physiological [place] para establecer un mdico de atencin primaria. [phone]   Su prxima cita con nosotros es el lunes 24 de agosto a la 1:30 pm.

## 2019-03-22 NOTE — Telephone Encounter (Signed)
Patient's daughter, per dpr, given detailed instructions per Myocardial Perfusion Study Information Sheet for the test on 03/24/19. Patient notified to arrive 15 minutes early and that it is imperative to arrive on time for appointment to keep from having the test rescheduled.  If you need to cancel or reschedule your appointment, please call the office within 24 hours of your appointment. . Patient verbalized understanding. Kirstie Peri

## 2019-03-22 NOTE — Progress Notes (Signed)
Patient ID: Anita [name]                 DOB: [birth year]                      MRN: [id number]     HPI: Anita [name] is a 63 y.o. female referred by Dr. Radford Pax to HTN clinic. PMH is significant for HTN. Patient was last seen by Dr. Radford Pax on 7/30 for pre-syncope and uncontrolled HTN. Patient had been taking BP medications in the past but stopped taking 6-7 years ago.  BP was 208/96 and pt was started on carvedilol 6.25 mg BID and chlorthalidone 25 mg daily at this visit. Dr. Radford Pax also discontinued metoprolol succinate and hydrochlorothiazide from medication profile. She was given a cardiac monitor to wear for 1 month to rule out arrhythmia induced pre-syncope. This was placed 4 days prior per patient. Since this last visit with Dr. Radford Pax, patient has had one ED visit for chest pain following a pre-syncopal episode on 8/4. Ruled out ACS and PE during ED visit.  Patient presents to clinic today for HTN follow up. She is accompanied by her daughter and Spanish-speaking interpreter. She reports dizziness has not worsened or improved since starting her blood pressure medications. She does not check her blood pressure at home. BP in clinic today was above goal at 142/78 (goal < 130/80). Blood pressure taken again upon standing was 152/88. She took her carvedilol and chlorthalidone this morning around 9AM.  Patient reports headaches that have worsened since seeing Dr. Radford Pax and complains of lower extremity swelling, erythema, and pain. Most of the lower extremity symptoms are above the knee. She denies shortness of breath.  Current HTN meds: carvedilol 6.25 mg BID, chlorthalidone 25 mg daily  Previously tried: metoprolol succinate and hydrochlorothiazide (stopped taking ~6 years ago, discontinued by MD)  BP goal: <130/80  Family History: Father - CAD, CABG; Mother - DCM  Social History: never smoker, no alcohol use  Diet: Breakfast - oatmeal Lunch - "whatever is around" Protein - fish,  chicken, red meat Vegetables - few Fruit - few Drinks: minimal intake - occasional regular soda (not diet), does not drink coffee, mostly water though she states she does not like it plain, some juices   Exercise: None  Wt Readings from Last 3 Encounters:  03/11/19 247 lb 9.6 oz (112.3 kg)  10/19/14 249 lb (112.9 kg)  01/15/13 249 lb (112.9 kg)   BP Readings from Last 3 Encounters:  03/22/19 (!) 152/88  03/16/19 (!) 170/66  03/11/19 (!) 208/96   Pulse Readings from Last 3 Encounters:  03/22/19 (!) 53  03/16/19 (!) 58  03/11/19 68    Renal function: Estimated Creatinine Clearance: 80 mL/min (by C-G formula based on SCr of 0.83 mg/dL).  Past Medical History:  Diagnosis Date  . ABDOMINAL PAIN 05/05/2009   Qualifier: Diagnosis of  By: Jorene Minors, Scott    . Adjustment disorder with depressed mood 08/30/2009   Qualifier: Diagnosis of  By: Jorene Minors, Scott    . ALLERGIC RHINITIS 12/04/2009   Qualifier: Diagnosis of  By: Jorene Minors, Scott    . Allergy   . CHOLELITHIASIS 05/30/2009   Qualifier: Diagnosis of  By: Jorene Minors, Scott    . COLONIC POLYPS, HX OF 05/09/2010   Qualifier: Diagnosis of  By: Jorene Minors, Scott    . Essential hypertension, benign 05/05/2009   Qualifier: Diagnosis of  By: Jorene Minors, Scott    . GERD  06/12/2009   Qualifier: Diagnosis of  By: Jorene Minors, Scott    . Hypertension   . KNEE PAIN 06/12/2009   Qualifier: Diagnosis of  By: Jorene Minors, Scott    . LIVER MASS 05/30/2009   Qualifier: Diagnosis of  By: Jorene Minors, Scott    . OBESITY 10/24/2010   Qualifier: Diagnosis of  By: Keenan Bachelor RN, Rip Harbour    . TINNITUS, CHRONIC, RIGHT 09/07/2010   Qualifier: Diagnosis of  By: Amil Amen MD, Benjamine Mola    . TOOTH LOSS 09/07/2010   Qualifier: Diagnosis of  By: Amil Amen MD, Benjamine Mola    . 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 VARICOSE VEINS, LOWER EXTREMITIES 05/05/2009   Qualifier: Diagnosis of  By: Versie Starks      Current Outpatient Medications on File Prior to Visit  Medication Sig Dispense Refill  . carvedilol (COREG) 6.25 MG tablet Take 1 tablet (6.25 mg total) by mouth 2 (two) times daily. 180 tablet 3  . chlorthalidone (HYGROTON) 25 MG tablet Take 1 tablet (25 mg total) by mouth daily. 90 tablet 3  . hydrochlorothiazide (HYDRODIURIL) 12.5 MG tablet Take 1 tablet (12.5 mg total) by mouth daily. (Patient not taking: Reported on 03/16/2019) 30 tablet 3   No current facility-administered medications on file prior to visit.     No Known Allergies   Assessment/Plan:  1. Hypertension - Blood pressure today was above goal at 142/78 (goal < 130/80). Much improved since last visits to Dr. Radford Pax and the ED. Negative orthostatics. Start taking lisinopril 10 mg once daily. Will continue carvedilol 6.25mg  BID and chlorthalidone 25mg  daily. Labs collected today - will call if there are any changes to the current plan pending results.  Advised patient to flavor water with sugar-free additives that are available in most grocery stores. Minimize salt intake and adding salt to foods as this can raise blood pressure and worsen edema. Instructed to elevate legs. Increase exercise by walking for 15 minutes 3 times weekly.   2. No PCP - Patient interested in establishing PCP within the [place]. Provided the phone number to the [facility].   Follow up in HTN clinic in 2 weeks.  Vertis Kelch, PharmD PGY2 Cardiology Pharmacy Resident 03/22/2019       4:34 PM

## 2019-03-23 ENCOUNTER — Telehealth: Payer: Self-pay | Admitting: Cardiology

## 2019-03-23 LAB — BASIC METABOLIC PANEL
BUN/Creatinine Ratio: 25 (ref 12–28)
BUN: 18 mg/dL (ref 8–27)
CO2: 29 mmol/L (ref 20–29)
Calcium: 9.5 mg/dL (ref 8.7–10.3)
Chloride: 96 mmol/L (ref 96–106)
Creatinine, Ser: 0.72 mg/dL (ref 0.57–1.00)
GFR calc Af Amer: 102 mL/min/{1.73_m2} (ref >59)
GFR calc non Af Amer: 88 mL/min/{1.73_m2} (ref >59)
Glucose: 118 mg/dL — ABNORMAL HIGH (ref 65–99)
Potassium: 3.3 mmol/L — ABNORMAL LOW (ref 3.5–5.2)
Sodium: 140 mmol/L (ref 134–144)

## 2019-03-23 MED ORDER — VALSARTAN 80 MG PO TABS: 80.0000 mg | ORAL_TABLET | Freq: Every day | ORAL | 11 refills | Status: DC

## 2019-03-23 NOTE — Telephone Encounter (Signed)
Pt c/o medication issue:  1. Name of Medication:   2. How are you currently taking this medication (dosage and times per day)? lisinopril (ZESTRIL) 10 MG tablet  3. Are you having a reaction (difficulty breathing--STAT)? No   4. What is your medication issue? Tingling in her arms, back is burning up, and having chills.

## 2019-03-23 NOTE — Telephone Encounter (Addendum)
Lisinopril does not typically cause these symptoms. She can try switching to valsartan 80mg  daily instead if she prefers. Recommend keeping follow up appt in HTN clinic. Also recommend holding off on starting K supplementation since pt will be on ACE or ARB therapy - can recheck BMET at follow up appt. Hopefully with BP med titration, K will improve as well.

## 2019-03-23 NOTE — Telephone Encounter (Signed)
Called patient with Spanish interpreter [phone] and reviewed instructions from [employer], [facility].  Patient states the symptoms that she had earlier today have returned and she would like to d/c lisinopril and start valsartan 80 mg.I emphasized through the interpreter for her not to take these medications together I reviewed lab results with her and advised that we will recheck lab at her appointment on 8/24 with PharmD She verbalized understanding and agreement with plan and thanked me for the call.

## 2019-03-23 NOTE — Telephone Encounter (Signed)
Called patient with the assistance of Spanish interpreter [phone] Patient was seen in our Hypertension clinic yesterday and prescribed lisinopril 10 mg  States today she took her first dose of Lisinopril 10 mg and afterwards felt bilateral arm pain from elbows to shoulders with tingling and feeling hot; also felt chills Denies difficulty breathing, lip or tongue swelling; felt a "little bit" of facial flushing States she has felt the arm pain in the past intermittently No problems presently, states these symptoms have resolved I asked if patient has BP cuff at home and she denies I advised her to hold the lisinopril until she hears back from [place] and that I will forward call to PharmD for advice Patient verbalized understanding and agreement with plan of care

## 2019-03-24 ENCOUNTER — Ambulatory Visit (HOSPITAL_COMMUNITY): Payer: Self-pay | Attending: Cardiology

## 2019-03-24 ENCOUNTER — Ambulatory Visit (HOSPITAL_BASED_OUTPATIENT_CLINIC_OR_DEPARTMENT_OTHER): Payer: Self-pay

## 2019-03-24 ENCOUNTER — Other Ambulatory Visit: Payer: Self-pay

## 2019-03-24 DIAGNOSIS — R55 Syncope and collapse: Secondary | ICD-10-CM

## 2019-03-24 DIAGNOSIS — Z8249 Family history of ischemic heart disease and other diseases of the circulatory system: Secondary | ICD-10-CM | POA: Insufficient documentation

## 2019-03-24 DIAGNOSIS — R42 Dizziness and giddiness: Secondary | ICD-10-CM | POA: Insufficient documentation

## 2019-03-24 LAB — MYOCARDIAL PERFUSION IMAGING
LV dias vol: 62 mL (ref 46–106)
LV sys vol: 21 mL
Peak HR: 81 {beats}/min
Rest HR: 58 {beats}/min
SDS: 1
SRS: 0
SSS: 1
TID: 1.11

## 2019-03-24 LAB — ECHOCARDIOGRAM COMPLETE
Height: 62 in
Weight: 3952 oz

## 2019-03-24 MED ORDER — TECHNETIUM TC 99M TETROFOSMIN IV KIT
32.9000 | PACK | Freq: Once | INTRAVENOUS | Status: AC | PRN
  Administered 2019-03-24: 32.9 via INTRAVENOUS
  Filled 2019-03-24: qty 33

## 2019-03-24 MED ORDER — REGADENOSON 0.4 MG/5ML IV SOLN
0.4000 mg | Freq: Once | INTRAVENOUS | Status: AC
  Administered 2019-03-24: 0.4 mg via INTRAVENOUS

## 2019-03-24 MED ORDER — TECHNETIUM TC 99M TETROFOSMIN IV KIT
10.7000 | PACK | Freq: Once | INTRAVENOUS | Status: AC | PRN
  Administered 2019-03-24: 10.7 via INTRAVENOUS
  Filled 2019-03-24: qty 11

## 2019-03-25 ENCOUNTER — Encounter: Payer: Self-pay | Admitting: Cardiology

## 2019-03-25 DIAGNOSIS — I350 Nonrheumatic aortic (valve) stenosis: Secondary | ICD-10-CM | POA: Insufficient documentation

## 2019-03-26 ENCOUNTER — Telehealth: Payer: Self-pay | Admitting: Cardiology

## 2019-03-26 ENCOUNTER — Other Ambulatory Visit: Payer: Self-pay

## 2019-03-26 ENCOUNTER — Encounter (HOSPITAL_COMMUNITY): Payer: Self-pay | Admitting: *Deleted

## 2019-03-26 ENCOUNTER — Emergency Department (HOSPITAL_COMMUNITY): Payer: Self-pay

## 2019-03-26 ENCOUNTER — Inpatient Hospital Stay (HOSPITAL_COMMUNITY)
Admission: EM | Admit: 2019-03-26 | Discharge: 2019-03-30 | DRG: 243 | Disposition: A | Discharge status: Home / Self Care | Payer: Self-pay | Attending: Internal Medicine | Admitting: Internal Medicine

## 2019-03-26 DIAGNOSIS — Z20828 Contact with and (suspected) exposure to other viral communicable diseases: Secondary | ICD-10-CM | POA: Diagnosis present

## 2019-03-26 DIAGNOSIS — Z8349 Family history of other endocrine, nutritional and metabolic diseases: Secondary | ICD-10-CM

## 2019-03-26 DIAGNOSIS — Z833 Family history of diabetes mellitus: Secondary | ICD-10-CM

## 2019-03-26 DIAGNOSIS — N179 Acute kidney failure, unspecified: Secondary | ICD-10-CM | POA: Diagnosis present

## 2019-03-26 DIAGNOSIS — I4510 Unspecified right bundle-branch block: Secondary | ICD-10-CM | POA: Diagnosis present

## 2019-03-26 DIAGNOSIS — K219 Gastro-esophageal reflux disease without esophagitis: Secondary | ICD-10-CM | POA: Diagnosis present

## 2019-03-26 DIAGNOSIS — Z6841 Body Mass Index (BMI) 40.0 and over, adult: Secondary | ICD-10-CM

## 2019-03-26 DIAGNOSIS — Z86010 Personal history of colonic polyps: Secondary | ICD-10-CM

## 2019-03-26 DIAGNOSIS — F419 Anxiety disorder, unspecified: Secondary | ICD-10-CM | POA: Diagnosis present

## 2019-03-26 DIAGNOSIS — I452 Bifascicular block: Secondary | ICD-10-CM | POA: Diagnosis present

## 2019-03-26 DIAGNOSIS — R55 Syncope and collapse: Secondary | ICD-10-CM | POA: Diagnosis present

## 2019-03-26 DIAGNOSIS — I495 Sick sinus syndrome: Principal | ICD-10-CM | POA: Diagnosis present

## 2019-03-26 DIAGNOSIS — Z950 Presence of cardiac pacemaker: Secondary | ICD-10-CM

## 2019-03-26 DIAGNOSIS — I350 Nonrheumatic aortic (valve) stenosis: Secondary | ICD-10-CM | POA: Diagnosis present

## 2019-03-26 DIAGNOSIS — I10 Essential (primary) hypertension: Secondary | ICD-10-CM | POA: Diagnosis present

## 2019-03-26 DIAGNOSIS — E669 Obesity, unspecified: Secondary | ICD-10-CM | POA: Diagnosis present

## 2019-03-26 DIAGNOSIS — Z8249 Family history of ischemic heart disease and other diseases of the circulatory system: Secondary | ICD-10-CM

## 2019-03-26 DIAGNOSIS — W19XXXA Unspecified fall, initial encounter: Secondary | ICD-10-CM | POA: Diagnosis present

## 2019-03-26 DIAGNOSIS — Z90710 Acquired absence of both cervix and uterus: Secondary | ICD-10-CM

## 2019-03-26 LAB — COMPREHENSIVE METABOLIC PANEL
ALT: 22 U/L (ref 0–44)
AST: 23 U/L (ref 15–41)
Albumin: 4.1 g/dL (ref 3.5–5.0)
Alkaline Phosphatase: 74 U/L (ref 38–126)
Anion gap: 13 (ref 5–15)
BUN: 16 mg/dL (ref 8–23)
CO2: 27 mmol/L (ref 22–32)
Calcium: 9.5 mg/dL (ref 8.9–10.3)
Chloride: 98 mmol/L (ref 98–111)
Creatinine, Ser: 1.26 mg/dL — ABNORMAL HIGH (ref 0.44–1.00)
GFR calc Af Amer: 52 mL/min — ABNORMAL LOW (ref >60)
GFR calc non Af Amer: 45 mL/min — ABNORMAL LOW (ref >60)
Glucose, Bld: 118 mg/dL — ABNORMAL HIGH (ref 70–99)
Potassium: 3.2 mmol/L — ABNORMAL LOW (ref 3.5–5.1)
Sodium: 138 mmol/L (ref 135–145)
Total Bilirubin: 0.6 mg/dL (ref 0.3–1.2)
Total Protein: 7.7 g/dL (ref 6.5–8.1)

## 2019-03-26 LAB — DIFFERENTIAL
Abs Immature Granulocytes: 0.04 10*3/uL (ref 0.00–0.07)
Basophils Absolute: 0 10*3/uL (ref 0.0–0.1)
Basophils Relative: 0 %
Eosinophils Absolute: 0.2 10*3/uL (ref 0.0–0.5)
Eosinophils Relative: 2 %
Immature Granulocytes: 1 %
Lymphocytes Relative: 25 %
Lymphs Abs: 2 10*3/uL (ref 0.7–4.0)
Monocytes Absolute: 0.5 10*3/uL (ref 0.1–1.0)
Monocytes Relative: 6 %
Neutro Abs: 5.4 10*3/uL (ref 1.7–7.7)
Neutrophils Relative %: 66 %

## 2019-03-26 LAB — I-STAT CHEM 8, ED
BUN: 19 mg/dL (ref 8–23)
Calcium, Ion: 1.12 mmol/L — ABNORMAL LOW (ref 1.15–1.40)
Chloride: 98 mmol/L (ref 98–111)
Creatinine, Ser: 1.3 mg/dL — ABNORMAL HIGH (ref 0.44–1.00)
Glucose, Bld: 113 mg/dL — ABNORMAL HIGH (ref 70–99)
HCT: 42 % (ref 36.0–46.0)
Hemoglobin: 14.3 g/dL (ref 12.0–15.0)
Potassium: 3 mmol/L — ABNORMAL LOW (ref 3.5–5.1)
Sodium: 136 mmol/L (ref 135–145)
TCO2: 27 mmol/L (ref 22–32)

## 2019-03-26 LAB — CBC
HCT: 43.3 % (ref 36.0–46.0)
Hemoglobin: 14.7 g/dL (ref 12.0–15.0)
MCH: 30.1 pg (ref 26.0–34.0)
MCHC: 33.9 g/dL (ref 30.0–36.0)
MCV: 88.5 fL (ref 80.0–100.0)
Platelets: 234 10*3/uL (ref 150–400)
RBC: 4.89 MIL/uL (ref 3.87–5.11)
RDW: 12.4 % (ref 11.5–15.5)
WBC: 8.1 10*3/uL (ref 4.0–10.5)
nRBC: 0 % (ref 0.0–0.2)

## 2019-03-26 LAB — PROTIME-INR
INR: 1 (ref 0.8–1.2)
Prothrombin Time: 13.1 seconds (ref 11.4–15.2)

## 2019-03-26 LAB — APTT: aPTT: 28 seconds (ref 24–36)

## 2019-03-26 MED ORDER — SODIUM CHLORIDE 0.9% FLUSH
3.0000 mL | Freq: Once | INTRAVENOUS | Status: AC
Start: 2019-03-26 — End: 2019-03-27
  Administered 2019-03-27: 3 mL via INTRAVENOUS

## 2019-03-26 NOTE — Telephone Encounter (Signed)
Received a call from [employer] regarding critical ECG result.   Patient being monitored for episodes of dizziness.  Preventive representative noted that the patient had 2 pauses this evening at approximately 1835.  The patient's recording was automatically triggered and initiated with a 5.8-second pause.  She then transitioned into sinus rhythm at a rate of 83 bpm.  This progressed into second-degree type II heart block with a rate of 70 bpm followed by an 18-second pause.  Following the pause she again returned to second-degree type II heart block.    Notably, patient with episode of syncope in the shower which led her to present to the emergency department this evening, where she is currently being triaged.  Will follow-up, will require admission given significant pauses leading to syncope.  Bryna Colander, MD

## 2019-03-26 NOTE — ED Triage Notes (Addendum)
Pt fainted in the shower tonight, injuring her L foot. Had two other near syncopal episodes. Pt reported feeling warm from her feet up to her head prior to episodes. Per daughter, pt's speech sounded slurred, onset at 45; last time daughter spoke to her that the speech sounded normal was 1730. At present, per daughter, speech sounds normal. Equal grip strengths, no drift in arm or leg.   Pt also noted to have heart monitor in place for syncope

## 2019-03-27 ENCOUNTER — Other Ambulatory Visit: Payer: Self-pay

## 2019-03-27 ENCOUNTER — Emergency Department (HOSPITAL_COMMUNITY): Payer: Self-pay

## 2019-03-27 DIAGNOSIS — R55 Syncope and collapse: Secondary | ICD-10-CM | POA: Diagnosis present

## 2019-03-27 DIAGNOSIS — I459 Conduction disorder, unspecified: Secondary | ICD-10-CM

## 2019-03-27 LAB — BASIC METABOLIC PANEL
Anion gap: 11 (ref 5–15)
BUN: 16 mg/dL (ref 8–23)
CO2: 25 mmol/L (ref 22–32)
Calcium: 9.2 mg/dL (ref 8.9–10.3)
Chloride: 101 mmol/L (ref 98–111)
Creatinine, Ser: 0.75 mg/dL (ref 0.44–1.00)
GFR calc Af Amer: >60 mL/min (ref >60)
GFR calc non Af Amer: >60 mL/min (ref >60)
Glucose, Bld: 133 mg/dL — ABNORMAL HIGH (ref 70–99)
Potassium: 3.5 mmol/L (ref 3.5–5.1)
Sodium: 137 mmol/L (ref 135–145)

## 2019-03-27 LAB — CBG MONITORING, ED: Glucose-Capillary: 112 mg/dL — ABNORMAL HIGH (ref 70–99)

## 2019-03-27 LAB — SARS CORONAVIRUS 2 BY RT PCR (HOSPITAL ORDER, PERFORMED IN ~~LOC~~ HOSPITAL LAB): SARS Coronavirus 2: NEGATIVE

## 2019-03-27 MED ORDER — IRBESARTAN 150 MG PO TABS
75.0000 mg | ORAL_TABLET | Freq: Every day | ORAL | Status: DC
  Administered 2019-03-27 – 2019-03-29 (×3): 75 mg via ORAL
  Filled 2019-03-27 (×3): qty 1

## 2019-03-27 MED ORDER — HEPARIN SODIUM (PORCINE) 5000 UNIT/ML IJ SOLN
5000.0000 [IU] | Freq: Three times a day (TID) | INTRAMUSCULAR | Status: DC
  Administered 2019-03-27 – 2019-03-29 (×6): 5000 [IU] via SUBCUTANEOUS
  Filled 2019-03-27 (×6): qty 1

## 2019-03-27 MED ORDER — POTASSIUM CHLORIDE CRYS ER 20 MEQ PO TBCR
40.0000 meq | EXTENDED_RELEASE_TABLET | ORAL | Status: AC
  Administered 2019-03-27 (×2): 40 meq via ORAL
  Filled 2019-03-27 (×2): qty 2

## 2019-03-27 MED ORDER — GADOBUTROL 1 MMOL/ML IV SOLN
10.0000 mL | Freq: Once | INTRAVENOUS | Status: AC | PRN
  Administered 2019-03-27: 10 mL via INTRAVENOUS

## 2019-03-27 NOTE — ED Notes (Signed)
Patient transported to MRI 

## 2019-03-27 NOTE — Plan of Care (Signed)
Poc progressing.  

## 2019-03-27 NOTE — H&P (Signed)
Anita [name] is an 63 y.o. female.   Chief Complaint: syncope HPI: The patient is a pleasant 63 yo woman with a family h/o CHB. Her sister and father had PPM's. She was in her usual state of health until a couple of weeks ago when she began to experience sudden dizzy spells. She was seen in our office by Dr. Radford Pax. 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 She wore a cardiac monitor and was found to have multiple pauses, the longest was 18 seconds but also had some that were over 5 seconds. She was dizzy with the 5 second pauses and passed out with the 18 second pause. I do not have these for review.   Past Medical History:  Diagnosis Date   Anita [name] 05/05/2009   Qualifier: Diagnosis of  By: Jorene Minors, Scott     Adjustment disorder with depressed mood 08/30/2009   Qualifier: Diagnosis of  By: Jorene Minors, Scott     ALLERGIC RHINITIS 12/04/2009   Qualifier: Diagnosis of  By: Jorene Minors, Scott     Allergy    Aortic stenosis    mild by echo 03/2019   CHOLELITHIASIS 05/30/2009   Qualifier: Diagnosis of  By: Jorene Minors, Scott     COLONIC POLYPS, HX OF 05/09/2010   Qualifier: Diagnosis of  By: Jorene Minors, Scott     Essential hypertension, benign 05/05/2009   Qualifier: Diagnosis of  By: Jorene Minors, Scott     GERD 06/12/2009   Qualifier: Diagnosis of  By: Jorene Minors, Scott     Hypertension    KNEE [name] 06/12/2009   Qualifier: Diagnosis of  By: Versie Starks     LIVER MASS 05/30/2009   Qualifier: Diagnosis of  By: Jorene Minors, Scott     OBESITY 10/24/2010   Qualifier: Diagnosis of  By: Keenan Bachelor RN, [place], CHRONIC, RIGHT 09/07/2010   Qualifier: Diagnosis of  By: Amil Amen MD, [place] 09/07/2010   Qualifier: Diagnosis of  By: Amil Amen MD, [place] 08/30/2009   Qualifier: Diagnosis of  By: Jorene Minors, Scott     VARICOSE VEINS, LOWER  EXTREMITIES 05/05/2009   Qualifier: Diagnosis of  By: Versie Starks      Past Surgical History:  Procedure Laterality Date   Anita HYSTERECTOMY     CESAREAN SECTION      Family History  Problem Relation Age of Onset   Hypertension Sister    Hyperlipidemia Sister    Hypertension Brother    Hyperlipidemia Brother    Migraines Daughter    Hypertension Sister    Hyperlipidemia Sister    Cancer Sister    Hypertension Sister    Hyperlipidemia Sister    Hypertension Brother    Hyperlipidemia Brother    Hypertension Brother    Hyperlipidemia Brother    Hypertension Brother    Hyperlipidemia Brother    Hypertension Brother    Hyperlipidemia Brother    Diabetes Paternal Grandmother    Hypertension Paternal Grandmother    Social History:  reports that she has never smoked. She has never used smokeless tobacco. She reports that she does not drink alcohol or use drugs.  Allergies: No Known Allergies  (Not in a hospital admission)   Results for orders placed or performed during the hospital encounter of 03/26/19 (from the past 48 hour(s))  Protime-INR     Status:  None   Collection Time: 03/26/19  8:20 PM  Result Value Ref Range   Prothrombin Time 13.1 11.4 - 15.2 seconds   INR 1.0 0.8 - 1.2    Comment: (NOTE) INR goal varies based on device and disease states. Performed at [facility], [place] [address], [place], [place] [address]   APTT     Status: None   Collection Time: 03/26/19  8:20 PM  Result Value Ref Range   aPTT 28 24 - 36 seconds    Comment: Performed at [facility] [address] [place], [place] [address]  CBC     Status: None   Collection Time: 03/26/19  8:20 PM  Result Value Ref Range   WBC 8.1 4.0 - 10.5 K/uL   RBC 4.89 3.87 - 5.11 MIL/uL   Hemoglobin 14.7 12.0 - 15.0 g/dL   HCT 43.3 36.0 - 46.0 %   MCV 88.5 80.0 - 100.0 fL   MCH 30.1 26.0 - 34.0 pg   MCHC 33.9 30.0 - 36.0 g/dL   RDW 12.4 11.5 - 15.5 %    Platelets 234 150 - 400 K/uL   nRBC 0.0 0.0 - 0.2 %    Comment: Performed at [facility], [place] [address], [place], [place] [address]  Differential     Status: None   Collection Time: 03/26/19  8:20 PM  Result Value Ref Range   Neutrophils Relative % 66 %   Neutro Abs 5.4 1.7 - 7.7 K/uL   Lymphocytes Relative 25 %   Lymphs Abs 2.0 0.7 - 4.0 K/uL   Monocytes Relative 6 %   Monocytes Absolute 0.5 0.1 - 1.0 K/uL   Eosinophils Relative 2 %   Eosinophils Absolute 0.2 0.0 - 0.5 K/uL   Basophils Relative 0 %   Basophils Absolute 0.0 0.0 - 0.1 K/uL   Immature Granulocytes 1 %   Abs Immature Granulocytes 0.04 0.00 - 0.07 K/uL    Comment: Performed at [facility] [address] [place], [place] [address]  Comprehensive metabolic panel     Status: Abnormal   Collection Time: 03/26/19  8:20 PM  Result Value Ref Range   Sodium 138 135 - 145 mmol/L   Potassium 3.2 (L) 3.5 - 5.1 mmol/L   Chloride 98 98 - 111 mmol/L   CO2 27 22 - 32 mmol/L   Glucose, Bld 118 (H) 70 - 99 mg/dL   BUN 16 8 - 23 mg/dL   Creatinine, Ser 1.26 (H) 0.44 - 1.00 mg/dL   Calcium 9.5 8.9 - 10.3 mg/dL   Total Protein 7.7 6.5 - 8.1 g/dL   Albumin 4.1 3.5 - 5.0 g/dL   AST 23 15 - 41 U/L   ALT 22 0 - 44 U/L   Alkaline Phosphatase 74 38 - 126 U/L   Total Bilirubin 0.6 0.3 - 1.2 mg/dL   GFR calc non Af Amer 45 (L) >60 mL/min   GFR calc Af Amer 52 (L) >60 mL/min   Anion gap 13 5 - 15    Comment: Performed at [facility] [address], [place], [place] [address]  I-stat chem 8, ED     Status: Abnormal   Collection Time: 03/26/19  8:35 PM  Result Value Ref Range   Sodium 136 135 - 145 mmol/L   Potassium 3.0 (L) 3.5 - 5.1 mmol/L   Chloride 98 98 - 111 mmol/L   BUN 19 8 - 23 mg/dL  Creatinine, Ser 1.30 (H) 0.44 - 1.00 mg/dL   Glucose, Bld 113 (H) 70 - 99 mg/dL   Calcium, Ion 1.12 (L) 1.15 - 1.40 mmol/L   TCO2 27 22 - 32 mmol/L   Hemoglobin 14.3 12.0 - 15.0 g/dL   HCT 42.0 36.0 - 46.0 %  CBG  monitoring, ED     Status: Abnormal   Collection Time: 03/27/19  7:28 AM  Result Value Ref Range   Glucose-Capillary 112 (H) 70 - 99 mg/dL   Comment 1 Notify RN    Comment 2 Document in Chart   SARS Coronavirus 2 [facility] order, Performed in [facility] hospital lab) Nasopharyngeal Nasopharyngeal Swab     Status: None   Collection Time: 03/27/19  9:17 AM   Specimen: Nasopharyngeal Swab  Result Value Ref Range   SARS Coronavirus 2 NEGATIVE NEGATIVE    Comment: (NOTE) If result is NEGATIVE SARS-CoV-2 target nucleic acids are NOT DETECTED. The SARS-CoV-2 RNA is generally detectable in upper and lower  respiratory specimens during the acute phase of infection. The lowest  concentration of SARS-CoV-2 viral copies this assay can detect is 250  copies / mL. A negative result does not preclude SARS-CoV-2 infection  and should not be used as the sole basis for treatment or other  patient management decisions.  A negative result may occur with  improper specimen collection / handling, submission of specimen other  than nasopharyngeal swab, presence of viral mutation(s) within the  areas targeted by this assay, and inadequate number of viral copies  (<250 copies / mL). A negative result must be combined with clinical  observations, patient history, and epidemiological information. If result is POSITIVE SARS-CoV-2 target nucleic acids are DETECTED. The SARS-CoV-2 RNA is generally detectable in upper and lower  respiratory specimens dur ing the acute phase of infection.  Positive  results are indicative of active infection with SARS-CoV-2.  Clinical  correlation with patient history and other diagnostic information is  necessary to determine patient infection status.  Positive results do  not rule out bacterial infection or co-infection with other viruses. If result is PRESUMPTIVE POSTIVE SARS-CoV-2 nucleic acids MAY BE PRESENT.   A presumptive positive result was obtained on the submitted  specimen  and confirmed on repeat testing.  While 2019 novel coronavirus  (SARS-CoV-2) nucleic acids may be present in the submitted sample  additional confirmatory testing may be necessary for epidemiological  and / or clinical management purposes  to differentiate between  SARS-CoV-2 and other Sarbecovirus currently known to infect humans.  If clinically indicated additional testing with an alternate test  methodology [phone]) is advised. The SARS-CoV-2 RNA is generally  detectable in upper and lower respiratory sp ecimens during the acute  phase of infection. The expected result is Negative. Fact Sheet for Patients:  StrictlyIdeas.no Fact Sheet for Healthcare Providers: BankingDealers.co.za This test is not yet approved or cleared by the [place] FDA and has been authorized for detection and/or diagnosis of SARS-CoV-2 by FDA under an Emergency Use Authorization (EUA).  This EUA will remain in effect (meaning this test can be used) for the duration of the COVID-19 declaration under Section 564(b)(1) of the Act, 21 U.S.C. section 360bbb-3(b)(1), unless the authorization is terminated or revoked sooner. Performed at [facility], [place] [address], [place], [place] [address]    Ct Head Wo Contrast  Result Date: 03/26/2019 CLINICAL DATA:  Altered level of consciousness EXAM: CT HEAD WITHOUT CONTRAST TECHNIQUE: Contiguous axial images were obtained from  the base of the skull through the vertex without intravenous contrast. COMPARISON:  None. FINDINGS: Brain: Hypodensity within the left basal ganglia. No hemorrhage. No mass. Ventricles are nonenlarged. Small scattered foci of hypodensity within the subcortical white matter Vascular: No hyperdense vessels.  No unexpected calcification Skull: Normal. Negative for fracture or focal lesion. Sinuses/Orbits: No acute finding. Other: None IMPRESSION: 1. Focal hypodensity in the left basal  ganglia, may reflect acute or subacute lacunar infarct 2. Scattered vague foci of hypodensity within the subcortical white matter, possible small vessel ischemic change Electronically Signed   By: Donavan Foil M.D.   On: 03/26/2019 22:52   Mr Angio Head Wo Contrast  Result Date: 03/27/2019 CLINICAL DATA:  Syncope with stroke suspected EXAM: MR HEAD WITHOUT CONTRAST MR CIRCLE OF WILLIS WITHOUT CONTRAST MRA OF THE NECK WITHOUT AND WITH CONTRAST TECHNIQUE: Multiplanar, multiecho pulse sequences of the brain, circle of willis and surrounding structures were obtained without intravenous contrast. Angiographic images of the neck were obtained using MRA technique without and with intravenous contrast. CONTRAST:  10 cc Gadavist intravenous COMPARISON:  Head CT from yesterday FINDINGS: MR HEAD FINDINGS Brain: No acute infarction, hemorrhage, hydrocephalus, extra-axial collection or mass lesion. Patchy FLAIR hyperintensity in the cerebral white matter and pons most likely related to chronic small vessel ischemia. Mild FLAIR hyperintensity at the anterior left putamen and anterior limb internal capsule correlates with head CT and is chronic. Normal brain volume Vascular: Normal dural venous sinus flow voids. Arterial findings below Skull and upper cervical spine: Negative for marrow lesion Sinuses/Orbits: Negative MR CIRCLE OF WILLIS FINDINGS Carotid, vertebral, and basilar arteries are smooth and widely patent. No major branch occlusion. No beading or aneurysm. Hypoplastic right P1 segment. Suspect mild atheromatous narrowing of medium size vessels bilaterally. MRA NECK FINDINGS Normal arch. Three vessel branching. No flow limiting stenosis, ulceration, beading, or aneurysm. 40% narrowing at the right vertebral origin. IMPRESSION: Brain MRI: 1. No acute finding. 2. Mild presumed chronic small vessel ischemia. Intracranial MRA: 1. No emergent finding or flow limiting stenosis. 2. Mild atheromatous changes of medium size  vessels. Neck MRA: 40% narrowing at the right vertebral origin.  Otherwise negative. Electronically Signed   By: Monte Fantasia M.D.   On: 03/27/2019 10:09   Mr Angiogram Neck W Or Wo Contrast  Result Date: 03/27/2019 CLINICAL DATA:  Syncope with stroke suspected EXAM: MR HEAD WITHOUT CONTRAST MR CIRCLE OF WILLIS WITHOUT CONTRAST MRA OF THE NECK WITHOUT AND WITH CONTRAST TECHNIQUE: Multiplanar, multiecho pulse sequences of the brain, circle of willis and surrounding structures were obtained without intravenous contrast. Angiographic images of the neck were obtained using MRA technique without and with intravenous contrast. CONTRAST:  10 cc Gadavist intravenous COMPARISON:  Head CT from yesterday FINDINGS: MR HEAD FINDINGS Brain: No acute infarction, hemorrhage, hydrocephalus, extra-axial collection or mass lesion. Patchy FLAIR hyperintensity in the cerebral white matter and pons most likely related to chronic small vessel ischemia. Mild FLAIR hyperintensity at the anterior left putamen and anterior limb internal capsule correlates with head CT and is chronic. Normal brain volume Vascular: Normal dural venous sinus flow voids. Arterial findings below Skull and upper cervical spine: Negative for marrow lesion Sinuses/Orbits: Negative MR CIRCLE OF WILLIS FINDINGS Carotid, vertebral, and basilar arteries are smooth and widely patent. No major branch occlusion. No beading or aneurysm. Hypoplastic right P1 segment. Suspect mild atheromatous narrowing of medium size vessels bilaterally. MRA NECK FINDINGS Normal arch. Three vessel branching. No flow limiting stenosis, ulceration, beading, or aneurysm.  40% narrowing at the right vertebral origin. IMPRESSION: Brain MRI: 1. No acute finding. 2. Mild presumed chronic small vessel ischemia. Intracranial MRA: 1. No emergent finding or flow limiting stenosis. 2. Mild atheromatous changes of medium size vessels. Neck MRA: 40% narrowing at the right vertebral origin.  Otherwise  negative. Electronically Signed   By: Monte Fantasia M.D.   On: 03/27/2019 10:09   Mr Brain Wo Contrast  Result Date: 03/27/2019 CLINICAL DATA:  Syncope with stroke suspected EXAM: MR HEAD WITHOUT CONTRAST MR CIRCLE OF WILLIS WITHOUT CONTRAST MRA OF THE NECK WITHOUT AND WITH CONTRAST TECHNIQUE: Multiplanar, multiecho pulse sequences of the brain, circle of willis and surrounding structures were obtained without intravenous contrast. Angiographic images of the neck were obtained using MRA technique without and with intravenous contrast. CONTRAST:  10 cc Gadavist intravenous COMPARISON:  Head CT from yesterday FINDINGS: MR HEAD FINDINGS Brain: No acute infarction, hemorrhage, hydrocephalus, extra-axial collection or mass lesion. Patchy FLAIR hyperintensity in the cerebral white matter and pons most likely related to chronic small vessel ischemia. Mild FLAIR hyperintensity at the anterior left putamen and anterior limb internal capsule correlates with head CT and is chronic. Normal brain volume Vascular: Normal dural venous sinus flow voids. Arterial findings below Skull and upper cervical spine: Negative for marrow lesion Sinuses/Orbits: Negative MR CIRCLE OF WILLIS FINDINGS Carotid, vertebral, and basilar arteries are smooth and widely patent. No major branch occlusion. No beading or aneurysm. Hypoplastic right P1 segment. Suspect mild atheromatous narrowing of medium size vessels bilaterally. MRA NECK FINDINGS Normal arch. Three vessel branching. No flow limiting stenosis, ulceration, beading, or aneurysm. 40% narrowing at the right vertebral origin. IMPRESSION: Brain MRI: 1. No acute finding. 2. Mild presumed chronic small vessel ischemia. Intracranial MRA: 1. No emergent finding or flow limiting stenosis. 2. Mild atheromatous changes of medium size vessels. Neck MRA: 40% narrowing at the right vertebral origin.  Otherwise negative. Electronically Signed   By: Monte Fantasia M.D.   On: 03/27/2019 10:09   Dg  Foot Complete Left  Result Date: 03/27/2019 CLINICAL DATA:  LEFT foot [name] after fall. EXAM: LEFT FOOT - COMPLETE 3+ VIEW COMPARISON:  None. FINDINGS: No fracture or dislocation of mid foot or forefoot. The phalanges are normal. The calcaneus is normal. No soft tissue abnormality. Smoothly marginated dense sclerotic lesion in the distal aspect of the third metatarsal has benign features. IMPRESSION: 1.  No acute osseous abnormality. 2. Benign Osseous lesion in the third metatarsal. Favor nonossifying fibroma. Electronically Signed   By: Suzy Bouchard M.D.   On: 03/27/2019 10:03    ROS  Blood pressure (!) 167/64, pulse (!) 56, temperature 98.2 F (36.8 C), temperature source Oral, resp. rate (!) 9, SpO2 100 %. 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 She will need PPM insertion as she has had these episodes in the absence of any AV nodal blocking agents. I have reviewed the indications/risks/benefits/goals/expectations of PPM insertion with the patient and her daughter and they are willing to proceed.   Tawnee Clegg,M.D. 2. Obesity - she will need to lose weight.   Cristopher Peru 03/27/2019, 11:36 AM

## 2019-03-27 NOTE — ED Provider Notes (Signed)
[facility] Provider Note   CSN: [id number] Arrival date & time: 03/26/19  1916    History   Chief Complaint Chief Complaint  Patient presents with  . Loss of Consciousness    HPI Anita [name] Pain is a 63 y.o. female.     Patient presents to the emergency department for evaluation of syncope.  Patient is currently under evaluation by cardiology because of multiple episodes of presyncope.  She is wearing a 30-day event monitor.  Today she was getting ready to go into the shower when she passed out.  She completely lost consciousness and fell to the ground.  She complains of headache and left foot pain since the fall.  No neck pain.  She did not have any precursor chest pain, heart palpitations, shortness of breath.  Patient is Spanish-speaking, daughter translates.  Daughter reports that the event monitor company called her while she was in the waiting room and informed her that there was some kind of finding.     Past Medical History:  Diagnosis Date  . ABDOMINAL PAIN 05/05/2009   Qualifier: Diagnosis of  By: Jorene Minors, Scott    . Adjustment disorder with depressed mood 08/30/2009   Qualifier: Diagnosis of  By: Jorene Minors, Scott    . ALLERGIC RHINITIS 12/04/2009   Qualifier: Diagnosis of  By: Jorene Minors, Scott    . Allergy   . Aortic stenosis    mild by echo 03/2019  . CHOLELITHIASIS 05/30/2009   Qualifier: Diagnosis of  By: Jorene Minors, Scott    . COLONIC POLYPS, HX OF 05/09/2010   Qualifier: Diagnosis of  By: Jorene Minors, Scott    . Essential hypertension, benign 05/05/2009   Qualifier: Diagnosis of  By: Jorene Minors, Scott    . GERD 06/12/2009   Qualifier: Diagnosis of  By: Jorene Minors, Scott    . Hypertension   . KNEE PAIN 06/12/2009   Qualifier: Diagnosis of  By: Jorene Minors, Scott    . LIVER MASS 05/30/2009   Qualifier: Diagnosis of  By: Jorene Minors, Scott    . OBESITY 10/24/2010   Qualifier: Diagnosis of  By: Keenan Bachelor RN, Rip Harbour     . TINNITUS, CHRONIC, RIGHT 09/07/2010   Qualifier: Diagnosis of  By: Amil Amen MD, Benjamine Mola    . TOOTH LOSS 09/07/2010   Qualifier: Diagnosis of  By: Amil Amen MD, Benjamine Mola    . 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 VARICOSE VEINS, LOWER EXTREMITIES 05/05/2009   Qualifier: Diagnosis of  By: Jorene Minors, Scott      Patient Active Problem List   Diagnosis Date Noted  . Aortic stenosis   . BMI 45.0-49.9, adult ([place]) 10/19/2014  . Venous stasis dermatitis of left lower extremity 10/19/2014  . OBESITY 10/24/2010  . TINNITUS, CHRONIC, RIGHT 09/07/2010  . TOOTH LOSS 09/07/2010  . COLONIC POLYPS, HX OF 05/09/2010  . ALLERGIC RHINITIS 12/04/2009  . 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 ADJUSTMENT DISORDER WITH DEPRESSED MOOD 08/30/2009  . GERD 06/12/2009  . KNEE PAIN 06/12/2009  . LIVER MASS 05/30/2009  . CHOLELITHIASIS 05/30/2009  . ESSENTIAL HYPERTENSION, BENIGN 05/05/2009  . 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 ABDOMINAL PAIN 05/05/2009    Past Surgical History:  Procedure Laterality Date  . ABDOMINAL HYSTERECTOMY    . CESAREAN SECTION       OB History   No obstetric history on file.      Home Medications  Prior to Admission medications   Medication Sig Start Date End Date Taking? Authorizing Provider  carvedilol (COREG) 6.25 MG tablet Take 1 tablet (6.25 mg total) by mouth 2 (two) times daily. 03/11/19   Sueanne Margarita, MD  chlorthalidone (HYGROTON) 25 MG tablet Take 1 tablet (25 mg total) by mouth daily. 03/11/19 06/09/19  Sueanne Margarita, MD  valsartan (DIOVAN) 80 MG tablet Take 1 tablet (80 mg total) by mouth daily. 03/23/19   Supple, Harlon Flor, RPH    Family History Family History  Problem Relation Age of Onset  . Hypertension Sister   . Hyperlipidemia Sister   . Hypertension Brother   . Hyperlipidemia Brother   . Migraines Daughter   . Hypertension Sister   . Hyperlipidemia Sister   . Cancer Sister    . Hypertension Sister   . Hyperlipidemia Sister   . Hypertension Brother   . Hyperlipidemia Brother   . Hypertension Brother   . Hyperlipidemia Brother   . Hypertension Brother   . Hyperlipidemia Brother   . Hypertension Brother   . Hyperlipidemia Brother   . Diabetes Paternal Grandmother   . Hypertension Paternal Grandmother     Social History Social History   Tobacco Use  . Smoking status: Never Smoker  . Smokeless tobacco: Never Used  Substance Use Topics  . Alcohol use: No    Alcohol/week: 0.0 standard drinks  . Drug use: No     Allergies   Patient has no known allergies.   Review of Systems Review of Systems  Musculoskeletal: Positive for arthralgias (foot pain).  Neurological: Positive for syncope and headaches.  All other systems reviewed and are negative.    Physical Exam Updated Vital Signs BP (!) 154/66 (BP Location: Right Arm)   Pulse (!) 54   Temp 98.2 F (36.8 C) (Oral)   Resp 18   SpO2 100%   Physical Exam Vitals signs and nursing note reviewed.  Constitutional:      General: She is not in acute distress.    Appearance: Normal appearance. She is well-developed.  HENT:     Head: Normocephalic and atraumatic.     Right Ear: Hearing normal.     Left Ear: Hearing normal.     Nose: Nose normal.  Eyes:     Conjunctiva/sclera: Conjunctivae normal.     Pupils: Pupils are equal, round, and reactive to light.  Neck:     Musculoskeletal: Normal range of motion and neck supple.  Cardiovascular:     Rate and Rhythm: Regular rhythm.     Heart sounds: S1 normal and S2 normal. No murmur. No friction rub. No gallop.   Pulmonary:     Effort: Pulmonary effort is normal. No respiratory distress.     Breath sounds: Normal breath sounds.  Chest:     Chest wall: No tenderness.  Abdominal:     General: Bowel sounds are normal.     Palpations: Abdomen is soft.     Tenderness: There is no abdominal tenderness. There is no guarding or rebound. Negative  signs include Murphy's sign and McBurney's sign.     Hernia: No hernia is present.  Musculoskeletal: Normal range of motion.  Skin:    General: Skin is warm and dry.     Findings: No rash.  Neurological:     Mental Status: She is alert and oriented to person, place, and time.     GCS: GCS eye subscore is 4. GCS verbal subscore is 5. GCS motor subscore  is 6.     Cranial Nerves: No cranial nerve deficit.     Sensory: No sensory deficit.     Coordination: Coordination normal.     Comments: Extraocular muscle movement: normal No visual field cut Pupils: equal and reactive both direct and consensual response is normal No nystagmus present    Sensory function is intact to light touch, pinprick Proprioception intact  Grip strength 5/5 symmetric in upper extremities No pronator drift Normal finger to nose bilaterally  Lower extremity strength 5/5 against gravity Normal heel to shin bilaterally    Psychiatric:        Speech: Speech normal.        Behavior: Behavior normal.        Thought Content: Thought content normal.      ED Treatments / Results  Labs (all labs ordered are listed, but only abnormal results are displayed) Labs Reviewed  COMPREHENSIVE METABOLIC PANEL - Abnormal; Notable for the following components:      Result Value   Potassium 3.2 (*)    Glucose, Bld 118 (*)    Creatinine, Ser 1.26 (*)    GFR calc non Af Amer 45 (*)    GFR calc Af Amer 52 (*)    All other components within normal limits  I-STAT CHEM 8, ED - Abnormal; Notable for the following components:   Potassium 3.0 (*)    Creatinine, Ser 1.30 (*)    Glucose, Bld 113 (*)    Calcium, Ion 1.12 (*)    All other components within normal limits  PROTIME-INR  APTT  CBC  DIFFERENTIAL  CBG MONITORING, ED    EKG EKG Interpretation  Date/Time:  Friday March 26 2019 19:28:09 EDT Ventricular Rate:  73 PR Interval:  152 QRS Duration: 146 QT Interval:  428 QTC Calculation: 471 R Axis:   136  Text Interpretation:  Normal sinus rhythm Right bundle branch block Left posterior fascicular block Bifascicular block  Abnormal ECG Confirmed by Orpah Greek [phone]) on 03/27/2019 6:18:11 AM   Radiology Ct Head Wo Contrast  Result Date: 03/26/2019 CLINICAL DATA:  Altered level of consciousness EXAM: CT HEAD WITHOUT CONTRAST TECHNIQUE: Contiguous axial images were obtained from the base of the skull through the vertex without intravenous contrast. COMPARISON:  None. FINDINGS: Brain: Hypodensity within the left basal ganglia. No hemorrhage. No mass. Ventricles are nonenlarged. Small scattered foci of hypodensity within the subcortical white matter Vascular: No hyperdense vessels.  No unexpected calcification Skull: Normal. Negative for fracture or focal lesion. Sinuses/Orbits: No acute finding. Other: None IMPRESSION: 1. Focal hypodensity in the left basal ganglia, may reflect acute or subacute lacunar infarct 2. Scattered vague foci of hypodensity within the subcortical white matter, possible small vessel ischemic change Electronically Signed   By: Donavan Foil M.D.   On: 03/26/2019 22:52    Procedures Procedures (including critical care time)  Medications Ordered in ED Medications  sodium chloride flush (NS) 0.9 % injection 3 mL (has no administration in time range)     Initial Impression / Assessment and Plan / ED Course  I have reviewed the triage vital signs and the nursing notes.  Pertinent labs & imaging results that were available during my care of the patient were reviewed by me and considered in my medical decision making (see chart for details).        Patient presents to the emergency department for evaluation of syncope.  Patient has been experiencing episodes of presyncope over the last several weeks.  She has been seen at urgent care and in the ER.  Patient also has had outpatient referral to cardiology and is currently wearing a 30-day event monitor.  She also has  an extensive work-up planned secondary to her presyncope but the studies are pending.  Patient complaining of headache after the fall.  She had a CT scan performed through triage that showed no evidence of injury but raises concern for acute or subacute lacunar infarct.  Will require MRI.  We will also perform MRA because of her previous symptoms.  Additionally, patient tells me that the monitor company called while they were in the waiting room reporting some type of event.  Will need to contact monitor company to determine if there was a rhythm event that caused the syncope.  Final Clinical Impressions(s) / ED Diagnoses   Final diagnoses:  Syncope, unspecified syncope type    ED Discharge Orders    None       Orpah Greek, MD 03/27/19 [phone]

## 2019-03-28 LAB — BASIC METABOLIC PANEL
Anion gap: 10 (ref 5–15)
BUN: 16 mg/dL (ref 8–23)
CO2: 26 mmol/L (ref 22–32)
Calcium: 9.1 mg/dL (ref 8.9–10.3)
Chloride: 104 mmol/L (ref 98–111)
Creatinine, Ser: 0.77 mg/dL (ref 0.44–1.00)
GFR calc Af Amer: >60 mL/min (ref >60)
GFR calc non Af Amer: >60 mL/min (ref >60)
Glucose, Bld: 106 mg/dL — ABNORMAL HIGH (ref 70–99)
Potassium: 3.8 mmol/L (ref 3.5–5.1)
Sodium: 140 mmol/L (ref 135–145)

## 2019-03-28 NOTE — Plan of Care (Signed)
Poc progressing.  

## 2019-03-28 NOTE — Progress Notes (Signed)
Progress Note  Patient Name: Anita [name] Date of Encounter: 03/28/2019  Primary Cardiologist: No primary care provider on file.   Subjective   No chest pain or sob. History/interpretation provided by her daughter.  Inpatient Medications    Scheduled Meds: . heparin  5,000 Units Subcutaneous Q8H  . irbesartan  75 mg Oral Daily   Continuous Infusions:  PRN Meds:    Vital Signs    Vitals:   03/27/19 1927 03/27/19 2335 03/28/19 0257 03/28/19 0758  BP: 130/60 115/68 (!) 151/72 139/66  Pulse: 64 (!) 56 63 (!) 56  Resp: 11 14 19 10   Temp: 98.6 F (37 C) 98.6 F (37 C) 98.3 F (36.8 C) 98 F (36.7 C)  TempSrc: Oral Oral Oral Oral  SpO2: 98% 98% 97% 96%  Weight:   108.5 kg   Height:        Intake/Output Summary (Last 24 hours) at 03/28/2019 1051 Last data filed at 03/28/2019 0839 Gross per 24 hour  Intake 100 ml  Output -  Net 100 ml   Filed Weights   03/28/19 0257  Weight: 108.5 kg    Telemetry    nsr - Personally Reviewed  ECG    NSR with RBBB - Personally Reviewed  Physical Exam   GEN: No acute distress.   Neck: No JVD Cardiac: RRR, no murmurs, rubs, or gallops.  Respiratory: Clear to auscultation bilaterally. GI: Soft, nontender, obese, non-distended  MS: No edema; No deformity. Neuro:  Nonfocal  Psych: Normal affect   Labs    Chemistry Recent Labs  Lab 03/26/19 2020 03/26/19 2035 03/27/19 2016 03/28/19 0303  NA 138 136 137 140  K 3.2* 3.0* 3.5 3.8  CL 98 98 101 104  CO2 27  --  25 26  GLUCOSE 118* 113* 133* 106*  BUN 16 19 16 16   CREATININE 1.26* 1.30* 0.75 0.77  CALCIUM 9.5  --  9.2 9.1  PROT 7.7  --   --   --   ALBUMIN 4.1  --   --   --   AST 23  --   --   --   ALT 22  --   --   --   ALKPHOS 74  --   --   --   BILITOT 0.6  --   --   --   GFRNONAA 45*  --  >60 >60  GFRAA 52*  --  >60 >60  ANIONGAP 13  --  11 10     Hematology Recent Labs  Lab 03/26/19 2020 03/26/19 2035  WBC 8.1  --   RBC 4.89  --   HGB 14.7  14.3  HCT 43.3 42.0  MCV 88.5  --   MCH 30.1  --   MCHC 33.9  --   RDW 12.4  --   PLT 234  --     Cardiac EnzymesNo results for input(s): TROPONINI in the last 168 hours. No results for input(s): TROPIPOC in the last 168 hours.   BNPNo results for input(s): BNP, PROBNP in the last 168 hours.   DDimer No results for input(s): DDIMER in the last 168 hours.   Radiology    Ct Head Wo Contrast  Result Date: 03/26/2019 CLINICAL DATA:  Altered level of consciousness EXAM: CT HEAD WITHOUT CONTRAST TECHNIQUE: Contiguous axial images were obtained from the base of the skull through the vertex without intravenous contrast. COMPARISON:  None. FINDINGS: Brain: Hypodensity within the left basal ganglia. No hemorrhage. No mass.  Ventricles are nonenlarged. Small scattered foci of hypodensity within the subcortical white matter Vascular: No hyperdense vessels.  No unexpected calcification Skull: Normal. Negative for fracture or focal lesion. Sinuses/Orbits: No acute finding. Other: None IMPRESSION: 1. Focal hypodensity in the left basal ganglia, may reflect acute or subacute lacunar infarct 2. Scattered vague foci of hypodensity within the subcortical white matter, possible small vessel ischemic change Electronically Signed   By: Donavan Foil M.D.   On: 03/26/2019 22:52   Mr Angio Head Wo Contrast  Result Date: 03/27/2019 CLINICAL DATA:  Syncope with stroke suspected EXAM: MR HEAD WITHOUT CONTRAST MR CIRCLE OF WILLIS WITHOUT CONTRAST MRA OF THE NECK WITHOUT AND WITH CONTRAST TECHNIQUE: Multiplanar, multiecho pulse sequences of the brain, circle of willis and surrounding structures were obtained without intravenous contrast. Angiographic images of the neck were obtained using MRA technique without and with intravenous contrast. CONTRAST:  10 cc Gadavist intravenous COMPARISON:  Head CT from yesterday FINDINGS: MR HEAD FINDINGS Brain: No acute infarction, hemorrhage, hydrocephalus, extra-axial collection or  mass lesion. Patchy FLAIR hyperintensity in the cerebral white matter and pons most likely related to chronic small vessel ischemia. Mild FLAIR hyperintensity at the anterior left putamen and anterior limb internal capsule correlates with head CT and is chronic. Normal brain volume Vascular: Normal dural venous sinus flow voids. Arterial findings below Skull and upper cervical spine: Negative for marrow lesion Sinuses/Orbits: Negative MR CIRCLE OF WILLIS FINDINGS Carotid, vertebral, and basilar arteries are smooth and widely patent. No major branch occlusion. No beading or aneurysm. Hypoplastic right P1 segment. Suspect mild atheromatous narrowing of medium size vessels bilaterally. MRA NECK FINDINGS Normal arch. Three vessel branching. No flow limiting stenosis, ulceration, beading, or aneurysm. 40% narrowing at the right vertebral origin. IMPRESSION: Brain MRI: 1. No acute finding. 2. Mild presumed chronic small vessel ischemia. Intracranial MRA: 1. No emergent finding or flow limiting stenosis. 2. Mild atheromatous changes of medium size vessels. Neck MRA: 40% narrowing at the right vertebral origin.  Otherwise negative. Electronically Signed   By: Monte Fantasia M.D.   On: 03/27/2019 10:09   Mr Angiogram Neck W Or Wo Contrast  Result Date: 03/27/2019 CLINICAL DATA:  Syncope with stroke suspected EXAM: MR HEAD WITHOUT CONTRAST MR CIRCLE OF WILLIS WITHOUT CONTRAST MRA OF THE NECK WITHOUT AND WITH CONTRAST TECHNIQUE: Multiplanar, multiecho pulse sequences of the brain, circle of willis and surrounding structures were obtained without intravenous contrast. Angiographic images of the neck were obtained using MRA technique without and with intravenous contrast. CONTRAST:  10 cc Gadavist intravenous COMPARISON:  Head CT from yesterday FINDINGS: MR HEAD FINDINGS Brain: No acute infarction, hemorrhage, hydrocephalus, extra-axial collection or mass lesion. Patchy FLAIR hyperintensity in the cerebral white matter and  pons most likely related to chronic small vessel ischemia. Mild FLAIR hyperintensity at the anterior left putamen and anterior limb internal capsule correlates with head CT and is chronic. Normal brain volume Vascular: Normal dural venous sinus flow voids. Arterial findings below Skull and upper cervical spine: Negative for marrow lesion Sinuses/Orbits: Negative MR CIRCLE OF WILLIS FINDINGS Carotid, vertebral, and basilar arteries are smooth and widely patent. No major branch occlusion. No beading or aneurysm. Hypoplastic right P1 segment. Suspect mild atheromatous narrowing of medium size vessels bilaterally. MRA NECK FINDINGS Normal arch. Three vessel branching. No flow limiting stenosis, ulceration, beading, or aneurysm. 40% narrowing at the right vertebral origin. IMPRESSION: Brain MRI: 1. No acute finding. 2. Mild presumed chronic small vessel ischemia. Intracranial MRA: 1. No emergent  finding or flow limiting stenosis. 2. Mild atheromatous changes of medium size vessels. Neck MRA: 40% narrowing at the right vertebral origin.  Otherwise negative. Electronically Signed   By: Monte Fantasia M.D.   On: 03/27/2019 10:09   Mr Brain Wo Contrast  Result Date: 03/27/2019 CLINICAL DATA:  Syncope with stroke suspected EXAM: MR HEAD WITHOUT CONTRAST MR CIRCLE OF WILLIS WITHOUT CONTRAST MRA OF THE NECK WITHOUT AND WITH CONTRAST TECHNIQUE: Multiplanar, multiecho pulse sequences of the brain, circle of willis and surrounding structures were obtained without intravenous contrast. Angiographic images of the neck were obtained using MRA technique without and with intravenous contrast. CONTRAST:  10 cc Gadavist intravenous COMPARISON:  Head CT from yesterday FINDINGS: MR HEAD FINDINGS Brain: No acute infarction, hemorrhage, hydrocephalus, extra-axial collection or mass lesion. Patchy FLAIR hyperintensity in the cerebral white matter and pons most likely related to chronic small vessel ischemia. Mild FLAIR hyperintensity at  the anterior left putamen and anterior limb internal capsule correlates with head CT and is chronic. Normal brain volume Vascular: Normal dural venous sinus flow voids. Arterial findings below Skull and upper cervical spine: Negative for marrow lesion Sinuses/Orbits: Negative MR CIRCLE OF WILLIS FINDINGS Carotid, vertebral, and basilar arteries are smooth and widely patent. No major branch occlusion. No beading or aneurysm. Hypoplastic right P1 segment. Suspect mild atheromatous narrowing of medium size vessels bilaterally. MRA NECK FINDINGS Normal arch. Three vessel branching. No flow limiting stenosis, ulceration, beading, or aneurysm. 40% narrowing at the right vertebral origin. IMPRESSION: Brain MRI: 1. No acute finding. 2. Mild presumed chronic small vessel ischemia. Intracranial MRA: 1. No emergent finding or flow limiting stenosis. 2. Mild atheromatous changes of medium size vessels. Neck MRA: 40% narrowing at the right vertebral origin.  Otherwise negative. Electronically Signed   By: Monte Fantasia M.D.   On: 03/27/2019 10:09   Dg Foot Complete Left  Result Date: 03/27/2019 CLINICAL DATA:  LEFT foot pain after fall. EXAM: LEFT FOOT - COMPLETE 3+ VIEW COMPARISON:  None. FINDINGS: No fracture or dislocation of mid foot or forefoot. The phalanges are normal. The calcaneus is normal. No soft tissue abnormality. Smoothly marginated dense sclerotic lesion in the distal aspect of the third metatarsal has benign features. IMPRESSION: 1.  No acute osseous abnormality. 2. Benign Osseous lesion in the third metatarsal. Favor nonossifying fibroma. Electronically Signed   By: Suzy Bouchard M.D.   On: 03/27/2019 10:03    Cardiac Studies   none  Patient Profile     63 y.o. female admitted with syncope and found to have pauses of up to 18 seconds in setting of RBBB  Assessment & Plan    1.[place] syncope - she is stable today with no recurrent pauses or syncope. She will undergo PPM insertion  tomorrow. 2. HTN - her blood pressure has been a little high. I would anticipate starting her on a beta blocker.   For questions or updates, please contact [facility] Please consult [email or web address] for contact info under Cardiology/STEMI.      Signed, Cristopher Peru, MD  03/28/2019, 10:51 AM  Patient ID: Anita [name], female   DOB: [birth year], 63 y.o.   MRN: [id number]

## 2019-03-29 ENCOUNTER — Inpatient Hospital Stay (HOSPITAL_COMMUNITY)
Admission: EM | Disposition: A | Discharge status: Home / Self Care | Payer: Self-pay | Source: Home / Self Care | Attending: Internal Medicine

## 2019-03-29 ENCOUNTER — Other Ambulatory Visit: Payer: Self-pay

## 2019-03-29 ENCOUNTER — Encounter (HOSPITAL_COMMUNITY): Payer: Self-pay

## 2019-03-29 DIAGNOSIS — I459 Conduction disorder, unspecified: Secondary | ICD-10-CM

## 2019-03-29 HISTORY — PX: PACEMAKER IMPLANT: EP1218

## 2019-03-29 LAB — SURGICAL PCR SCREEN
MRSA, PCR: NEGATIVE
Staphylococcus aureus: NEGATIVE

## 2019-03-29 SURGERY — PACEMAKER IMPLANT
Anesthesia: LOCAL

## 2019-03-29 MED ORDER — IOHEXOL 350 MG/ML SOLN
INTRAVENOUS | Status: DC | PRN
  Administered 2019-03-29: 10 mL via INTRAVENOUS

## 2019-03-29 MED ORDER — HEPARIN (PORCINE) IN NACL 1000-0.9 UT/500ML-% IV SOLN
INTRAVENOUS | Status: AC
  Filled 2019-03-29: qty 500

## 2019-03-29 MED ORDER — SODIUM CHLORIDE 0.9% FLUSH
3.0000 mL | Freq: Two times a day (BID) | INTRAVENOUS | Status: DC
  Administered 2019-03-29 – 2019-03-30 (×3): 3 mL via INTRAVENOUS

## 2019-03-29 MED ORDER — LIDOCAINE HCL (PF) 1 % IJ SOLN
INTRAMUSCULAR | Status: DC | PRN
  Administered 2019-03-29: 45 mL

## 2019-03-29 MED ORDER — CEFAZOLIN SODIUM-DEXTROSE 2-4 GM/100ML-% IV SOLN
2.0000 g | INTRAVENOUS | Status: AC
  Administered 2019-03-29: 2 g via INTRAVENOUS

## 2019-03-29 MED ORDER — FENTANYL CITRATE (PF) 100 MCG/2ML IJ SOLN
INTRAMUSCULAR | Status: DC | PRN
  Administered 2019-03-29 (×3): 12.5 ug via INTRAVENOUS

## 2019-03-29 MED ORDER — MIDAZOLAM HCL 5 MG/5ML IJ SOLN
INTRAMUSCULAR | Status: DC | PRN
  Administered 2019-03-29 (×3): 1 mg via INTRAVENOUS

## 2019-03-29 MED ORDER — LIDOCAINE HCL 1 % IJ SOLN
INTRAMUSCULAR | Status: AC
  Filled 2019-03-29: qty 60

## 2019-03-29 MED ORDER — SODIUM CHLORIDE 0.9% FLUSH: 3.0000 mL | INTRAVENOUS | Status: DC | PRN

## 2019-03-29 MED ORDER — CEFAZOLIN SODIUM-DEXTROSE 1-4 GM/50ML-% IV SOLN
1.0000 g | Freq: Four times a day (QID) | INTRAVENOUS | Status: AC
  Administered 2019-03-29 – 2019-03-30 (×3): 1 g via INTRAVENOUS
  Filled 2019-03-29 (×3): qty 50

## 2019-03-29 MED ORDER — SODIUM CHLORIDE 0.9 % IV SOLN
INTRAVENOUS | Status: AC
  Filled 2019-03-29: qty 2

## 2019-03-29 MED ORDER — ACETAMINOPHEN 325 MG PO TABS
325.0000 mg | ORAL_TABLET | ORAL | Status: DC | PRN
  Administered 2019-03-29 – 2019-03-30 (×3): 650 mg via ORAL
  Filled 2019-03-29 (×3): qty 2

## 2019-03-29 MED ORDER — HEPARIN (PORCINE) IN NACL 1000-0.9 UT/500ML-% IV SOLN
INTRAVENOUS | Status: DC | PRN
  Administered 2019-03-29: 500 mL

## 2019-03-29 MED ORDER — MIDAZOLAM HCL 5 MG/5ML IJ SOLN
INTRAMUSCULAR | Status: AC
  Filled 2019-03-29: qty 5

## 2019-03-29 MED ORDER — FENTANYL CITRATE (PF) 100 MCG/2ML IJ SOLN
INTRAMUSCULAR | Status: AC
  Filled 2019-03-29: qty 2

## 2019-03-29 MED ORDER — ONDANSETRON HCL 4 MG/2ML IJ SOLN: 4.0000 mg | Freq: Four times a day (QID) | INTRAMUSCULAR | Status: DC | PRN

## 2019-03-29 MED ORDER — CHLORHEXIDINE GLUCONATE 4 % EX LIQD
60.0000 mL | Freq: Once | CUTANEOUS | Status: DC
  Filled 2019-03-29: qty 60

## 2019-03-29 MED ORDER — SODIUM CHLORIDE 0.9 % IV SOLN
INTRAVENOUS | Status: DC
  Administered 2019-03-29: 11:00:00 via INTRAVENOUS

## 2019-03-29 MED ORDER — CHLORHEXIDINE GLUCONATE 4 % EX LIQD
60.0000 mL | Freq: Once | CUTANEOUS | Status: AC
  Administered 2019-03-29: 4 via TOPICAL

## 2019-03-29 MED ORDER — CEFAZOLIN SODIUM-DEXTROSE 2-4 GM/100ML-% IV SOLN
INTRAVENOUS | Status: AC
  Filled 2019-03-29: qty 100

## 2019-03-29 MED ORDER — SODIUM CHLORIDE 0.9 % IV SOLN: 250.0000 mL | INTRAVENOUS | Status: DC

## 2019-03-29 MED ORDER — SODIUM CHLORIDE 0.9 % IV SOLN
80.0000 mg | INTRAVENOUS | Status: AC
  Administered 2019-03-29: 16:00:00 80 mg

## 2019-03-29 SURGICAL SUPPLY — 12 items
CABLE SURGICAL S-101-97-12 (CABLE) ×2 IMPLANT
CATH RIGHTSITE C315HIS02 (CATHETERS) ×1 IMPLANT
IPG PACE AZUR XT DR MRI W1DR01 (Pacemaker) IMPLANT
LEAD CAPSURE NOVUS 5076-52CM (Lead) ×1 IMPLANT
LEAD SELECT SECURE 3830 383069 (Lead) IMPLANT
PACE AZURE XT DR MRI W1DR01 (Pacemaker) ×2 IMPLANT
PAD PRO RADIOLUCENT 2001M-C (PAD) ×2 IMPLANT
SELECT SECURE 3830 383069 (Lead) ×2 IMPLANT
SHEATH CLASSIC 7F (SHEATH) ×2 IMPLANT
SLITTER 6232ADJ (MISCELLANEOUS) ×1 IMPLANT
TRAY PACEMAKER INSERTION (PACKS) ×2 IMPLANT
WIRE HI TORQ VERSACORE-J 145CM (WIRE) ×1 IMPLANT

## 2019-03-29 NOTE — Plan of Care (Signed)
  Problem: Health Behavior/Discharge Planning: Goal: Ability to manage health-related needs will improve Outcome: Progressing   Problem: Clinical Measurements: Goal: Respiratory complications will improve Outcome: Progressing   Problem: Activity: Goal: Risk for activity intolerance will decrease Outcome: Progressing   

## 2019-03-29 NOTE — Discharge Summary (Addendum)
ELECTROPHYSIOLOGY PROCEDURE DISCHARGE SUMMARY    Patient ID: Anita [name],  MRN: [id number], DOB/AGE: 1956/06/08 63 y.o.  Admit date: 03/26/2019 Discharge date: 03/30/2019  Primary Care Physician: Patient, No Pcp Per  Primary Cardiologist: Dr. Radford Pax Electrophysiologist: Dr. Lovena Le (new)  Primary Discharge Diagnosis:  1. Sinus node dysfunction 2. Stokes adams syncope  Secondary Discharge Diagnosis:  1. HTN 2. RBBB 3. Obesity  No Known Allergies   Procedures This Admission:  1.  Implantation of a MDT dual chamber PPM on 03/29/2019 by Dr Lovena Le.  The patient received a medtronic (serial number [id number]) pacemaker, Medtronic (serial number [id number]) right atrial lead and a medtronic (serial number [id number]) right ventricular lead There were no immediate post procedure complications. 2.  CXR on 03/30/2019 demonstrated no pneumothorax status post device implantation.   Brief HPI: Anita [name] is a 63 y.o. female was referred to cardiology in the outpatient setting for evaluation of near syncope and HTN.  Past medical history includes above.  W/u outpatient included an event monitor that disclosed sinus pause of 18 seconds that was associated with syncope, and 5 seconds associated with dizzy spell, she was referred to the hospital   Hospital Course:  The patient in the ER had a CT head done noting a focal hypodensity in the left basal ganglia, may reflect acute or subacute lacunar infarct, MRI done following afterwards though was negative, Brain MRI: 1. No acute finding.2. Mild presumed chronic small vessel ischemia.. Intracranial MRA: 1. No emergent finding or flow limiting stenosis. 2. Mild atheromatous changes of medium size vessels.. Neck MRA: 40% narrowing at the right vertebral origin.  Otherwise negative. ,  She was admitted and recommended to undergo implantation of a PPM.  She was monitored over the weekend with no recurrent pauses or syncope.  No reversible  causes for her sinus node dysfunction were noted and had PPM implant 03/29/2019 with details as outlined above.  She was monitored on telemetry overnight which demonstrated SR.  Left chest was without hematoma or ecchymosis.  The device was interrogated and found to be functioning normally.  CXR was obtained and demonstrated no pneumothorax status post device implantation.  Wound care, arm mobility, and restrictions were reviewed with the patient.  The patient mentioned a hot sensation to the back of b/l arms, back, neck/posterior head during her stay.   In review of record, she called the office after being started on Lisinopril outpatient with this as a new complaint, she was changed to the current ARB. Dr. Lovena Le suspects this (ARB and previously the ACE) may be the culprit for this symptom.  Will stop her Valsartan and resume her home coreg at higher dose with her chlorthalidone given h/o HTN poorly controlled, TSH was checked and wnl.  She feels well otherwise,  no CP or SOB, she was examined by Dr. Lovena Le and considered stable for discharge to home.    Dr. Lovena Le saw the patient this AM with the aid of [employer] interpretor Myrna [phone], discussed medication change 2/2 her complaint, discussed wound care and activity restrictions as well using the interpretor.   I have also revisited and discussed with her daughter who speaks English very well, medication change, suspect her ARB to be culprit for the hot sensation, and wound care, activity restrictions.   EP follow up is in place HTN clinic f/u also in place  Physical Exam: Vitals:   03/30/19 0000 03/30/19 0400 03/30/19 0754 03/30/19 0826  BP: 118/64 (!) 143/70 (!) 162/79 Marland Kitchen)  159/86  Pulse: 60 (!) 59 65 66  Resp: 19 19 12 13   Temp:  98.5 F (36.9 C) 98 F (36.7 C) 98.2 F (36.8 C)  TempSrc:  Oral Oral Oral  SpO2: 91% 95% 94%   Weight:      Height:         GEN- The patient is well appearing, alert and oriented x 3 today.   HEENT:  normocephalic, atraumatic; sclera clear, conjunctiva pink; hearing intact; oropharynx clear; neck supple, no JVP Lungs- CTA b/l, normal work of breathing.  No wheezes, rales, rhonchi Heart- RRR, no murmurs, rubs or gallops, PMI not laterally displaced GI- soft, non-tender, non-distended Extremities- no clubbing, cyanosis, or edema MS- no significant deformity or atrophy Skin- warm and dry, no rash or lesion, left chest site is stable without hematoma/ecchymosis Psych- euthymic mood, full affect Neuro- no gross deficits   Labs:   Lab Results  Component Value Date   WBC 8.1 03/26/2019   HGB 14.3 03/26/2019   HCT 42.0 03/26/2019   MCV 88.5 03/26/2019   PLT 234 03/26/2019    Recent Labs  Lab 03/26/19 2020  03/28/19 0303  NA 138   < > 140  K 3.2*   < > 3.8  CL 98   < > 104  CO2 27   < > 26  BUN 16   < > 16  CREATININE 1.26*   < > 0.77  CALCIUM 9.5   < > 9.1  PROT 7.7  --   --   BILITOT 0.6  --   --   ALKPHOS 74  --   --   ALT 22  --   --   AST 23  --   --   GLUCOSE 118*   < > 106*   < > = values in this interval not displayed.    Discharge Medications:  Allergies as of 03/30/2019   No Known Allergies     Medication List    STOP taking these medications   valsartan 80 MG tablet Commonly known as: DIOVAN     TAKE these medications   acetaminophen 325 MG tablet Commonly known as: TYLENOL Take 650 mg by mouth every 6 (six) hours as needed for mild pain or headache.   carvedilol 12.5 MG tablet Commonly known as: COREG Take 1 tablet (12.5 mg total) by mouth 2 (two) times daily with a meal. What changed:   medication strength  how much to take  when to take this   chlorthalidone 25 MG tablet Commonly known as: HYGROTON Take 1 tablet (25 mg total) by mouth daily.   ibuprofen 200 MG tablet Commonly known as: ADVIL Take 200 mg by mouth every 6 (six) hours as needed for fever or headache.       Disposition:  Home Discharge Instructions    Diet - low  sodium heart healthy   Complete by: As directed    Increase activity slowly   Complete by: As directed      Follow-up Information    [facility] Follow up.   Specialty: Cardiology Why: 04/05/2019 @ 1:30PM, pharmacy/BP clinic follow up 04/13/2019 @ 12:00PM (noon), wound check visit Contact information: [address] [place]       Evans Lance, MD Follow up.   Specialty: Cardiology Why: 07/06/2019 @ 2:45PM Contact information: [address] [place] [address] [phone]  Duration of Discharge Encounter: Greater than 30 minutes including physician time.  Venetia Night, PA-C 03/30/2019 10:42 AM  EP Attending  Patient seen and examined. Agree with above. The patient is s/p insertion of a DDD PM. Her CXR demonstrates stable lead position. PPM interrogation demonstrates normal DDD PM function. She will be discharged home with usual followup.   Mikle Bosworth.D.

## 2019-03-29 NOTE — Telephone Encounter (Signed)
Received faxed report from [facility] on this pt, regarding this critical event recording, as documented in this encounter.  Pt is currently admitted to the hospital and will be receiving a PPM today by Dr. Lovena Le.  Will forward this message to the pts Primary Cardiologist Dr. Radford Pax as an Juluis Rainier.  Also had this signed by the DOD Dr. Burt Knack, and will be scanned into the pts chart.

## 2019-03-29 NOTE — Progress Notes (Signed)
Paged on call Cardiology DR. Rose regarding pt's BP 175/77,172/73. Waiting on call back.

## 2019-03-29 NOTE — Progress Notes (Addendum)
Progress Note  Patient Name: Anita [name] Date of Encounter: 03/29/2019  Primary Cardiologist: Dr. Radford Pax  Subjective   No chest pain or sob. C/o a hot/buring type sensation to b/l posterior upper arms, back, (and even b/l legs at times), this pre-dates her syncope, she reports many weeks.  Her arms/back (locates from below shoulder blades to top of shoulders, throughout her back) are all day every day, no change with exertion or position. NO CP, palpitations or SOB  Inpatient Medications    Scheduled Meds: . chlorhexidine  60 mL Topical Once  . chlorhexidine  60 mL Topical Once  . gentamicin irrigation  80 mg Irrigation To SSTC  . irbesartan  75 mg Oral Daily  . sodium chloride flush  3 mL Intravenous Q12H   Continuous Infusions: . sodium chloride    . sodium chloride    .  ceFAZolin (ANCEF) IV     PRN Meds:    Vital Signs    Vitals:   03/29/19 0056 03/29/19 0200 03/29/19 0645 03/29/19 0749  BP: (!) 172/73 (!) 143/75 (!) 152/69 (!) 159/63  Pulse: 67 65 (!) 59 68  Resp: 16 13 14 10   Temp: 98.3 F (36.8 C)  97.7 F (36.5 C) 98.6 F (37 C)  TempSrc: Oral  Oral Oral  SpO2: 97% 97% 98% 98%  Weight:      Height:        Intake/Output Summary (Last 24 hours) at 03/29/2019 0911 Last data filed at 03/28/2019 1300 Gross per 24 hour  Intake 200 ml  Output -  Net 200 ml   Filed Weights   03/28/19 0257  Weight: 108.5 kg    Telemetry    SR, 70's - Personally Reviewed  ECG    No new EKGs - Personally Reviewed  Physical Exam   GEN: No acute distress.   Neck: No JVD Cardiac: RRR, no murmurs, rubs, or gallops.  Respiratory: CTA b/l GI: Soft, nontender, obese, non-distended  MS: No edema; No deformity. Neuro:  Nonfocal  Psych: Normal affect   Labs    Chemistry Recent Labs  Lab 03/26/19 2020 03/26/19 2035 03/27/19 2016 03/28/19 0303  NA 138 136 137 140  K 3.2* 3.0* 3.5 3.8  CL 98 98 101 104  CO2 27  --  25 26  GLUCOSE 118* 113* 133* 106*   BUN 16 19 16 16   CREATININE 1.26* 1.30* 0.75 0.77  CALCIUM 9.5  --  9.2 9.1  PROT 7.7  --   --   --   ALBUMIN 4.1  --   --   --   AST 23  --   --   --   ALT 22  --   --   --   ALKPHOS 74  --   --   --   BILITOT 0.6  --   --   --   GFRNONAA 45*  --  >60 >60  GFRAA 52*  --  >60 >60  ANIONGAP 13  --  11 10     Hematology Recent Labs  Lab 03/26/19 2020 03/26/19 2035  WBC 8.1  --   RBC 4.89  --   HGB 14.7 14.3  HCT 43.3 42.0  MCV 88.5  --   MCH 30.1  --   MCHC 33.9  --   RDW 12.4  --   PLT 234  --     Cardiac EnzymesNo results for input(s): TROPONINI in the last 168 hours. No results for input(s):  TROPIPOC in the last 168 hours.   BNPNo results for input(s): BNP, PROBNP in the last 168 hours.   DDimer No results for input(s): DDIMER in the last 168 hours.   Radiology    Mr Angio Head Wo Contrast Result Date: 03/27/2019 CLINICAL DATA:  Syncope with stroke suspected EXAM: MR HEAD WITHOUT CONTRAST MR CIRCLE OF WILLIS WITHOUT CONTRAST MRA OF THE NECK WITHOUT AND WITH CONTRAST TECHNIQUE: Multiplanar, multiecho pulse sequences of the brain, circle of willis and surrounding structures were obtained without intravenous contrast. Angiographic images of the neck were obtained using MRA technique without and with intravenous contrast. CONTRAST:  10 cc Gadavist intravenous COMPARISON:  Head CT from yesterday FINDINGS: MR HEAD FINDINGS Brain: No acute infarction, hemorrhage, hydrocephalus, extra-axial collection or mass lesion. Patchy FLAIR hyperintensity in the cerebral white matter and pons most likely related to chronic small vessel ischemia. Mild FLAIR hyperintensity at the anterior left putamen and anterior limb internal capsule correlates with head CT and is chronic. Normal brain volume Vascular: Normal dural venous sinus flow voids. Arterial findings below Skull and upper cervical spine: Negative for marrow lesion Sinuses/Orbits: Negative MR CIRCLE OF WILLIS FINDINGS Carotid, vertebral,  and basilar arteries are smooth and widely patent. No major branch occlusion. No beading or aneurysm. Hypoplastic right P1 segment. Suspect mild atheromatous narrowing of medium size vessels bilaterally. MRA NECK FINDINGS Normal arch. Three vessel branching. No flow limiting stenosis, ulceration, beading, or aneurysm. 40% narrowing at the right vertebral origin. IMPRESSION: Brain MRI: 1. No acute finding. 2. Mild presumed chronic small vessel ischemia. Intracranial MRA: 1. No emergent finding or flow limiting stenosis. 2. Mild atheromatous changes of medium size vessels. Neck MRA: 40% narrowing at the right vertebral origin.  Otherwise negative. Electronically Signed   By: Monte Fantasia M.D.   On: 03/27/2019 10:09       Cardiac Studies   03/24/2019: TTE IMPRESSIONS 1. The left ventricle has normal systolic function with an ejection fraction of 60-65%. The cavity size was normal. There is mildly increased left ventricular wall thickness. Left ventricular diastolic Doppler parameters are consistent with  pseudonormalization. Elevated mean left atrial pressure.  2. The right ventricle has normal systolic function. The cavity was normal. There is no increase in right ventricular wall thickness. Right ventricular systolic pressure is normal with an estimated pressure of 19.6 mmHg.  3. Left atrial size was mildly dilated.  4. Right atrial size was mildly dilated.  5. There is mild mitral annular calcification present.  6. The tricuspid valve is grossly normal.  7. Mild stenosis of the aortic valve.  8. The aorta is normal in size and structure.  FINDINGS  Left Ventricle: The left ventricle has normal systolic function, with an ejection fraction of 60-65%. The cavity size was normal. There is mildly increased left ventricular wall thickness. Left ventricular diastolic Doppler parameters are consistent  with pseudonormalization. Elevated mean left atrial pressure  Right Ventricle: The right  ventricle has normal systolic function. The cavity was normal. There is no increase in right ventricular wall thickness. Right ventricular systolic pressure is normal with an estimated pressure of 19.6 mmHg.  Left Atrium: Left atrial size was mildly dilated.  Right Atrium: Right atrial size was mildly dilated. Right atrial pressure is estimated at 3 mmHg.  Interatrial Septum: No atrial level shunt detected by color flow Doppler.  Pericardium: There is no evidence of pericardial effusion.  Mitral Valve: The mitral valve is normal in structure. There is mild mitral annular calcification  present. Mitral valve regurgitation is trivial by color flow Doppler.  Tricuspid Valve: The tricuspid valve is grossly normal. Tricuspid valve regurgitation is trivial by color flow Doppler.  Aortic Valve: The aortic valve is normal in structure. Aortic valve regurgitation was not visualized by color flow Doppler. There is Mild stenosis of the aortic valve, with a calculated valve area of 1.57 cm.  Pulmonic Valve: The pulmonic valve was not well visualized. Pulmonic valve regurgitation is not visualized by color flow Doppler.  Aorta: The aorta is normal in size and structure.  Venous: The inferior vena cava is normal in size with greater than 50% respiratory variability.  Patient Profile     63 y.o. female w/PMHx of HTN, obesity, admitted with syncope and found to have pauses of up to 18 seconds in setting of RBBB  Assessment & Plan   Today's visit is done with the aid of Francee Piccolo, # [id number]   1.Stokes Adams syncope - sinus node dysfunction    Planned for PPM today with Dr. Lovena Le    She has no follow up questions, remains agreeable to proceed  2. HTN      her blood pressure has been a little high.     Will start tx post pacing  3. ? Burning sensation as noted above      Unknown etiology, does not strike me as cardiac or vascular      Predates her syncope      ?  Musculoskeletal, she mentions as well anxiety 4. Acute kidney injury - her creatinine had gone up to 1.3 but has now gone back down.  We will follow. Avoid nephrotoxic agents.         For questions or updates, please contact [facility] Please consult [email or web address] for contact info under Cardiology/STEMI.      Signed, Baldwin Jamaica, PA-C  03/29/2019, 9:11 AM  Patient ID: Lynett Grimes, female   DOB: 01/17/1956, 63 y.o.   MRN: [id number]   EP Attending  Patient seen and examined. Agree with above. The patient is doing well and will proceed with PPM insertion today. Hopefully discharge home tomorrow.   Mikle Bosworth.D.

## 2019-03-30 ENCOUNTER — Inpatient Hospital Stay (HOSPITAL_COMMUNITY): Payer: Self-pay

## 2019-03-30 ENCOUNTER — Encounter (HOSPITAL_COMMUNITY): Payer: Self-pay | Admitting: Internal Medicine

## 2019-03-30 LAB — TSH: TSH: 2.927 u[IU]/mL (ref 0.350–4.500)

## 2019-03-30 IMAGING — DX CHEST - 2 VIEW
2 series · 2 of 2 positions shown · non-contrast
Comparison: [DATE]

CLINICAL DATA: Pacemaker

EXAM:
CHEST - 2 VIEW

[chest lat]
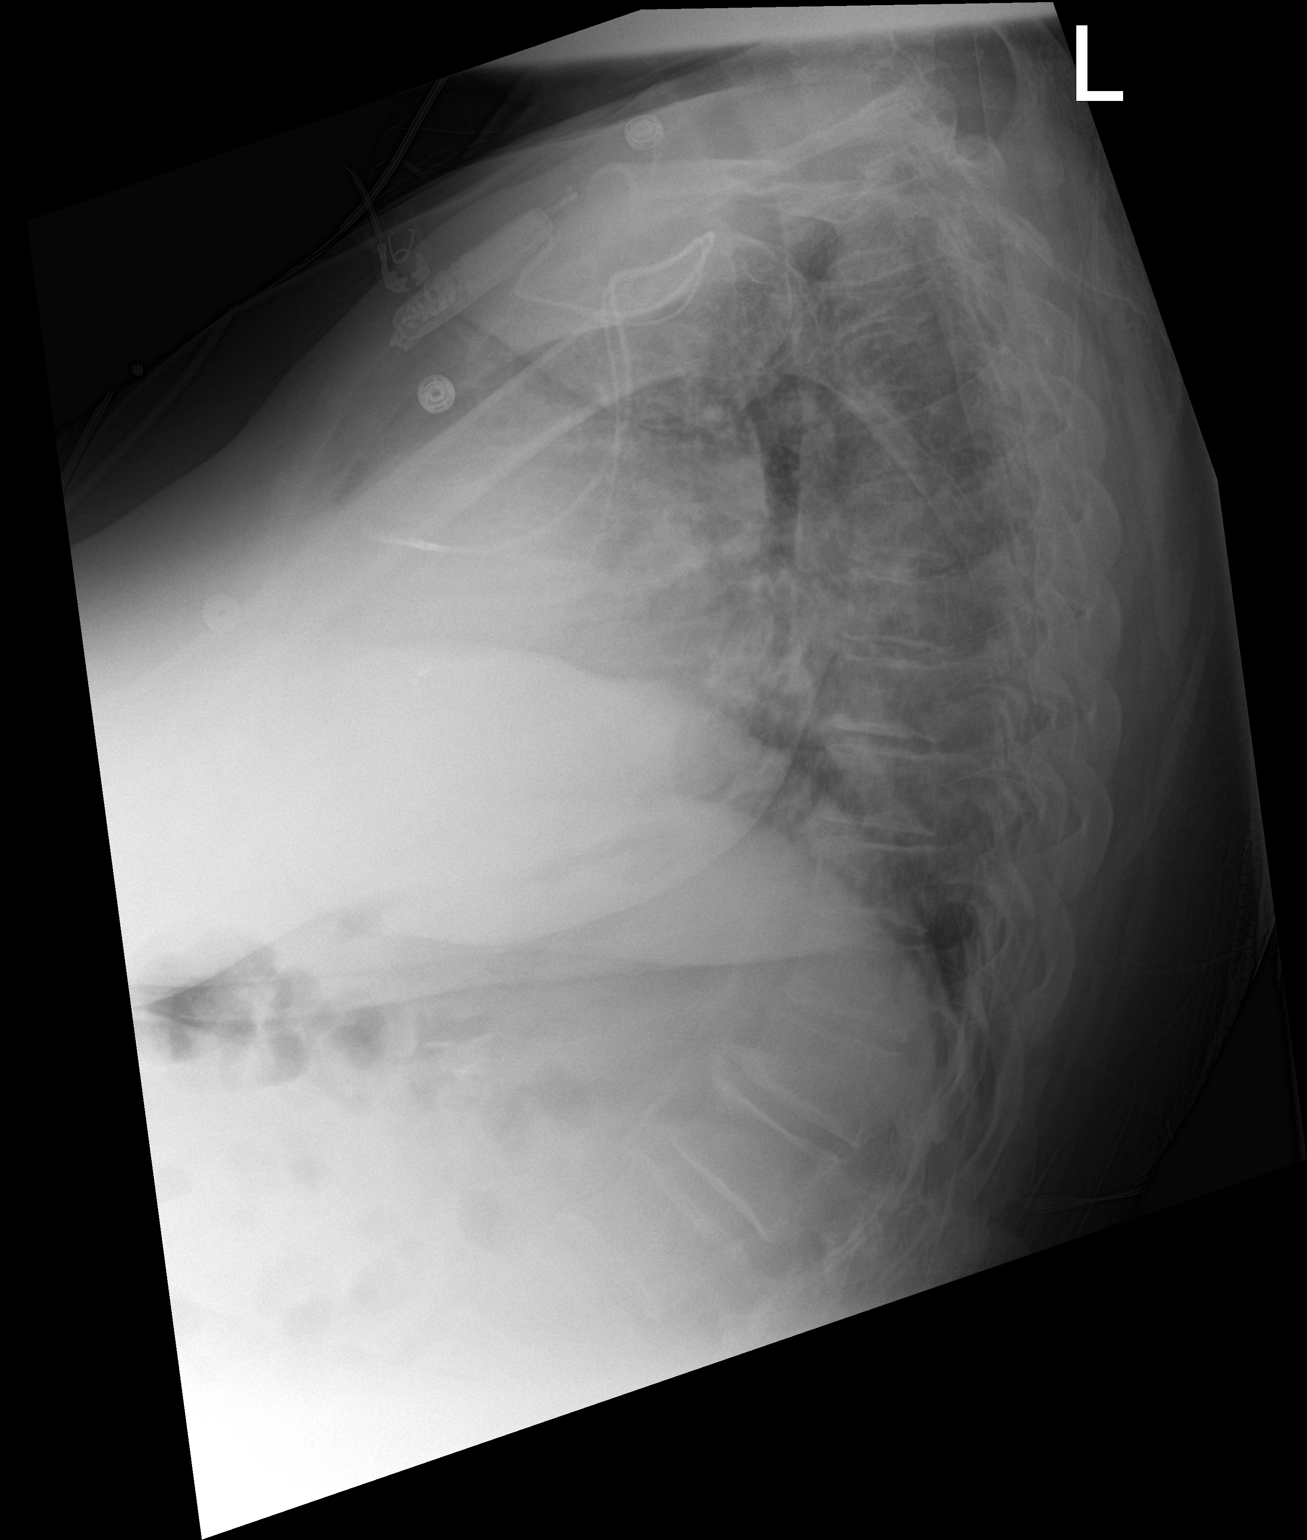

[chest ap]
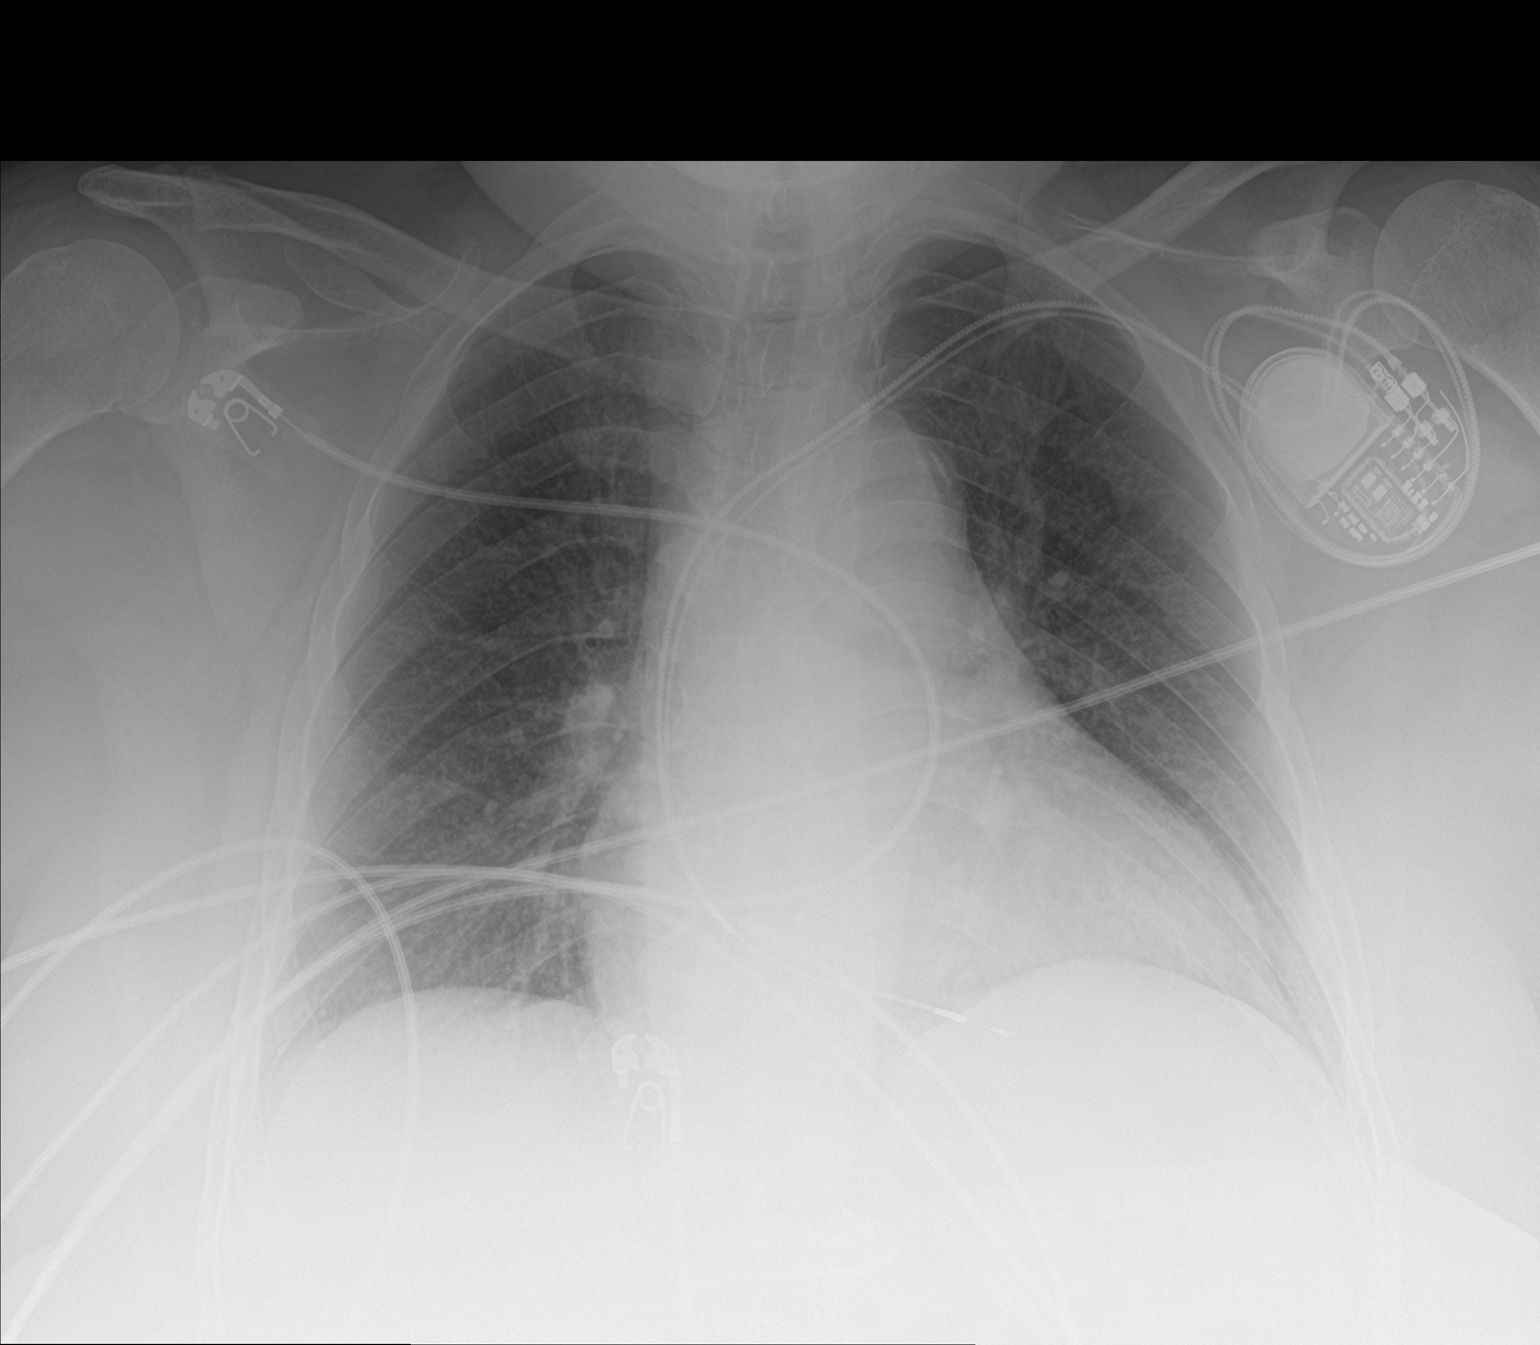

[2 of 2 positions shown; findings below may reference images not displayed]

FINDINGS: Left pacer in place with leads in the right atrium and right
ventricle. No pneumothorax. Cardiomegaly. Lungs clear. No effusions
or edema. No acute bony abnormality.
IMPRESSION: Left pacer placement without pneumothorax.  No active disease.

## 2019-03-30 MED ORDER — CARVEDILOL 6.25 MG PO TABS: 6.2500 mg | ORAL_TABLET | Freq: Two times a day (BID) | ORAL | Status: DC

## 2019-03-30 MED ORDER — CARVEDILOL 12.5 MG PO TABS: 12.5000 mg | ORAL_TABLET | Freq: Two times a day (BID) | ORAL | 6 refills | Status: DC

## 2019-03-30 MED ORDER — CARVEDILOL 12.5 MG PO TABS
12.5000 mg | ORAL_TABLET | Freq: Two times a day (BID) | ORAL | Status: DC
  Administered 2019-03-30: 11:00:00 12.5 mg via ORAL
  Filled 2019-03-30: qty 1

## 2019-03-30 MED FILL — Cefazolin Sodium-Dextrose IV Solution 2 GM/100ML-4%: INTRAVENOUS | Qty: 100 | Status: AC

## 2019-03-30 MED FILL — Gentamicin Sulfate Inj 40 MG/ML: INTRAMUSCULAR | Qty: 80 | Status: AC

## 2019-03-30 MED FILL — Lidocaine HCl Local Inj 1%: INTRAMUSCULAR | Qty: 60 | Status: AC

## 2019-03-30 NOTE — Progress Notes (Signed)
Pt ambulated 134ft in hallway with staff. Pt tolerated well, no complaints of dizziness. Pt back to bed resting comfortably. VSS. Will continue to monitor. Amanda Cockayne, RN

## 2019-03-30 NOTE — Discharge Instructions (Signed)
° ° °  Supplemental Discharge Instructions for  Pacemaker/Defibrillator Patients  Activity No heavy lifting or vigorous activity with your left/right arm for 6 to 8 weeks.  Do not raise your left/right arm above your head for one week.  Gradually raise your affected arm as drawn below.              04/02/2019                04/03/2019                04/04/2019               04/05/2019 __  NO DRIVING for  1 week   ; you may begin driving on  1/79/1505  .  WOUND CARE - Keep the wound area clean and dry.  Do not get this area wet for one week. No showers for one week; you may shower on 04/05/2019  . - The tape/steri-strips on your wound will fall off; do not pull them off.  No bandage is needed on the site.  DO  NOT apply any creams, oils, or ointments to the wound area. - If you notice any drainage or discharge from the wound, any swelling or bruising at the site, or you develop a fever > 101? F after you are discharged home, call the office at once.  Special Instructions - You are still able to use cellular telephones; use the ear opposite the side where you have your pacemaker/defibrillator.  Avoid carrying your cellular phone near your device. - When traveling through airports, show security personnel your identification card to avoid being screened in the metal detectors.  Ask the security personnel to use the hand wand. - Avoid arc welding equipment, MRI testing (magnetic resonance imaging), TENS units (transcutaneous nerve stimulators).  Call the office for questions about other devices. - Avoid electrical appliances that are in poor condition or are not properly grounded. - Microwave ovens are safe to be near or to operate.

## 2019-03-30 NOTE — Progress Notes (Signed)
Patient's family member relayed to this RN that the patient was experiencing tingling and burning similar in nature before her heart had started being symptomatic. Patient also stated that the burning and tingling has gone up to her chin. Patient states that she feels lightheaded and dizzy when this happens. Neuro checks done and within normal limits. This RN went to xray with patient. Will continue to monitor

## 2019-03-30 NOTE — Progress Notes (Signed)
Discharge AVS meds take and those due reviewed with pt with video interpreter. Follow up appointments and when to call MD reviewed. All questions and concerns addressed. No further questions at this time. D/c IV and TELE, CCMD notified. D/C home per orders. Brought out via wheelchair with family and staff.

## 2019-03-31 LAB — HIV ANTIBODY (ROUTINE TESTING W REFLEX): HIV Screen 4th Generation wRfx: NONREACTIVE

## 2019-04-03 ENCOUNTER — Telehealth: Payer: Self-pay | Admitting: Internal Medicine

## 2019-04-03 ENCOUNTER — Other Ambulatory Visit: Payer: Self-pay

## 2019-04-03 ENCOUNTER — Emergency Department (HOSPITAL_COMMUNITY): Payer: Self-pay

## 2019-04-03 ENCOUNTER — Emergency Department (HOSPITAL_COMMUNITY)
Admission: EM | Admit: 2019-04-03 | Discharge: 2019-04-03 | Disposition: A | Discharge status: Home / Self Care | Payer: Self-pay | Attending: Emergency Medicine | Admitting: Emergency Medicine

## 2019-04-03 ENCOUNTER — Encounter (HOSPITAL_COMMUNITY): Payer: Self-pay | Admitting: *Deleted

## 2019-04-03 ENCOUNTER — Telehealth: Payer: Self-pay | Admitting: Cardiology

## 2019-04-03 DIAGNOSIS — Z79899 Other long term (current) drug therapy: Secondary | ICD-10-CM | POA: Insufficient documentation

## 2019-04-03 DIAGNOSIS — T829XXA Unspecified complication of cardiac and vascular prosthetic device, implant and graft, initial encounter: Secondary | ICD-10-CM

## 2019-04-03 DIAGNOSIS — Y712 Prosthetic and other implants, materials and accessory cardiovascular devices associated with adverse incidents: Secondary | ICD-10-CM | POA: Insufficient documentation

## 2019-04-03 DIAGNOSIS — I10 Essential (primary) hypertension: Secondary | ICD-10-CM | POA: Insufficient documentation

## 2019-04-03 DIAGNOSIS — T82118A Breakdown (mechanical) of other cardiac electronic device, initial encounter: Secondary | ICD-10-CM | POA: Insufficient documentation

## 2019-04-03 NOTE — ED Notes (Signed)
Patient verbalizes understanding of discharge instructions. Opportunity for questioning and answers were provided. Armband removed by staff, pt discharged from ED.  

## 2019-04-03 NOTE — ED Notes (Addendum)
This RN received report from January a Music therapist. Stated that pacemaker appears to be functioning normally.

## 2019-04-03 NOTE — Discharge Instructions (Addendum)
It was our pleasure to provide your ER care today - we hope that you feel better.  Follow up with your cardiologist as planned with them.   Return to ER if worse, new symptoms, feeling weak/faint, chest pain, trouble breathing, fevers, or other concern.

## 2019-04-03 NOTE — ED Provider Notes (Signed)
[facility] EMERGENCY DEPARTMENT Provider Note   CSN: [id number] Arrival date & time: 04/03/19  0845     History   Chief Complaint Chief Complaint  Patient presents with  . Pacemaker Problem    HPI Anita [name] is a 63 y.o. female.     Patient with hx recent pacemaker placement, indicated was called today ?by pacemaker company, requesting xray be done to verify lead placement. Patient indicates feels fine. No faintness or dizziness. No syncope. Denies chest [name] or discomfort. No sob. No weakness. No fever or chills. Denies increased [name] or swelling and insertion site.   The history is provided by the patient and a relative. A language interpreter was used.    Past Medical History:  Diagnosis Date  . ABDOMINAL [name] 05/05/2009   Qualifier: Diagnosis of  By: Jorene Minors, Scott    . Adjustment disorder with depressed mood 08/30/2009   Qualifier: Diagnosis of  By: Jorene Minors, Scott    . ALLERGIC RHINITIS 12/04/2009   Qualifier: Diagnosis of  By: Jorene Minors, Scott    . Allergy   . Aortic stenosis    mild by echo 03/2019  . CHOLELITHIASIS 05/30/2009   Qualifier: Diagnosis of  By: Jorene Minors, Scott    . COLONIC POLYPS, HX OF 05/09/2010   Qualifier: Diagnosis of  By: Jorene Minors, Scott    . Essential hypertension, benign 05/05/2009   Qualifier: Diagnosis of  By: Jorene Minors, Scott    . GERD 06/12/2009   Qualifier: Diagnosis of  By: Jorene Minors, Scott    . Hypertension   . KNEE [name] 06/12/2009   Qualifier: Diagnosis of  By: Jorene Minors, Scott    . LIVER MASS 05/30/2009   Qualifier: Diagnosis of  By: Jorene Minors, Scott    . OBESITY 10/24/2010   Qualifier: Diagnosis of  By: Keenan Bachelor RN, Rip Harbour    . TINNITUS, CHRONIC, RIGHT 09/07/2010   Qualifier: Diagnosis of  By: Amil Amen MD, Benjamine Mola    . TOOTH LOSS 09/07/2010   Qualifier: Diagnosis of  By: Amil Amen MD, Benjamine Mola    . 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  VARICOSE VEINS, LOWER EXTREMITIES 05/05/2009   Qualifier: Diagnosis of  By: Jorene Minors, Scott      Patient Active Problem List   Diagnosis Date Noted  . Syncope 03/27/2019  . Aortic stenosis   . BMI 45.0-49.9, adult ([place]) 10/19/2014  . Venous stasis dermatitis of left lower extremity 10/19/2014  . OBESITY 10/24/2010  . TINNITUS, CHRONIC, RIGHT 09/07/2010  . TOOTH LOSS 09/07/2010  . COLONIC POLYPS, HX OF 05/09/2010  . ALLERGIC RHINITIS 12/04/2009  . 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 ADJUSTMENT DISORDER WITH DEPRESSED MOOD 08/30/2009  . GERD 06/12/2009  . KNEE [name] 06/12/2009  . LIVER MASS 05/30/2009  . CHOLELITHIASIS 05/30/2009  . ESSENTIAL HYPERTENSION, BENIGN 05/05/2009  . 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 ABDOMINAL [name] 05/05/2009    Past Surgical History:  Procedure Laterality Date  . ABDOMINAL HYSTERECTOMY    . CESAREAN SECTION    . PACEMAKER IMPLANT N/A 03/29/2019   Procedure: PACEMAKER IMPLANT;  Surgeon: Evans Lance, MD;  Location: [facility];  Service: Cardiovascular;  Laterality: N/A;     OB History   No obstetric history on file.      Home Medications    Prior to Admission medications   Medication Sig Start Date End Date Taking? Authorizing Provider  acetaminophen (  TYLENOL) 325 MG tablet Take 650 mg by mouth every 6 (six) hours as needed for mild [name] or headache.    [provider]  carvedilol (COREG) 12.5 MG tablet Take 1 tablet (12.5 mg total) by mouth 2 (two) times daily with a meal. 03/30/19   Baldwin Jamaica, PA-C  chlorthalidone (HYGROTON) 25 MG tablet Take 1 tablet (25 mg total) by mouth daily. 03/11/19 06/09/19  Sueanne Margarita, MD  ibuprofen (ADVIL) 200 MG tablet Take 200 mg by mouth every 6 (six) hours as needed for fever or headache.    [provider]    Family History Family History  Problem Relation Age of Onset  . Hypertension Sister   . Hyperlipidemia Sister   . Hypertension Brother   .  Hyperlipidemia Brother   . Migraines Daughter   . Hypertension Sister   . Hyperlipidemia Sister   . Cancer Sister   . Hypertension Sister   . Hyperlipidemia Sister   . Hypertension Brother   . Hyperlipidemia Brother   . Hypertension Brother   . Hyperlipidemia Brother   . Hypertension Brother   . Hyperlipidemia Brother   . Hypertension Brother   . Hyperlipidemia Brother   . Diabetes Paternal Grandmother   . Hypertension Paternal Grandmother     Social History Social History   Tobacco Use  . Smoking status: Never Smoker  . Smokeless tobacco: Never Used  Substance Use Topics  . Alcohol use: No    Alcohol/week: 0.0 standard drinks  . Drug use: No     Allergies   Patient has no known allergies.   Review of Systems Review of Systems  Constitutional: Negative for fever.  HENT: Negative for sore throat.   Eyes: Negative for redness.  Respiratory: Negative for shortness of breath.   Cardiovascular: Negative for chest [name].  Gastrointestinal: Negative for abdominal [name].  Genitourinary: Negative for flank [name].  Musculoskeletal: Negative for back [name].  Skin: Negative for rash.  Neurological: Negative for syncope.  Hematological: Does not bruise/bleed easily.  Psychiatric/Behavioral: Negative for confusion.     Physical Exam Updated Vital Signs BP (!) 174/87 (BP Location: Right Arm)   Pulse 60   Temp 98.3 F (36.8 C) (Oral)   Resp 16   Ht 1.626 m (5\' 4" )   Wt 104.3 kg   SpO2 100%   BMI 39.48 kg/m   Physical Exam Vitals signs and nursing note reviewed.  Constitutional:      Appearance: Normal appearance. She is well-developed.  HENT:     Head: Atraumatic.     Nose: Nose normal.     Mouth/Throat:     Mouth: Mucous membranes are moist.  Eyes:     General: No scleral icterus.    Conjunctiva/sclera: Conjunctivae normal.  Neck:     Musculoskeletal: Normal range of motion and neck supple. No neck rigidity or muscular tenderness.     Trachea: No tracheal  deviation.  Cardiovascular:     Rate and Rhythm: Normal rate and regular rhythm.     Pulses: Normal pulses.     Heart sounds: Normal heart sounds. No murmur. No friction rub. No gallop.   Pulmonary:     Effort: Pulmonary effort is normal. No respiratory distress.     Breath sounds: Normal breath sounds.     Comments: Pacemaker site left chest without sign of infection.  Abdominal:     General: There is no distension.     Palpations: Abdomen is soft.  Tenderness: There is no abdominal tenderness.  Genitourinary:    Comments: No cva tenderness.  Musculoskeletal:        General: No swelling.  Skin:    General: Skin is warm and dry.     Findings: No rash.  Neurological:     Mental Status: She is alert.     Comments: Alert, speech normal.   Psychiatric:        Mood and Affect: Mood normal.      ED Treatments / Results  Labs (all labs ordered are listed, but only abnormal results are displayed) Labs Reviewed - No data to display  EKG None  Radiology Dg Chest 2 View  Result Date: 04/03/2019 CLINICAL DATA:  Pacemaker evaluation.  Hx HTN. EXAM: CHEST - 2 VIEW COMPARISON:  Is 03/30/2019 FINDINGS: Patient has a LEFT-sided transvenous pacemaker with leads to the RIGHT atrium and RIGHT ventricle. Leads appear unchanged. The heart is mildly enlarged and stable in configuration. The lungs are free of focal consolidations and pleural effusions. No pulmonary edema. Mild anterior wedge deformity of T12. IMPRESSION: 1. Stable cardiomegaly. 2. Pacemaker appears unchanged. Electronically Signed   By: Nolon Nations M.D.   On: 04/03/2019 11:08    Procedures Procedures (including critical care time)  Medications Ordered in ED Medications - No data to display   Initial Impression / Assessment and Plan / ED Course  I have reviewed the triage vital signs and the nursing notes.  Pertinent labs & imaging results that were available during my care of the patient were reviewed by me and  considered in my medical decision making (see chart for details).  Patients family member with text/message requesting xray be done. Will interrogate pacemaker and get cxr to check lead placement.   Reviewed nursing notes and prior charts for additional history.   CXR reviewed by me - no infilt, pacemaker noted unchanged from prior.   RN has received report from pacemaker rep - see below, reportedly functioning normally. Pt remains asymptomatic.   Trudee Kuster, RN  Registered Nurse  Specialty:  Emergency Medicine  ED Notes  Addendum  Date of Service:  04/03/2019 10:21 AM             Show:Clear all [x] Manual[] Template[] Copied  Added by: [x] Trudee Kuster, RN  [] Hover for details This RN received report from January a Music therapist. Stated that pacemaker appears to be functioning normally.        Pt currently appears stable for d/c.   Rec cardiology f/u.  Return precautions provided.     Final Clinical Impressions(s) / ED Diagnoses   Final diagnoses:  None    ED Discharge Orders    None       Lajean Saver, MD 04/03/19 1223

## 2019-04-03 NOTE — Telephone Encounter (Signed)
    Cross-coverage from overnight fellow. Received call from [employer] regarding high RV lead threshold alerts s/p PPM placement 03/29/2019. Spoke with Dr. Curt Bears who recommends that she be seen in the ED to check lead placement. Called and spoke to patients daughter as the patient does not speak [place]. She agrees to bring the pt to [facility] ED for further evaluation.   Kathyrn Drown NP-C [place] Pager: [phone]

## 2019-04-03 NOTE — Telephone Encounter (Signed)
Called by [employer] that patient for high RV lead threshold alert. PPM implanted on 8/17 and on 8/18, capture threshold 0.25 V @ 0.4 ms. Output set to 3.5 V @ 0.4 ms. [employer] rep unable to tell exact capture threshold, but notes device has been automatically reprogrammed to 5V @ 0.4 ms due to high thresholds. Will alert Dr. Lovena Le.  Lauren K. Marletta Lor, MD

## 2019-04-03 NOTE — ED Triage Notes (Signed)
Daughter states patient had pacemaker placed last Monday and was called today and told the received signal form her pacemaker that show it could not be connected correctly and the wanted her to have xrays to check palcement, states the patient isn't having any problems.

## 2019-04-04 NOTE — Progress Notes (Signed)
Patient ID: Anita [name]                 DOB: 1956-05-22                      MRN: [id number]     HPI: Anita [name] is a 63 y.o. female referred by Dr. Radford Pax to HTN clinic. PMH is significant for HTN. Patient was last seen by Dr. Radford Pax on 7/30 for pre-syncope and uncontrolled HTN. Patient had been taking metoprolol and hydrochlorothiazide in the past but stopped taking 6-7 years ago.  BP was 208/96 and pt was started on carvedilol 6.25 mg BID and chlorthalidone 25 mg daily at this visit. She was given a cardiac monitor to wear for 1 month to rule out arrhythmia induced pre-syncope.   Patient was last seen in pharmacist clinic on 8/10 and BP had improved to 142/78. Lisinopril 10 mg once daily was added, but was ultimately switched to valsartan 80 mg daily because she was symptomatic from lisinopril (bilateral arm pain, chills).  Since this appointment, patient was admitted to the hospital on 8/14 for an 18 second pause recorded on cardiac monitor causing a syncopal episode. Patient received a PPM and was discharged on 8/18. Discharge med list: carvedilol 12.5 BID and chlorthalidone 25 mg daily (valsartan was discontinued by MD since she was still having bilateral arm pain).  Patient presents to clinic today for HTN follow up. Patient states pacemaker wire is loose. Symptoms have improved since last visit. Denies having more pre-syncopal episodes. Still reports dizziness and says her leg edema is worsening. Patient reports still having a burning sensation in her legs and feels like "electrical explosions" going through her legs. The tingling and burning sensations she had felt in her arms after taking lisinopril and valsartan are still present. She has only taken her BP at home on one occasion. SBP on her reading was 189 but when her daughter repeated it, she was able to get SBP ~140-150 three times in a row. BP in clinic today was 132/76.  She has improved her diet by no longer eating fried  foods, has cut out tortillas but still eats some bread, and has increased water intake without adding flavor enhancers. Has not had the chance to increase exercise yet because she was told by MD to take it slow after receiving PPM. She also states she has not established a PCP yet, mostly due to hospital admission and cardiology follow up, as well as not having insurance.   Current HTN meds: carvedilol 12.5 mg BID, chlorthalidone 25 mg daily  Previously tried: lisinopril (bilateral arm pain, chills); valsartan (discontinued at hospital discharge); metoprolol succinate and hydrochlorothiazide (stopped taking ~6 years ago, discontinued by MD)  BP goal: <130/80  Family History: Father - CAD, CABG; Mother - DCM  Social History: never smoker, no alcohol use  Diet: Breakfast - oatmeal Lunch - "whatever is around" Protein - fish, chicken, red meat Vegetables - few Fruit - few Drinks: minimal intake - occasional regular soda (not diet), does not drink coffee, mostly water though she states she does not like it plain, some juices  Exercise: None  Home BP readings: SBP 140s  Wt Readings from Last 3 Encounters:  04/03/19 230 lb (104.3 kg)  03/28/19 239 lb 1.6 oz (108.5 kg)  03/24/19 247 lb (112 kg)   BP Readings from Last 3 Encounters:  04/03/19 (!) 160/70  03/30/19 (!) 158/71  03/22/19 (!) 152/88   Pulse Readings  from Last 3 Encounters:  04/03/19 65  03/30/19 71  03/22/19 (!) 53    Renal function: Estimated Creatinine Clearance: 82.5 mL/min (by C-G formula based on SCr of 0.77 mg/dL).  Past Medical History:  Diagnosis Date  . ABDOMINAL PAIN 05/05/2009   Qualifier: Diagnosis of  By: Jorene Minors, Scott    . Adjustment disorder with depressed mood 08/30/2009   Qualifier: Diagnosis of  By: Jorene Minors, Scott    . ALLERGIC RHINITIS 12/04/2009   Qualifier: Diagnosis of  By: Jorene Minors, Scott    . Allergy   . Aortic stenosis    mild by echo 03/2019  . CHOLELITHIASIS 05/30/2009    Qualifier: Diagnosis of  By: Jorene Minors, Scott    . COLONIC POLYPS, HX OF 05/09/2010   Qualifier: Diagnosis of  By: Jorene Minors, Scott    . Essential hypertension, benign 05/05/2009   Qualifier: Diagnosis of  By: Jorene Minors, Scott    . GERD 06/12/2009   Qualifier: Diagnosis of  By: Jorene Minors, Scott    . Hypertension   . KNEE PAIN 06/12/2009   Qualifier: Diagnosis of  By: Jorene Minors, Scott    . LIVER MASS 05/30/2009   Qualifier: Diagnosis of  By: Jorene Minors, Scott    . OBESITY 10/24/2010   Qualifier: Diagnosis of  By: Keenan Bachelor RN, Rip Harbour    . TINNITUS, CHRONIC, RIGHT 09/07/2010   Qualifier: Diagnosis of  By: Amil Amen MD, Benjamine Mola    . TOOTH LOSS 09/07/2010   Qualifier: Diagnosis of  By: Amil Amen MD, Benjamine Mola    . 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 VARICOSE VEINS, LOWER EXTREMITIES 05/05/2009   Qualifier: Diagnosis of  By: Versie Starks      Current Outpatient Medications on File Prior to Visit  Medication Sig Dispense Refill  . acetaminophen (TYLENOL) 325 MG tablet Take 650 mg by mouth every 6 (six) hours as needed for mild pain or headache.    . carvedilol (COREG) 12.5 MG tablet Take 1 tablet (12.5 mg total) by mouth 2 (two) times daily with a meal. 60 tablet 6  . chlorthalidone (HYGROTON) 25 MG tablet Take 1 tablet (25 mg total) by mouth daily. 90 tablet 3  . ibuprofen (ADVIL) 200 MG tablet Take 200 mg by mouth every 6 (six) hours as needed for fever or headache.     No current facility-administered medications on file prior to visit.     No Known Allergies   Assessment/Plan:  1. Hypertension - BP today was much improved, very close to goal <130/80. Continue taking carvedilol 12.5 mg BID and chlorthalidone 25 mg daily. Instructed to continue taking BP more frequently at home and to bring log back to next HTN clinic visit. Encouraged to continue improvements with diet. Instructed to follow exercise recommendations by MD  post PPM.  2. Extremity tingling/burning - Instructed patient to follow up with [facility] to establish PCP and receive further work up.  Follow up in HTN clinic in 4 weeks   Vertis Kelch, PharmD PGY2 Cardiology Pharmacy Resident [facility] [id number] [place], [place] [address] Phone: [phone]; Fax: [phone]

## 2019-04-04 NOTE — Telephone Encounter (Signed)
She will need a cxr and a device recheck. GT

## 2019-04-05 ENCOUNTER — Other Ambulatory Visit (HOSPITAL_COMMUNITY): Payer: Self-pay

## 2019-04-05 ENCOUNTER — Ambulatory Visit (INDEPENDENT_AMBULATORY_CARE_PROVIDER_SITE_OTHER): Payer: Self-pay

## 2019-04-05 ENCOUNTER — Ambulatory Visit (INDEPENDENT_AMBULATORY_CARE_PROVIDER_SITE_OTHER): Payer: Self-pay | Admitting: *Deleted

## 2019-04-05 ENCOUNTER — Other Ambulatory Visit: Payer: Self-pay

## 2019-04-05 ENCOUNTER — Telehealth: Payer: Self-pay | Admitting: Emergency Medicine

## 2019-04-05 VITALS — BP 132/76 | HR 60

## 2019-04-05 DIAGNOSIS — I442 Atrioventricular block, complete: Secondary | ICD-10-CM

## 2019-04-05 DIAGNOSIS — I10 Essential (primary) hypertension: Secondary | ICD-10-CM

## 2019-04-05 DIAGNOSIS — Z950 Presence of cardiac pacemaker: Secondary | ICD-10-CM

## 2019-04-05 LAB — CUP PACEART REMOTE DEVICE CHECK
Battery Remaining Longevity: 82 mo
Battery Voltage: 3.19 V
Brady Statistic AP VP Percent: 53.78 %
Brady Statistic AP VS Percent: 35.66 %
Brady Statistic AS VP Percent: 0.03 %
Brady Statistic AS VS Percent: 10.54 %
Brady Statistic RA Percent Paced: 89.4 %
Brady Statistic RV Percent Paced: 53.81 %
Date Time Interrogation Session: 20200822050121
Implantable Lead Implant Date: 20200817
Implantable Lead Implant Date: 20200817
Implantable Lead Location: 753859
Implantable Lead Location: 753860
Implantable Lead Model: 3830
Implantable Lead Model: 5076
Implantable Pulse Generator Implant Date: 20200817
Lead Channel Impedance Value: 323 Ohm
Lead Channel Impedance Value: 456 Ohm
Lead Channel Impedance Value: 456 Ohm
Lead Channel Impedance Value: 646 Ohm
Lead Channel Pacing Threshold Amplitude: 0.875 V
Lead Channel Pacing Threshold Amplitude: 2.5 V
Lead Channel Pacing Threshold Pulse Width: 0.4 ms
Lead Channel Pacing Threshold Pulse Width: 0.4 ms
Lead Channel Sensing Intrinsic Amplitude: 0.875 mV
Lead Channel Sensing Intrinsic Amplitude: 0.875 mV
Lead Channel Sensing Intrinsic Amplitude: 2.625 mV
Lead Channel Sensing Intrinsic Amplitude: 2.625 mV
Lead Channel Setting Pacing Amplitude: 3.5 V
Lead Channel Setting Pacing Amplitude: 5 V
Lead Channel Setting Pacing Pulse Width: 1 ms
Lead Channel Setting Sensing Sensitivity: 1.2 mV

## 2019-04-05 NOTE — Telephone Encounter (Signed)
Spoke with patient's daughter concerning PPM alert to request patient come in to DC for ppm assessment due to alert. Pt to come in at 1230. Covid -19 screening negative for exposure and negative for sx.

## 2019-04-05 NOTE — Patient Instructions (Addendum)
  Gracias por recibirnos hoy!    Su presin arterial y frecuencia cardaca mejoraron mucho hoy.  Contine tomando carvedilol y clortalidona segn las instrucciones.  Hoy no hay cambios de medicacin.   Llame a la [employer] [facility] y Therapist, sports de la comunidad para Health visitor un mdico de atencin primaria.   Su prxima cita con nosotros ser el 21/9/20 a las 2:30 pm   Llame al [phone] si tiene algn problema o pregunta desde ahora hasta su prxima cita.

## 2019-04-05 NOTE — Patient Instructions (Signed)
Implantable Device Instructions  You are scheduled for:  Lead revision on April 09, 2019 with Dr. Cristopher Peru  1. Pre procedure testing-  COVID TEST--AS soon as you leave [employer] office- You will go to [facility] ([place]) for your Covid testing.  This is a drive thru test site. There will be multiple testing areas.  Be sure to share with the first checkpoint that you are there for pre-procedure/surgery testing. This will put you into the right (yellow) lane that leads to the PAT testing team Stay in your car and the nurse team will come to your car to test you. After you are tested please go home and self quarantine until the day of your procedure.   2.  On the day of your procedure April 09, 2019 you will go to [facility] [phone]) at 11:00 am. Dennis Bast will go to the main entrance A [employer]) and enter where the [employer] are. You will check in at ADMITTING.  You may have one support person come in to the hospital with you.  They will be asked to wait in the waiting room.  3. Do not eat or drink after midnight prior to your procedure.  4. You may take your normal morning medications with a sip of water  5. Plan for an overnight stay but you may be discharged after your procedure.  If you use your phone frequently bring your phone charger.  When you are discharged you will need someone to drive you home.  6. Your follow up appointments will be scheduled for you  * If you have ANY questions after you get home, please call the office [phone] and ask for Bing Neighbors or send a [employer].      [place], [id number]                                 2

## 2019-04-06 ENCOUNTER — Other Ambulatory Visit (HOSPITAL_COMMUNITY)
Admission: RE | Admit: 2019-04-06 | Discharge: 2019-04-06 | Disposition: A | Discharge status: Home / Self Care | Payer: HRSA Program | Source: Ambulatory Visit | Attending: Internal Medicine | Admitting: Internal Medicine

## 2019-04-06 DIAGNOSIS — Z01812 Encounter for preprocedural laboratory examination: Secondary | ICD-10-CM | POA: Diagnosis present

## 2019-04-06 DIAGNOSIS — Z20828 Contact with and (suspected) exposure to other viral communicable diseases: Secondary | ICD-10-CM | POA: Insufficient documentation

## 2019-04-06 LAB — CUP PACEART INCLINIC DEVICE CHECK
Battery Remaining Longevity: 75 mo
Battery Voltage: 3.19 V
Brady Statistic AP VP Percent: 70.49 %
Brady Statistic AP VS Percent: 26.21 %
Brady Statistic AS VP Percent: 0 %
Brady Statistic AS VS Percent: 3.3 %
Brady Statistic RA Percent Paced: 96.59 %
Brady Statistic RV Percent Paced: 70.49 %
Date Time Interrogation Session: 20200824164957
Implantable Lead Implant Date: 20200817
Implantable Lead Implant Date: 20200817
Implantable Lead Location: 753859
Implantable Lead Location: 753860
Implantable Lead Model: 3830
Implantable Lead Model: 5076
Implantable Pulse Generator Implant Date: 20200817
Lead Channel Impedance Value: 323 Ohm
Lead Channel Impedance Value: 456 Ohm
Lead Channel Impedance Value: 494 Ohm
Lead Channel Impedance Value: 646 Ohm
Lead Channel Pacing Threshold Amplitude: 0.875 V
Lead Channel Pacing Threshold Amplitude: 2.5 V
Lead Channel Pacing Threshold Pulse Width: 0.4 ms
Lead Channel Pacing Threshold Pulse Width: 0.4 ms
Lead Channel Sensing Intrinsic Amplitude: 2.375 mV
Lead Channel Sensing Intrinsic Amplitude: 2.375 mV
Lead Channel Sensing Intrinsic Amplitude: 2.5 mV
Lead Channel Sensing Intrinsic Amplitude: 2.625 mV
Lead Channel Setting Pacing Amplitude: 3.5 V
Lead Channel Setting Pacing Amplitude: 5 V
Lead Channel Setting Pacing Pulse Width: 1 ms
Lead Channel Setting Sensing Sensitivity: 1.2 mV

## 2019-04-06 LAB — SARS CORONAVIRUS 2 (TAT 6-24 HRS): SARS Coronavirus 2: NEGATIVE

## 2019-04-06 NOTE — Progress Notes (Signed)
Pacemaker check in clinic, added-on for [place] for High RV threshold, low R-waves. Normal device function. RA threshold, sensing, impedance consistent with previous measurements. R-waves now 2.13mV (5.42mV at implant), RV (His-septal) threshold now 3.5V @ 1.44ms bipolar, 2.25V @ 1.16ms unipolar (0.5V @ 0.8ms bipolar at implant). Per Dr. Jarold Motto will need RV lead revision, PAV/SAV extended to 365ms as pt is conducting today, RV output maintained at 5.0V @ 1.23ms bipolar, RV capture management on monitor only. RV high threshold alert turned off until revision. No mode switch or high ventricular rates noted. Histogram distribution blunted--rate response turned on per Dr. Lovena Le, ADL response and exertion response at 2, activity threshold medium low. Estimated longevity 6.2 years. Patient enrolled in remote follow-up. Patient education completed via interpreter. RV lead revision scheduled for 04/09/19.

## 2019-04-09 ENCOUNTER — Other Ambulatory Visit: Payer: Self-pay

## 2019-04-09 ENCOUNTER — Ambulatory Visit (HOSPITAL_COMMUNITY)
Admission: RE | Disposition: A | Discharge status: Home / Self Care | Payer: Self-pay | Source: Home / Self Care | Attending: Internal Medicine

## 2019-04-09 ENCOUNTER — Ambulatory Visit (HOSPITAL_COMMUNITY)
Admission: RE | Admit: 2019-04-09 | Discharge: 2019-04-10 | Disposition: A | Discharge status: Home / Self Care | Payer: Self-pay | Attending: Internal Medicine | Admitting: Internal Medicine

## 2019-04-09 DIAGNOSIS — T829XXA Unspecified complication of cardiac and vascular prosthetic device, implant and graft, initial encounter: Secondary | ICD-10-CM | POA: Diagnosis present

## 2019-04-09 DIAGNOSIS — T82110D Breakdown (mechanical) of cardiac electrode, subsequent encounter: Secondary | ICD-10-CM

## 2019-04-09 DIAGNOSIS — T82190A Other mechanical complication of cardiac electrode, initial encounter: Secondary | ICD-10-CM | POA: Insufficient documentation

## 2019-04-09 DIAGNOSIS — Z8249 Family history of ischemic heart disease and other diseases of the circulatory system: Secondary | ICD-10-CM | POA: Insufficient documentation

## 2019-04-09 DIAGNOSIS — Y831 Surgical operation with implant of artificial internal device as the cause of abnormal reaction of the patient, or of later complication, without mention of misadventure at the time of the procedure: Secondary | ICD-10-CM | POA: Insufficient documentation

## 2019-04-09 DIAGNOSIS — T82110S Breakdown (mechanical) of cardiac electrode, sequela: Secondary | ICD-10-CM

## 2019-04-09 HISTORY — PX: LEAD REVISION/REPAIR: EP1213

## 2019-04-09 SURGERY — LEAD REVISION/REPAIR

## 2019-04-09 MED ORDER — IOHEXOL 350 MG/ML SOLN
INTRAVENOUS | Status: DC | PRN
  Administered 2019-04-09: 16:00:00 15 mL via INTRAVENOUS

## 2019-04-09 MED ORDER — HEPARIN (PORCINE) IN NACL 1000-0.9 UT/500ML-% IV SOLN
INTRAVENOUS | Status: DC | PRN
  Administered 2019-04-09: 500 mL

## 2019-04-09 MED ORDER — ACETAMINOPHEN 325 MG PO TABS: 325.0000 mg | ORAL_TABLET | ORAL | Status: DC | PRN

## 2019-04-09 MED ORDER — ONDANSETRON HCL 4 MG/2ML IJ SOLN: 4.0000 mg | Freq: Four times a day (QID) | INTRAMUSCULAR | Status: DC | PRN

## 2019-04-09 MED ORDER — MUPIROCIN 2 % EX OINT: 1.0000 "application " | TOPICAL_OINTMENT | Freq: Once | CUTANEOUS | Status: DC

## 2019-04-09 MED ORDER — LIDOCAINE HCL (PF) 1 % IJ SOLN
INTRAMUSCULAR | Status: AC
  Filled 2019-04-09: qty 30

## 2019-04-09 MED ORDER — MIDAZOLAM HCL 5 MG/5ML IJ SOLN
INTRAMUSCULAR | Status: DC | PRN
  Administered 2019-04-09: 2 mg via INTRAVENOUS
  Administered 2019-04-09: 1 mg via INTRAVENOUS

## 2019-04-09 MED ORDER — LIDOCAINE HCL 1 % IJ SOLN
INTRAMUSCULAR | Status: AC
  Filled 2019-04-09: qty 60

## 2019-04-09 MED ORDER — CEFAZOLIN SODIUM-DEXTROSE 1-4 GM/50ML-% IV SOLN
1.0000 g | Freq: Four times a day (QID) | INTRAVENOUS | Status: AC
  Administered 2019-04-09 – 2019-04-10 (×3): 1 g via INTRAVENOUS
  Filled 2019-04-09 (×3): qty 50

## 2019-04-09 MED ORDER — HEPARIN (PORCINE) IN NACL 1000-0.9 UT/500ML-% IV SOLN
INTRAVENOUS | Status: AC
  Filled 2019-04-09: qty 500

## 2019-04-09 MED ORDER — SODIUM CHLORIDE 0.9 % IV SOLN
INTRAVENOUS | Status: AC
  Filled 2019-04-09: qty 2

## 2019-04-09 MED ORDER — FENTANYL CITRATE (PF) 100 MCG/2ML IJ SOLN
INTRAMUSCULAR | Status: AC
  Filled 2019-04-09: qty 2

## 2019-04-09 MED ORDER — CEFAZOLIN SODIUM-DEXTROSE 2-4 GM/100ML-% IV SOLN
INTRAVENOUS | Status: AC
  Filled 2019-04-09: qty 100

## 2019-04-09 MED ORDER — SODIUM CHLORIDE 0.9 % IV SOLN
80.0000 mg | INTRAVENOUS | Status: AC
  Administered 2019-04-09: 80 mg

## 2019-04-09 MED ORDER — MUPIROCIN 2 % EX OINT
TOPICAL_OINTMENT | CUTANEOUS | Status: AC
  Filled 2019-04-09: qty 22

## 2019-04-09 MED ORDER — CHLORTHALIDONE 25 MG PO TABS
25.0000 mg | ORAL_TABLET | Freq: Every day | ORAL | Status: DC
  Administered 2019-04-10: 09:00:00 25 mg via ORAL
  Filled 2019-04-09: qty 1

## 2019-04-09 MED ORDER — CARVEDILOL 12.5 MG PO TABS
12.5000 mg | ORAL_TABLET | Freq: Two times a day (BID) | ORAL | Status: DC
  Administered 2019-04-10: 12.5 mg via ORAL
  Filled 2019-04-09 (×4): qty 1

## 2019-04-09 MED ORDER — SODIUM CHLORIDE 0.9 % IV SOLN
INTRAVENOUS | Status: DC
  Administered 2019-04-09: 12:00:00 via INTRAVENOUS

## 2019-04-09 MED ORDER — MIDAZOLAM HCL 5 MG/5ML IJ SOLN
INTRAMUSCULAR | Status: AC
  Filled 2019-04-09: qty 5

## 2019-04-09 MED ORDER — LIDOCAINE HCL (PF) 1 % IJ SOLN
INTRAMUSCULAR | Status: DC | PRN
  Administered 2019-04-09: 30 mL

## 2019-04-09 MED ORDER — ACETAMINOPHEN 500 MG PO TABS
500.0000 mg | ORAL_TABLET | Freq: Four times a day (QID) | ORAL | Status: DC | PRN
  Administered 2019-04-09 – 2019-04-10 (×5): 500 mg via ORAL
  Filled 2019-04-09 (×5): qty 1

## 2019-04-09 MED ORDER — CEFAZOLIN SODIUM-DEXTROSE 2-4 GM/100ML-% IV SOLN
2.0000 g | INTRAVENOUS | Status: AC
  Administered 2019-04-09: 2 g via INTRAVENOUS

## 2019-04-09 MED ORDER — FENTANYL CITRATE (PF) 100 MCG/2ML IJ SOLN
INTRAMUSCULAR | Status: DC | PRN
  Administered 2019-04-09: 12.5 ug via INTRAVENOUS
  Administered 2019-04-09: 25 ug via INTRAVENOUS

## 2019-04-09 SURGICAL SUPPLY — 7 items
CABLE SURGICAL S-101-97-12 (CABLE) ×3 IMPLANT
COVER PRB 48X5XTLSCP FOLD TPE (BAG) IMPLANT
COVER PROBE 5X48 (BAG) ×3
LEAD CAPSURE NOVUS 5076-58CM (Lead) ×2 IMPLANT
PAD PRO RADIOLUCENT 2001M-C (PAD) ×3 IMPLANT
SHEATH 7FR PRELUDE SNAP 13 (SHEATH) ×2 IMPLANT
TRAY PACEMAKER INSERTION (PACKS) ×3 IMPLANT

## 2019-04-09 NOTE — Discharge Instructions (Signed)
Pacemaker Lead Replacement, Care After This sheet gives you information about how to care for yourself after your procedure. Your health care provider may also give you more specific instructions. If you have problems or questions, contact your health care provider. What can I expect after the procedure? After your procedure, it is common to have:  Mild discomfort at the incision site.  A small amount of drainage or bleeding at the incision site. This is usually no more than a spot. Follow these instructions at home: Incision care          Follow instructions from your health care provider about how to take care of your incision. Make sure you: ? Wash your hands with soap and water before you change your bandage (dressing). If soap and water are not available, use hand sanitizer. ? Change your dressing as told by your health care provider. ? Leave stitches (sutures), skin glue, or adhesive strips in place. These skin closures may need to stay in place for 2 weeks or longer. If adhesive strip edges start to loosen and curl up, you may trim the loose edges. Do not remove adhesive strips completely unless your health care provider tells you to do that.  Check your incision area every day for signs of infection. Check for: ? More redness, swelling, or pain. ? More fluid or blood. ? Warmth. ? Pus or a bad smell. Electric and Quarry manager cell phones should be kept 12 inches (30 cm) away from the pacemaker when they are on. When talking on a cell phone, use the ear on the opposite side of your pacemaker.  Do not place a cell phone in a pocket next to the pacemaker.  Household appliances do not interfere with modern-day pacemakers. Medicines  Take your antibiotic medicine as told by your health care provider. Do not stop taking the antibiotic even if you start to feel better.  Take over-the-counter and prescription medicines only as told by your health care  provider. General instructions  Do not raise the arm on the side of your procedure higher than your shoulder for at least 5 days, or as long as directed by your health care provider. Except for this restriction, continue to use your arm as normal to prevent problems.  Do not take baths, swim, or use a hot tub until your health care provider says it is okay to do so. You may shower as directed by your health care provider.  Do not lift anything that is heavier than 10 lb (4.5 kg), or the limit that your health care provider tells you, until he or she says that it is safe.  Return to your normal activities after 2 weeks, or as told by your health care provider. Ask your health care provider what activities are safe for you.  Keep all follow-up visits as told by your health care provider. This is important. Contact a health care provider if:  You have more redness, swelling, or pain around your incision.  You have more fluid or blood coming from your incision.  Your incision feels warm to the touch.  You have pus or a bad smell coming from your incision.  You have a fever.  The arm or hand on the side of the pacemaker becomes swollen.  The symptoms you had before your procedure are not getting better. Get help right away if:  You develop chest pain.  You feel like you will faint.  You feel light-headed.  You  faint. Summary  Check your incision area every day for signs of infection, such as more fluid or blood. A small amount of drainage or bleeding at the incision site is normal.  Do not raise the arm on the side of your procedure higher than your shoulder for at least 5 days, or as long as directed by your health care provider.  Digital cell phones should be kept 12 inches (30 cm) away from the pacemaker when they are on. When talking on a cell phone, use the ear on the opposite side of your pacemaker.  If the symptoms that led to having your lead replaced are not getting  better, contact your health care provider. This information is not intended to replace advice given to you by your health care provider. Make sure you discuss any questions you have with your health care provider.  After Your Pacemaker   Do not lift your arm above shoulder height for 1 week after your procedure. After 7 days, you may progress as below.     Friday April 16, 2019  Saturday April 17, 2019 Sunday April 18, 2019 Monday April 19, 2019    Do not lift, push, pull, or carry anything over 10 pounds with the affected arm until 6 weeks (Friday May 21, 2019) after your procedure.    Monitor your pacemaker site for redness, swelling, and drainage. Call the device clinic at [phone] if you experience these symptoms or fever/chills.   If your incision is sealed with Steri-strips or staples. You may showed 7 days after your procedure and wash your incision with soap and water as long as it is healed. If your incision is closed with Dermabond/Surgical glue. You may shower 1 day after your pacemaker implant and wash your incision with soap and water. Avoid lotions, ointments, or perfumes over your incision until it is well-healed.   You may use a hot tub or a pool AFTER your wound check appointment if the incision is completely closed.   You may drive, unless driving has been restricted by your healthcare providers.   Your Pacemaker may be MRI compatible. We will discuss this at your office visit/Wound check   Remote monitoring is used to monitor your pacemaker from home. This monitoring is scheduled every 91 days by our office. It allows [place] to keep an eye on the functioning of your device to ensure it is working properly. You will routinely see your Electrophysiologist annually (more often if necessary).   Despus del implante de su marcapasos    No levante el brazo por encima de la altura de los hombros durante 1 semana despus de su procedimiento. Despus de [address] puede progresar como se indica a continuacin.    Friday April 16, 2019  Saturday April 17, 2019 Sunday April 18, 2019 Monday April 19, 2019    No levante ms de 10 libras con el brazo afectado hasta 6 semanas despus del procedimiento. No hay otras restricciones en cuanto al movimiento del brazo despus de su cita de revisin de la herida.    Monitoree el sitio de su marcapasos para ver si tiene enrojecimiento, hinchazn y drenaje. Llame a la [employer] colocaron el dispositivo al [phone] si tiene los sntomas anteriores, o fiebre/resfriado.    Si su herida se cierra con tiritas esterilizadas (Steri-strips) o con grapas: Puede ducharse 7 das despus del procedimiento y lavar su herida con agua y [place]. Si su herida se cierra con Dermabond: Puede ducharse un  da despus del implante del marcapasos y [place] su herida con agua y [place]. Evite el uso de lociones, ungentos o perfumes sobre su herida hasta que est completamente sanada.    Si la herida est completamente cerrada, puede usar un jacuzzi o una piscina despus de su cita de revisin de la herida.    Puede conducir, a menos que sus proveedores de atencin mdica le hayan dicho que no puede conducir.    Es posible su marcapasos es compatible con Visual merchandiser de IRM (Imagen por Resonancia Magntica). Pregunte en su prxima cita.   La vigilancia remota se [place] para monitorear el marcapasos desde su casa. Esta vigilancia se programa cada [address] en nuestra oficina. Esto nos permite vigilar el funcionamiento de su dispositivo para asegurarnos de que funciona correctamente. Usted ver a su electrofisilogo anualmente (si es necesario, le ver ms a menudo).

## 2019-04-09 NOTE — Discharge Summary (Signed)
ELECTROPHYSIOLOGY PROCEDURE DISCHARGE SUMMARY    Patient ID: Anita [name],  MRN: [id number], DOB/AGE: 1956/08/06 63 y.o.  Admit date: 04/09/2019 Discharge date: 04/10/2019    Primary Care Physician: Patient, No Pcp Per  Primary Cardiologist: Dr. Lovena Le  Primary Discharge Diagnosis:  Pacemaker lead malfunction  No Known Allergies  Procedures This Admission: LEAD REVISION/REPAIR 04/09/2019 with Dr. Lovena Le  Brief HPI: Pt presented to device clinic 04/05/2019 after [facility] for High RV threshold with low R-waves.  Testing in office demonstrated R waves 2.73mV (5.19mV at implant) and RV (His-septal) threshold now 3.5V @ 1.52ms bipolar, 2.25V @ 1.66ms unipolar (0.5V @ 0.51ms bipolar at implant). Per Dr. Jarold Motto will need RV lead revision, PAV/SAV extended to 362ms as pt is conducting today, RV output maintained at 5.0V @ 1.50ms bipolar, RV capture management on monitor only. RV high threshold alert turned off until revision. No mode switch or high ventricular rates noted  Hospital Course: Pt underwent Lead revision/repair 04/09/2019 with Dr. Lovena Le. There were no immediate complications. The procedure demonstrated successful RV lead revision with a new lead placed into the RV septum.  CXR on 04/10/2019 demonstrated very little lead slack on RA or RV leads and no pneumothorax.   Device interrogation was normal (personally reviewed).   Caution with are movement was advised.  Close follow-up in the device clinic was also advised.  Message sent to Dr Lovena Le to review lead slack.  Given normal device function and lead measurement stability overnight, discharge was felt to be safe for the patient who is not device dependant today.  The pt was examined am of discharged and determined stable for home with close follow up as below.   Physical Exam: Vitals:   04/09/19 2310 04/10/19 0329 04/10/19 0706 04/10/19 0819  BP: 118/75 116/73  132/70  Pulse: 60 60 61 60  Resp: 13 19  13   Temp: 98.6 F (37 C)  98.5 F (36.9 C)  98.2 F (36.8 C)  TempSrc: Oral Oral  Oral  SpO2: 98% 98%  98%  Weight:      Height:        GEN- The patient is morbidly obese appearing, alert  HEENT: normocephalic, atraumatic; sclera clear, conjunctiva pink; hearing intact; oropharynx clear; neck supple, no JVP Lungs-  normal work of breathing  Heart- Regular rate and rhythm  GI- soft  Extremities- + dependant edema MS- no significant deformity or atrophy Skin- device pocket is without hematoma Psych- euthymic mood, full affect Neuro- strength and sensation are intact    Labs:   Lab Results  Component Value Date   WBC 8.1 03/26/2019   HGB 14.3 03/26/2019   HCT 42.0 03/26/2019   MCV 88.5 03/26/2019   PLT 234 03/26/2019   No results for input(s): NA, K, CL, CO2, BUN, CREATININE, CALCIUM, PROT, BILITOT, ALKPHOS, ALT, AST, GLUCOSE in the last 168 hours.  Invalid input(s): LABALBU   Discharge Medications:  Allergies as of 04/10/2019   No Known Allergies     Medication List    TAKE these medications   acetaminophen 500 MG tablet Commonly known as: TYLENOL Take 500 mg by mouth every 6 (six) hours as needed for moderate pain or headache.   carvedilol 12.5 MG tablet Commonly known as: COREG Take 1 tablet (12.5 mg total) by mouth 2 (two) times daily with a meal.   chlorthalidone 25 MG tablet Commonly known as: HYGROTON Take 1 tablet (25 mg total) by mouth daily.       Disposition:  Pt is being discharged home today in good condition.  Follow-up Information    [facility] Follow up on 04/27/2019.   Why: at 230 pm for wound check Contact information: [place] [phone]          Duration of Discharge Encounter: Greater than 30 minutes including physician time.  Signed, Thompson Grayer, MD  04/10/2019 11:14 AM

## 2019-04-09 NOTE — H&P (Signed)
HPI The patient is a pleasant 63 yo woman with a h/o syncope and found to have pauses of up to 18 seconds who underwent PPM last week. She has had dislodgment of her PM lead. She presents today for lead revision. No Known Allergies   Current Facility-Administered Medications  Medication Dose Route Frequency Provider Last Rate Last Dose  . 0.9 %  sodium chloride infusion   Intravenous Continuous Evans Lance, MD 50 mL/hr at 04/09/19 1154    . ceFAZolin (ANCEF) IVPB 2g/100 mL premix  2 g Intravenous On Call Evans Lance, MD      . gentamicin (GARAMYCIN) 80 mg in sodium chloride 0.9 % 500 mL irrigation  80 mg Irrigation On Call Evans Lance, MD      . mupirocin ointment (BACTROBAN) 2 % 1 application  1 application Topical Once Evans Lance, MD         Past Medical History:  Diagnosis Date  . ABDOMINAL PAIN 05/05/2009   Qualifier: Diagnosis of  By: Jorene Minors, Scott    . Adjustment disorder with depressed mood 08/30/2009   Qualifier: Diagnosis of  By: Jorene Minors, Scott    . ALLERGIC RHINITIS 12/04/2009   Qualifier: Diagnosis of  By: Jorene Minors, Scott    . Allergy   . Aortic stenosis    mild by echo 03/2019  . CHOLELITHIASIS 05/30/2009   Qualifier: Diagnosis of  By: Jorene Minors, Scott    . COLONIC POLYPS, HX OF 05/09/2010   Qualifier: Diagnosis of  By: Jorene Minors, Scott    . Essential hypertension, benign 05/05/2009   Qualifier: Diagnosis of  By: Jorene Minors, Scott    . GERD 06/12/2009   Qualifier: Diagnosis of  By: Jorene Minors, Scott    . Hypertension   . KNEE PAIN 06/12/2009   Qualifier: Diagnosis of  By: Jorene Minors, Scott    . LIVER MASS 05/30/2009   Qualifier: Diagnosis of  By: Jorene Minors, Scott    . OBESITY 10/24/2010   Qualifier: Diagnosis of  By: Keenan Bachelor RN, Rip Harbour    . TINNITUS, CHRONIC, RIGHT 09/07/2010   Qualifier: Diagnosis of  By: Amil Amen MD, Benjamine Mola    . TOOTH LOSS 09/07/2010   Qualifier: Diagnosis of  By: Amil Amen MD, Benjamine Mola    . TRICHOMONAL  VAGINITIS 08/30/2009   Qualifier: Diagnosis of  By: Jorene Minors, Scott    . VARICOSE VEINS, LOWER EXTREMITIES 05/05/2009   Qualifier: Diagnosis of  By: Jorene Minors, Scott      ROS:   All systems reviewed and negative except as noted in the HPI.   Past Surgical History:  Procedure Laterality Date  . ABDOMINAL HYSTERECTOMY    . CESAREAN SECTION    . PACEMAKER IMPLANT N/A 03/29/2019   Procedure: PACEMAKER IMPLANT;  Surgeon: Evans Lance, MD;  Location: [facility];  Service: Cardiovascular;  Laterality: N/A;     Family History  Problem Relation Age of Onset  . Hypertension Sister   . Hyperlipidemia Sister   . Hypertension Brother   . Hyperlipidemia Brother   . Migraines Daughter   . Hypertension Sister   . Hyperlipidemia Sister   . Cancer Sister   . Hypertension Sister   . Hyperlipidemia Sister   . Hypertension Brother   . Hyperlipidemia Brother   . Hypertension Brother   . Hyperlipidemia Brother   . Hypertension Brother   . Hyperlipidemia Brother   . Hypertension Brother   . Hyperlipidemia  Brother   . Diabetes Paternal Grandmother   . Hypertension Paternal Grandmother      Social History   Socioeconomic History  . Marital status: Married    Spouse name: Freida Busman  . Number of children: 5  . Years of education: 6th grade  . Highest education level: Not on file  Occupational History  . Occupation: homemaker  Social Needs  . Financial resource strain: Not on file  . Food insecurity    Worry: Not on file    Inability: Not on file  . Transportation needs    Medical: Not on file    Non-medical: Not on file  Tobacco Use  . Smoking status: Never Smoker  . Smokeless tobacco: Never Used  Substance and Sexual Activity  . Alcohol use: No    Alcohol/week: 0.0 standard drinks  . Drug use: No  . Sexual activity: Not on file  Lifestyle  . Physical activity    Days per week: Not on file    Minutes per session: Not on file  . Stress: Not on file   Relationships  . Social Herbalist on phone: Not on file    Gets together: Not on file    Attends religious service: Not on file    Active member of club or organization: Not on file    Attends meetings of clubs or organizations: Not on file    Relationship status: Not on file  . Intimate partner violence    Fear of current or ex partner: Not on file    Emotionally abused: Not on file    Physically abused: Not on file    Forced sexual activity: Not on file  Other Topics Concern  . Not on file  Social History Narrative   From [place], [place]. Came to the [place] 2002. Lives with her husband. Their son and grandson live nearby.     BP (!) 157/99   Pulse 68   Temp 99 F (37.2 C) (Oral)   Resp 16   Ht 5\' 4"  (1.626 m)   Wt 104.3 kg   SpO2 100%   BMI 39.48 kg/m   Physical Exam:  obese appearing NAD HEENT: Unremarkable Neck:  No JVD, no thyromegally Lymphatics:  No adenopathy Back:  No CVA tenderness Lungs:  Clear with no wheezes HEART:  Regular rate rhythm, no murmurs, no rubs, no clicks Abd:  soft, positive bowel sounds, no organomegally, no rebound, no guarding Ext:  2 plus pulses, no edema, no cyanosis, no clubbing Skin:  No rashes no nodules Neuro:  CN II through XII intact, motor grossly intact  DEVICE  Normal device function.  See PaceArt for details.  RV lead with failure to capture.  Assess/Plan:  1. Heart block - she is s/p PPM and has developed non-function of her lead. I have discussed the indications/risks/benefits/goals/expectations of PPM lead revision and she wishes to proceed.  Mikle Bosworth.D.

## 2019-04-09 NOTE — Plan of Care (Signed)
  Problem: Coping: Goal: Level of anxiety will decrease Outcome: Progressing   Problem: Pain Managment: Goal: General experience of comfort will improve Outcome: Progressing   

## 2019-04-10 ENCOUNTER — Ambulatory Visit (HOSPITAL_COMMUNITY): Payer: Self-pay

## 2019-04-10 DIAGNOSIS — T829XXD Unspecified complication of cardiac and vascular prosthetic device, implant and graft, subsequent encounter: Secondary | ICD-10-CM

## 2019-04-10 DIAGNOSIS — T82110S Breakdown (mechanical) of cardiac electrode, sequela: Secondary | ICD-10-CM

## 2019-04-10 DIAGNOSIS — R001 Bradycardia, unspecified: Secondary | ICD-10-CM

## 2019-04-10 NOTE — Progress Notes (Signed)
Pt left unit in wheelchair accompanied by daughter and nurse tech.

## 2019-04-12 ENCOUNTER — Encounter (HOSPITAL_COMMUNITY): Payer: Self-pay | Admitting: Internal Medicine

## 2019-04-12 MED FILL — Lidocaine HCl Local Inj 1%: INTRAMUSCULAR | Qty: 20 | Status: AC

## 2019-04-12 MED FILL — Lidocaine HCl Local Inj 1%: INTRAMUSCULAR | Qty: 40 | Status: AC

## 2019-04-13 ENCOUNTER — Ambulatory Visit: Payer: Self-pay

## 2019-04-27 ENCOUNTER — Ambulatory Visit (INDEPENDENT_AMBULATORY_CARE_PROVIDER_SITE_OTHER): Payer: Self-pay | Admitting: *Deleted

## 2019-04-27 ENCOUNTER — Other Ambulatory Visit: Payer: Self-pay

## 2019-04-27 DIAGNOSIS — R55 Syncope and collapse: Secondary | ICD-10-CM

## 2019-04-27 LAB — CUP PACEART INCLINIC DEVICE CHECK
Battery Remaining Longevity: 158 mo
Battery Voltage: 3.21 V
Brady Statistic AP VP Percent: 15.93 %
Brady Statistic AP VS Percent: 82.8 %
Brady Statistic AS VP Percent: 0.01 %
Brady Statistic AS VS Percent: 1.26 %
Brady Statistic RA Percent Paced: 98.64 %
Brady Statistic RV Percent Paced: 15.94 %
Date Time Interrogation Session: 20200915174704
Implantable Lead Implant Date: 20200817
Implantable Lead Implant Date: 20200828
Implantable Lead Location: 753859
Implantable Lead Location: 753860
Implantable Lead Model: 5076
Implantable Lead Model: 5076
Implantable Pulse Generator Implant Date: 20200817
Lead Channel Impedance Value: 399 Ohm
Lead Channel Impedance Value: 418 Ohm
Lead Channel Impedance Value: 456 Ohm
Lead Channel Impedance Value: 608 Ohm
Lead Channel Pacing Threshold Amplitude: 0.5 V
Lead Channel Pacing Threshold Amplitude: 0.5 V
Lead Channel Pacing Threshold Pulse Width: 0.4 ms
Lead Channel Pacing Threshold Pulse Width: 0.4 ms
Lead Channel Sensing Intrinsic Amplitude: 12.875 mV
Lead Channel Sensing Intrinsic Amplitude: 3 mV
Lead Channel Setting Pacing Amplitude: 1.5 V
Lead Channel Setting Pacing Amplitude: 3.5 V
Lead Channel Setting Pacing Pulse Width: 0.4 ms
Lead Channel Setting Sensing Sensitivity: 1.2 mV

## 2019-04-27 NOTE — Progress Notes (Signed)
Wound check appointment. Steri-strips removed. Wound without redness or edema. Incision edges approximated, wound well healed. Normal device function. Thresholds, sensing, and impedances consistent with implant measurements. Device programmed at 3.5V/auto capture programmed on for extra safety margin until 3 month visit. Histogram distribution appropriate for patient and level of activity. No mode switches or high ventricular rates noted. Patient educated about wound care, arm mobility, lifting restrictions. ROV with Dr Lovena Le on 07/12/2019. Remote transmissions scheduled and next remote 10/11/2019.

## 2019-04-27 NOTE — Patient Instructions (Signed)
Call clinic if you develop drainage, redness and edema at incision site. Follow-up with family doctor concerning dizziness with position change.

## 2019-05-02 NOTE — Progress Notes (Unsigned)
Patient ID: Anita [name]                 DOB: May 15, 1956                      MRN: [id number]     HPI: Anita [name] is a 63 y.o. female referred by Dr. Radford Pax to HTN clinic. PMH is significant for HTN. Patient was last seen by Dr. Radford Pax on 7/30 for pre-syncope and uncontrolled HTN. The patient was admitted to the hospital on 8/14 for PPM after syncopal episode. Patient was last seen in pharmacist clinic on 8/24 and BP had improved to 132/76. She was instructed to continue taking her carvedilol 12.5 mg BID and chlorthalidone 25 mg daily.  Since this appointment, patient was admitted to the hospital on 8/28 for pacemaker lead failure and underwent lead revision/repair. She was discharged on 8/29.  Patient presents to clinic today for HTN follow up.    - PPM issues? - dizziness, edema? - tingling/burning sensation in legs? - BP log - diet? - allowed to start exercise per cardiology? - PCP?   Current HTN meds: carvedilol 12.5 mg BID, chlorthalidone 25 mg daily  Previously tried: lisinopril (bilateral arm pain, chills); valsartan (discontinued at hospital discharge on 8/18); metoprolol succinate and hydrochlorothiazide (stopped taking ~6 years ago, discontinued by MD)  BP goal: <130/80  Family History: Father - CAD, CABG; Mother - DCM  Social History: never smoker, no alcohol use  Diet: No longer eats fried foods, has cut out tortillas but still eats some bread. Enjoys fish, chicken, and red meat. Few vegetables and fruits. Drinking less soda and more water.  Exercise: None - was told by cardiologist to take it slow after receiving PPM  Home BP readings:   Wt Readings from Last 3 Encounters:  04/09/19 230 lb (104.3 kg)  04/03/19 230 lb (104.3 kg)  03/28/19 239 lb 1.6 oz (108.5 kg)   BP Readings from Last 3 Encounters:  04/10/19 128/75  04/05/19 132/76  04/03/19 (!) 160/70   Pulse Readings from Last 3 Encounters:  04/10/19 60  04/05/19 60  04/03/19 65    Renal  function: CrCl cannot be calculated (Patient's most recent lab result is older than the maximum 21 days allowed.).  Past Medical History:  Diagnosis Date  . ABDOMINAL PAIN 05/05/2009   Qualifier: Diagnosis of  By: Jorene Minors, Scott    . Adjustment disorder with depressed mood 08/30/2009   Qualifier: Diagnosis of  By: Jorene Minors, Scott    . ALLERGIC RHINITIS 12/04/2009   Qualifier: Diagnosis of  By: Jorene Minors, Scott    . Allergy   . Aortic stenosis    mild by echo 03/2019  . CHOLELITHIASIS 05/30/2009   Qualifier: Diagnosis of  By: Jorene Minors, Scott    . COLONIC POLYPS, HX OF 05/09/2010   Qualifier: Diagnosis of  By: Jorene Minors, Scott    . Essential hypertension, benign 05/05/2009   Qualifier: Diagnosis of  By: Jorene Minors, Scott    . GERD 06/12/2009   Qualifier: Diagnosis of  By: Jorene Minors, Scott    . Hypertension   . KNEE PAIN 06/12/2009   Qualifier: Diagnosis of  By: Jorene Minors, Scott    . LIVER MASS 05/30/2009   Qualifier: Diagnosis of  By: Jorene Minors, Scott    . OBESITY 10/24/2010   Qualifier: Diagnosis of  By: Keenan Bachelor RN, Rip Harbour    . TINNITUS, CHRONIC, RIGHT 09/07/2010   Qualifier: Diagnosis of  ByAmil Amen MD, Benjamine Mola    . TOOTH LOSS 09/07/2010   Qualifier: Diagnosis of  By: Amil Amen MD, Benjamine Mola    . 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 VARICOSE VEINS, LOWER EXTREMITIES 05/05/2009   Qualifier: Diagnosis of  By: Versie Starks      Current Outpatient Medications on File Prior to Visit  Medication Sig Dispense Refill  . acetaminophen (TYLENOL) 500 MG tablet Take 500 mg by mouth every 6 (six) hours as needed for moderate pain or headache.    . carvedilol (COREG) 12.5 MG tablet Take 1 tablet (12.5 mg total) by mouth 2 (two) times daily with a meal. 60 tablet 6  . chlorthalidone (HYGROTON) 25 MG tablet Take 1 tablet (25 mg total) by mouth daily. 90 tablet 3   No current facility-administered medications on file  prior to visit.     No Known Allergies   Assessment/Plan:  1. Hypertension -    Vertis Kelch, PharmD PGY2 Cardiology Pharmacy Resident [place] [id number] [place], [place] [address] Phone: [phone]; Fax: [phone]

## 2019-05-03 ENCOUNTER — Ambulatory Visit: Payer: Self-pay

## 2019-05-04 ENCOUNTER — Other Ambulatory Visit: Payer: Self-pay

## 2019-05-14 ENCOUNTER — Other Ambulatory Visit: Payer: Self-pay

## 2019-05-14 ENCOUNTER — Ambulatory Visit (INDEPENDENT_AMBULATORY_CARE_PROVIDER_SITE_OTHER): Payer: Self-pay | Admitting: Pharmacist

## 2019-05-14 VITALS — BP 146/84 | HR 69

## 2019-05-14 DIAGNOSIS — I10 Essential (primary) hypertension: Secondary | ICD-10-CM

## 2019-05-14 MED ORDER — SPIRONOLACTONE 25 MG PO TABS: 25.0000 mg | ORAL_TABLET | Freq: Every day | ORAL | 11 refills | Status: DC

## 2019-05-14 NOTE — Patient Instructions (Addendum)
Nice seeing you today!  Your blood pressure was less than goal of <130/80.   Start taking Spironolactone 25 mg daily in the morning. Stay hydrated and drink plenty of water.  Continue taking Carvedilol twice a day and Chlorthalidone once a day as usual.  Continue to monitor blood pressure and write the readings in your journal at home.   Bring these to your next follow-up visit in 2-3 weeks.

## 2019-05-14 NOTE — Progress Notes (Signed)
Patient ID: Anita [name]                 DOB: Jul 19, 1956                      MRN: [id number]     HPI: Anita [name] is a 63 y.o. female referred by Dr. Radford Pax to HTN clinic. PMH is significant for HTN and was recently discharged on 8/18 with a PPM for a syncopal episode. Patient was last seen by the pharmacist, Vertis Kelch, on 8/24 for uncontrolled HTN and was continued on her home regimen since her last BP was near goal at 132/76.   Patient presents to clinic today with an interpreter for HTN follow-up. Pt reports compliance with her BP meds; took them this morning prior to office visit. Pt experiences some dizziness if she stands up too quickly, however she states she is not drinking enough water. Today's BP in clinic was above goal 146/84.  Current HTN meds: Carvedilol 12.5 mg BID, chlorthalidone 25 mg daily; reports not using any NSAIDs  Previously tried: lisinopril (bilateral arm pain, chills); valsartan (discontinued at hospital discharge due to bilateral arm pain); metoprolol succinateand hydrochlorothiazide (stopped taking ~6 years ago, discontinued by MD)   BP goal: <130/80 per 2017 ACC/AHA  Family History: Father - CAD, CABG; Mother - DCM  Social History: never smoker, no alcohol use  Diet: consist of chicken, fish, cheese, "a little salt", no coffee, drinks tea twice a day, does not drink enough water  Exercise: No exercising due to shortness of breath and physcial deconditioning   Home BP readings: Over the past week = 140-150s/70-80s (highest 176/101) Pulse ranges 60-70s   Wt Readings from Last 3 Encounters:  04/09/19 230 lb (104.3 kg)  04/03/19 230 lb (104.3 kg)  03/28/19 239 lb 1.6 oz (108.5 kg)   BP Readings from Last 3 Encounters:  04/10/19 128/75  04/05/19 132/76  04/03/19 (!) 160/70   Pulse Readings from Last 3 Encounters:  04/10/19 60  04/05/19 60  04/03/19 65    Renal function: CrCl cannot be calculated (Patient's most recent lab result is  older than the maximum 21 days allowed.).  Past Medical History:  Diagnosis Date  . ABDOMINAL PAIN 05/05/2009   Qualifier: Diagnosis of  By: Jorene Minors, Scott    . Adjustment disorder with depressed mood 08/30/2009   Qualifier: Diagnosis of  By: Jorene Minors, Scott    . ALLERGIC RHINITIS 12/04/2009   Qualifier: Diagnosis of  By: Jorene Minors, Scott    . Allergy   . Aortic stenosis    mild by echo 03/2019  . CHOLELITHIASIS 05/30/2009   Qualifier: Diagnosis of  By: Jorene Minors, Scott    . COLONIC POLYPS, HX OF 05/09/2010   Qualifier: Diagnosis of  By: Jorene Minors, Scott    . Essential hypertension, benign 05/05/2009   Qualifier: Diagnosis of  By: Jorene Minors, Scott    . GERD 06/12/2009   Qualifier: Diagnosis of  By: Jorene Minors, Scott    . Hypertension   . KNEE PAIN 06/12/2009   Qualifier: Diagnosis of  By: Jorene Minors, Scott    . LIVER MASS 05/30/2009   Qualifier: Diagnosis of  By: Jorene Minors, Scott    . OBESITY 10/24/2010   Qualifier: Diagnosis of  By: Keenan Bachelor RN, Rip Harbour    . TINNITUS, CHRONIC, RIGHT 09/07/2010   Qualifier: Diagnosis of  By: Amil Amen MD, Benjamine Mola    . TOOTH LOSS 09/07/2010   Qualifier:  Diagnosis of  By: Amil Amen MD, Benjamine Mola    . 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 VARICOSE VEINS, LOWER EXTREMITIES 05/05/2009   Qualifier: Diagnosis of  By: Versie Starks      Current Outpatient Medications on File Prior to Visit  Medication Sig Dispense Refill  . acetaminophen (TYLENOL) 500 MG tablet Take 500 mg by mouth every 6 (six) hours as needed for moderate pain or headache.    . carvedilol (COREG) 12.5 MG tablet Take 1 tablet (12.5 mg total) by mouth 2 (two) times daily with a meal. 60 tablet 6  . chlorthalidone (HYGROTON) 25 MG tablet Take 1 tablet (25 mg total) by mouth daily. 90 tablet 3   No current facility-administered medications on file prior to visit.     No Known Allergies   Assessment/Plan:  1.  Hypertension - Pt's BP is above goal <130/80. Start Spironolactone 25 mg daily in the morning. Continue Carvedilol 12.5 mg BID and Chlorthalidone 25 mg daily. Encouraged patient to stay hydrated and drink plenty of water. Discussed importance of exercise and diet - using minimal salt when cooking and avoiding foods high in carb. Continue to monitor blood pressure and write readings in a BP log and bring it to the next follow-up visit. Recommend checking BMP at next visit to monitor for any electrolyte disturbances.    Follow-up in HTN clinic in 2-3 weeks.    Lorel Monaco, PharmD PGY1 [facility] [id number], [place], [place] [address] Phone: [phone]; Fax: [phone]

## 2019-05-19 ENCOUNTER — Encounter (HOSPITAL_COMMUNITY): Payer: Self-pay | Admitting: Emergency Medicine

## 2019-05-19 ENCOUNTER — Other Ambulatory Visit: Payer: Self-pay

## 2019-05-19 ENCOUNTER — Emergency Department (HOSPITAL_COMMUNITY)
Admission: EM | Admit: 2019-05-19 | Discharge: 2019-05-20 | Disposition: A | Discharge status: Home / Self Care | Payer: Self-pay | Attending: Emergency Medicine | Admitting: Emergency Medicine

## 2019-05-19 DIAGNOSIS — I10 Essential (primary) hypertension: Secondary | ICD-10-CM | POA: Insufficient documentation

## 2019-05-19 DIAGNOSIS — Z950 Presence of cardiac pacemaker: Secondary | ICD-10-CM | POA: Insufficient documentation

## 2019-05-19 DIAGNOSIS — R519 Headache, unspecified: Secondary | ICD-10-CM | POA: Insufficient documentation

## 2019-05-19 DIAGNOSIS — M62838 Other muscle spasm: Secondary | ICD-10-CM | POA: Insufficient documentation

## 2019-05-19 DIAGNOSIS — E876 Hypokalemia: Secondary | ICD-10-CM | POA: Insufficient documentation

## 2019-05-19 DIAGNOSIS — R002 Palpitations: Secondary | ICD-10-CM | POA: Insufficient documentation

## 2019-05-19 NOTE — ED Triage Notes (Signed)
C/o pain to bilateral feet and headache x 2-3 days.  No neuro deficits noted.

## 2019-05-20 ENCOUNTER — Telehealth: Payer: Self-pay | Admitting: Internal Medicine

## 2019-05-20 ENCOUNTER — Telehealth: Payer: Self-pay | Admitting: *Deleted

## 2019-05-20 DIAGNOSIS — E876 Hypokalemia: Secondary | ICD-10-CM

## 2019-05-20 LAB — CBC WITH DIFFERENTIAL/PLATELET
Abs Immature Granulocytes: 0.04 10*3/uL (ref 0.00–0.07)
Basophils Absolute: 0 10*3/uL (ref 0.0–0.1)
Basophils Relative: 0 %
Eosinophils Absolute: 0.2 10*3/uL (ref 0.0–0.5)
Eosinophils Relative: 3 %
HCT: 41.3 % (ref 36.0–46.0)
Hemoglobin: 14.6 g/dL (ref 12.0–15.0)
Immature Granulocytes: 1 %
Lymphocytes Relative: 37 %
Lymphs Abs: 2.4 10*3/uL (ref 0.7–4.0)
MCH: 30.7 pg (ref 26.0–34.0)
MCHC: 35.4 g/dL (ref 30.0–36.0)
MCV: 86.8 fL (ref 80.0–100.0)
Monocytes Absolute: 0.4 10*3/uL (ref 0.1–1.0)
Monocytes Relative: 7 %
Neutro Abs: 3.4 10*3/uL (ref 1.7–7.7)
Neutrophils Relative %: 52 %
Platelets: 225 10*3/uL (ref 150–400)
RBC: 4.76 MIL/uL (ref 3.87–5.11)
RDW: 12.6 % (ref 11.5–15.5)
WBC: 6.5 10*3/uL (ref 4.0–10.5)
nRBC: 0 % (ref 0.0–0.2)

## 2019-05-20 LAB — BASIC METABOLIC PANEL
Anion gap: 12 (ref 5–15)
BUN: 11 mg/dL (ref 8–23)
CO2: 24 mmol/L (ref 22–32)
Calcium: 9.4 mg/dL (ref 8.9–10.3)
Chloride: 101 mmol/L (ref 98–111)
Creatinine, Ser: 0.73 mg/dL (ref 0.44–1.00)
GFR calc Af Amer: >60 mL/min (ref >60)
GFR calc non Af Amer: >60 mL/min (ref >60)
Glucose, Bld: 102 mg/dL — ABNORMAL HIGH (ref 70–99)
Potassium: 2.6 mmol/L — CL (ref 3.5–5.1)
Sodium: 137 mmol/L (ref 135–145)

## 2019-05-20 LAB — MAGNESIUM: Magnesium: 1.9 mg/dL (ref 1.7–2.4)

## 2019-05-20 MED ORDER — POTASSIUM CHLORIDE CRYS ER 20 MEQ PO TBCR: 20.0000 meq | EXTENDED_RELEASE_TABLET | Freq: Two times a day (BID) | ORAL | 0 refills | Status: DC

## 2019-05-20 MED ORDER — SODIUM CHLORIDE 0.9 % IV BOLUS
500.0000 mL | Freq: Once | INTRAVENOUS | Status: AC
  Administered 2019-05-20: 03:00:00 500 mL via INTRAVENOUS

## 2019-05-20 MED ORDER — POTASSIUM CHLORIDE CRYS ER 20 MEQ PO TBCR
40.0000 meq | EXTENDED_RELEASE_TABLET | Freq: Once | ORAL | Status: AC
  Administered 2019-05-20: 03:00:00 40 meq via ORAL
  Filled 2019-05-20: qty 2

## 2019-05-20 MED ORDER — POTASSIUM CHLORIDE 10 MEQ/100ML IV SOLN
10.0000 meq | Freq: Once | INTRAVENOUS | Status: AC
  Administered 2019-05-20: 03:00:00 10 meq via INTRAVENOUS
  Filled 2019-05-20: qty 100

## 2019-05-20 NOTE — Telephone Encounter (Signed)
Spoke to patient's daughter. Patient in ER last night found to be hypokalemic at 2.6. IV supplementation given. There has been no recheck. Patient was instructed to call the office for instructions on medication. Patient is being followed by HTN Clinic and is on chlorthalidone 25 mg QD, spironolactone 25 mg QD, carvedilol 12.5 mg BID, and now potassium 20 mEq BID. Patient has not had any meds today except for potassium due to not knowing which medicines to take. Spoke with Jinny Blossom, PharmD. Patient is to stop chlorthalidone and have BMET Monday. Made patient's daughter aware. Instructed for patient to continue to monitor BP. She verbalized understanding and thanked me for the call.

## 2019-05-20 NOTE — ED Notes (Signed)
Pot run complete

## 2019-05-20 NOTE — ED Notes (Signed)
Potassium run  Burning her hand  Iv slowed to 7ml

## 2019-05-20 NOTE — Telephone Encounter (Signed)
Pharmacy called related to Rx: Klor-Con 20 MEQ as pt is also on spironolactone 25 mg .Marland KitchenMarland KitchenEDCM clarified with EDP (Hedges) to prescribe as written.

## 2019-05-20 NOTE — Telephone Encounter (Signed)
New message   Follow-Ups: Follow up with [facility]; To discuss need for medication adjustment  Per patient's daughter is following up on medication adjustment per the discharge paperwork. Please call.

## 2019-05-20 NOTE — ED Notes (Signed)
PT SPEAKS SPANISH ONLY

## 2019-05-20 NOTE — ED Provider Notes (Signed)
[facility]PART[facility] Provider Note  CSN: [id number] Arrival date & time: 05/19/19 [id number]  Chief Complaint(s) Foot Pain and Headache  HPI Anita [name] Pain is a 63 y.o. female with a history of sinus node dysfunction status post pacemaker who presents to the emergency department with 1 to 2 weeks of intermittent leg pain as well as headache and dizziness upon standing.  Patient reports that she has chronic left leg pain however she is developed new right leg pain last week.  This pain is intermittent, brief episodes of sharp shooting pain to the right thigh which resolve spontaneously.  Patient also reports that she has occipital pain similar to the leg pain.  She is also been having intermittent dizziness upon standing with palpitations.  Denies any associated chest pain or shortness of breath.  No lower extremity edema.  No recent fevers or chills.  No nausea vomiting.  No other physical complaints.  Currently patient is asymptomatic.  HPI  Past Medical History Past Medical History:  Diagnosis Date  . ABDOMINAL PAIN 05/05/2009   Qualifier: Diagnosis of  By: Jorene Minors, Scott    . Adjustment disorder with depressed mood 08/30/2009   Qualifier: Diagnosis of  By: Jorene Minors, Scott    . ALLERGIC RHINITIS 12/04/2009   Qualifier: Diagnosis of  By: Jorene Minors, Scott    . Allergy   . Aortic stenosis    mild by echo 03/2019  . CHOLELITHIASIS 05/30/2009   Qualifier: Diagnosis of  By: Jorene Minors, Scott    . COLONIC POLYPS, HX OF 05/09/2010   Qualifier: Diagnosis of  By: Jorene Minors, Scott    . Essential hypertension, benign 05/05/2009   Qualifier: Diagnosis of  By: Jorene Minors, Scott    . GERD 06/12/2009   Qualifier: Diagnosis of  By: Jorene Minors, Scott    . Hypertension   . KNEE PAIN 06/12/2009   Qualifier: Diagnosis of  By: Jorene Minors, Scott    . LIVER MASS 05/30/2009   Qualifier: Diagnosis of  By: Jorene Minors, Scott    . OBESITY 10/24/2010   Qualifier:  Diagnosis of  By: Keenan Bachelor RN, Rip Harbour    . TINNITUS, CHRONIC, RIGHT 09/07/2010   Qualifier: Diagnosis of  By: Amil Amen MD, Benjamine Mola    . TOOTH LOSS 09/07/2010   Qualifier: Diagnosis of  By: Amil Amen MD, Benjamine Mola    . 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 VARICOSE VEINS, LOWER EXTREMITIES 05/05/2009   Qualifier: Diagnosis of  By: Jorene Minors, Scott     Patient Active Problem List   Diagnosis Date Noted  . Complication associated with cardiac pacemaker lead 04/09/2019  . 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 Syncope 03/27/2019  . Aortic stenosis   . BMI 45.0-49.9, adult ([facility]) 10/19/2014  . Venous stasis dermatitis of left lower extremity 10/19/2014  . OBESITY 10/24/2010  . TINNITUS, CHRONIC, RIGHT 09/07/2010  . TOOTH LOSS 09/07/2010  . COLONIC POLYPS, HX OF 05/09/2010  . ALLERGIC RHINITIS 12/04/2009  . 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 ADJUSTMENT DISORDER WITH DEPRESSED MOOD 08/30/2009  . GERD 06/12/2009  . KNEE PAIN 06/12/2009  . LIVER MASS 05/30/2009  . CHOLELITHIASIS 05/30/2009  . ESSENTIAL HYPERTENSION, BENIGN 05/05/2009  . 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 ABDOMINAL PAIN 05/05/2009   Home Medication(s) Prior to Admission medications   Medication Sig Start Date End Date Taking? Authorizing Provider  acetaminophen (TYLENOL) 500 MG tablet Take 500 mg by mouth  every 6 (six) hours as needed for moderate pain or headache.    [provider]  carvedilol (COREG) 12.5 MG tablet Take 1 tablet (12.5 mg total) by mouth 2 (two) times daily with a meal. 03/30/19   Baldwin Jamaica, PA-C  chlorthalidone (HYGROTON) 25 MG tablet Take 1 tablet (25 mg total) by mouth daily. 03/11/19 06/09/19  Sueanne Margarita, MD  potassium chloride SA (KLOR-CON) 20 MEQ tablet Take 1 tablet (20 mEq total) by mouth 2 (two) times daily for 10 days. 05/20/19 05/30/19  Fatima Blank, MD  spironolactone (ALDACTONE) 25 MG tablet  Take 1 tablet (25 mg total) by mouth daily. 05/14/19 05/13/20  Sueanne Margarita, MD                                                                                                                                    Past Surgical History Past Surgical History:  Procedure Laterality Date  . ABDOMINAL HYSTERECTOMY    . CESAREAN SECTION    . LEAD REVISION/REPAIR N/A 04/09/2019   Procedure: LEAD REVISION/REPAIR;  Surgeon: Evans Lance, MD;  Location: [facility];  Service: Cardiovascular;  Laterality: N/A;  . PACEMAKER IMPLANT N/A 03/29/2019   Procedure: PACEMAKER IMPLANT;  Surgeon: Evans Lance, MD;  Location: [facility];  Service: Cardiovascular;  Laterality: N/A;   Family History Family History  Problem Relation Age of Onset  . Hypertension Sister   . Hyperlipidemia Sister   . Hypertension Brother   . Hyperlipidemia Brother   . Migraines Daughter   . Hypertension Sister   . Hyperlipidemia Sister   . Cancer Sister   . Hypertension Sister   . Hyperlipidemia Sister   . Hypertension Brother   . Hyperlipidemia Brother   . Hypertension Brother   . Hyperlipidemia Brother   . Hypertension Brother   . Hyperlipidemia Brother   . Hypertension Brother   . Hyperlipidemia Brother   . Diabetes Paternal Grandmother   . Hypertension Paternal Grandmother     Social History Social History   Tobacco Use  . Smoking status: Never Smoker  . Smokeless tobacco: Never Used  Substance Use Topics  . Alcohol use: No    Alcohol/week: 0.0 standard drinks  . Drug use: No   Allergies Patient has no known allergies.  Review of Systems Review of Systems All other systems are reviewed and are negative for acute change except as noted in the HPI  Physical Exam Vital Signs  I have reviewed the triage vital signs BP (!) 125/55   Pulse 61   Temp 98.3 F (36.8 C) (Oral)   Resp 11   SpO2 99%   Physical Exam Vitals signs reviewed.  Constitutional:      General: She is not in  acute distress.    Appearance: She is well-developed. She is not diaphoretic.  HENT:     Head: Normocephalic and atraumatic.  Nose: Nose normal.  Eyes:     General: No scleral icterus.       Right eye: No discharge.        Left eye: No discharge.     Conjunctiva/sclera: Conjunctivae normal.     Pupils: Pupils are equal, round, and reactive to light.  Neck:     Musculoskeletal: Normal range of motion and neck supple.  Cardiovascular:     Rate and Rhythm: Normal rate and regular rhythm.     Heart sounds: No murmur. No friction rub. No gallop.   Pulmonary:     Effort: Pulmonary effort is normal. No respiratory distress.     Breath sounds: Normal breath sounds. No stridor. No rales.  Abdominal:     General: There is no distension.     Palpations: Abdomen is soft.     Tenderness: There is no abdominal tenderness.  Musculoskeletal:        General: No tenderness.  Skin:    General: Skin is warm and dry.     Findings: No erythema or rash.  Neurological:     Mental Status: She is alert and oriented to person, place, and time.     ED Results and Treatments Labs (all labs ordered are listed, but only abnormal results are displayed) Labs Reviewed  BASIC METABOLIC PANEL - Abnormal; Notable for the following components:      Result Value   Potassium 2.6 (*)    Glucose, Bld 102 (*)    All other components within normal limits  CBC WITH DIFFERENTIAL/PLATELET  MAGNESIUM                                                                                                                         EKG  EKG Interpretation  Date/Time:  Thursday May 20 2019 02:31:28 EDT Ventricular Rate:  60 PR Interval:    QRS Duration: 171 QT Interval:  466 QTC Calculation: 466 R Axis:   62 Text Interpretation:  Atrial-paced rhythm Right bundle branch block No significant change since last tracing Confirmed by Addison Lank [phone]) on 05/20/2019 2:39:03 AM      Radiology No results found.   Pertinent labs & imaging results that were available during my care of the patient were reviewed by me and considered in my medical decision making (see chart for details).  Medications Ordered in ED Medications  sodium chloride 0.9 % bolus 500 mL (0 mLs Intravenous Stopped 05/20/19 0252)  potassium chloride 10 mEq in 100 mL IVPB (0 mEq Intravenous Stopped 05/20/19 0413)  potassium chloride SA (KLOR-CON) CR tablet 40 mEq (40 mEq Oral Given 05/20/19 0307)  Procedures Procedures  (including critical care time)  Medical Decision Making / ED Course I have reviewed the nursing notes for this encounter and the patient's prior records (if available in EHR or on provided paperwork).   Rashaun [name] Pain was evaluated in Emergency Department on 05/20/2019 for the symptoms described in the history of present illness. She was evaluated in the context of the global COVID-19 pandemic, which necessitated consideration that the patient might be at risk for infection with the SARS-CoV-2 virus that causes COVID-19. Institutional protocols and algorithms that pertain to the evaluation of patients at risk for COVID-19 are in a state of rapid change based on information released by regulatory bodies including the CDC and federal and state organizations. These policies and algorithms were followed during the patient's care in the ED.  On record review patient is on hydrochlorothiazide and spironolactone.  Intermittent leg pain and headache are suspicious for muscle cramps.  This may be related to electrolyte derangements.   Orthostatics are reassuring.  EKG showed atrial paced rhythm.  No acute changes.  Labs without anemia.  BMP notable for hypokalemia at 2.6, likely the cause of her symptoms.  Patient was repleted with IV potassium and oral K DUR.   Patient was able to stand without  being symptomatic.  Safe for outpatient management.  Will prescribe potassium supplement.  Recommend she follow-up with her PCP or cardiologist to discuss medication adjustment.        Final Clinical Impression(s) / ED Diagnoses Final diagnoses:  Hypokalemia  Palpitations  Muscle spasm    The patient appears reasonably screened and/or stabilized for discharge and I doubt any other medical condition or other [facility] requiring further screening, evaluation, or treatment in the ED at this time prior to discharge.  Disposition: Discharge  Condition: Good  I have discussed the results, Dx and Tx plan with the patient and daughter  who expressed understanding and agree(s) with the plan. Discharge instructions discussed at great length. The patient and daughter was given strict return precautions who verbalized understanding of the instructions. No further questions at time of discharge.    ED Discharge Orders         Ordered    potassium chloride SA (KLOR-CON) 20 MEQ tablet  2 times daily     05/20/19 0501           Follow Up: [facility] [place] [phone]  To discuss need for medication adjustment      This chart was dictated using voice recognition software.  Despite best efforts to proofread,  errors can occur which can change the documentation meaning.   Fatima Blank, MD 05/20/19 [phone]

## 2019-05-24 ENCOUNTER — Other Ambulatory Visit: Payer: Self-pay

## 2019-05-24 ENCOUNTER — Other Ambulatory Visit: Payer: Self-pay | Admitting: *Deleted

## 2019-05-24 DIAGNOSIS — E876 Hypokalemia: Secondary | ICD-10-CM

## 2019-05-25 LAB — BASIC METABOLIC PANEL
BUN/Creatinine Ratio: 17 (ref 12–28)
BUN: 12 mg/dL (ref 8–27)
CO2: 24 mmol/L (ref 20–29)
Calcium: 9.5 mg/dL (ref 8.7–10.3)
Chloride: 102 mmol/L (ref 96–106)
Creatinine, Ser: 0.72 mg/dL (ref 0.57–1.00)
GFR calc Af Amer: 102 mL/min/{1.73_m2} (ref >59)
GFR calc non Af Amer: 88 mL/min/{1.73_m2} (ref >59)
Glucose: 127 mg/dL — ABNORMAL HIGH (ref 65–99)
Potassium: 4.3 mmol/L (ref 3.5–5.2)
Sodium: 140 mmol/L (ref 134–144)

## 2019-05-26 ENCOUNTER — Ambulatory Visit (HOSPITAL_BASED_OUTPATIENT_CLINIC_OR_DEPARTMENT_OTHER): Payer: MEDICAID | Attending: Cardiology | Admitting: Cardiology

## 2019-05-27 ENCOUNTER — Encounter (HOSPITAL_COMMUNITY): Payer: Self-pay

## 2019-05-27 ENCOUNTER — Emergency Department (HOSPITAL_COMMUNITY)
Admission: EM | Admit: 2019-05-27 | Discharge: 2019-05-28 | Disposition: A | Discharge status: Home / Self Care | Payer: Self-pay | Attending: Emergency Medicine | Admitting: Emergency Medicine

## 2019-05-27 DIAGNOSIS — Z950 Presence of cardiac pacemaker: Secondary | ICD-10-CM | POA: Insufficient documentation

## 2019-05-27 DIAGNOSIS — N390 Urinary tract infection, site not specified: Secondary | ICD-10-CM | POA: Insufficient documentation

## 2019-05-27 DIAGNOSIS — R42 Dizziness and giddiness: Secondary | ICD-10-CM | POA: Insufficient documentation

## 2019-05-27 DIAGNOSIS — Z79899 Other long term (current) drug therapy: Secondary | ICD-10-CM | POA: Insufficient documentation

## 2019-05-27 LAB — BASIC METABOLIC PANEL
Anion gap: 11 (ref 5–15)
BUN: 11 mg/dL (ref 8–23)
CO2: 23 mmol/L (ref 22–32)
Calcium: 9.5 mg/dL (ref 8.9–10.3)
Chloride: 103 mmol/L (ref 98–111)
Creatinine, Ser: 0.62 mg/dL (ref 0.44–1.00)
GFR calc Af Amer: >60 mL/min (ref >60)
GFR calc non Af Amer: >60 mL/min (ref >60)
Glucose, Bld: 99 mg/dL (ref 70–99)
Potassium: 4.2 mmol/L (ref 3.5–5.1)
Sodium: 137 mmol/L (ref 135–145)

## 2019-05-27 LAB — URINALYSIS, ROUTINE W REFLEX MICROSCOPIC
Bilirubin Urine: NEGATIVE
Glucose, UA: NEGATIVE mg/dL
Ketones, ur: NEGATIVE mg/dL
Nitrite: NEGATIVE
Protein, ur: NEGATIVE mg/dL
Specific Gravity, Urine: 1.008 (ref 1.005–1.030)
pH: 6 (ref 5.0–8.0)

## 2019-05-27 LAB — CBC
HCT: 42.7 % (ref 36.0–46.0)
Hemoglobin: 14.6 g/dL (ref 12.0–15.0)
MCH: 30.9 pg (ref 26.0–34.0)
MCHC: 34.2 g/dL (ref 30.0–36.0)
MCV: 90.3 fL (ref 80.0–100.0)
Platelets: 228 10*3/uL (ref 150–400)
RBC: 4.73 MIL/uL (ref 3.87–5.11)
RDW: 12.9 % (ref 11.5–15.5)
WBC: 5.9 10*3/uL (ref 4.0–10.5)
nRBC: 0 % (ref 0.0–0.2)

## 2019-05-27 MED ORDER — SODIUM CHLORIDE 0.9% FLUSH: 3.0000 mL | Freq: Once | INTRAVENOUS | Status: DC

## 2019-05-27 NOTE — ED Triage Notes (Signed)
Pt is spanish speaking only. Pt has been having dizziness since last night and having hypertension all day, reports compliance with new meds, pt reports having chills as well but no fevers. Neuro intact bilaterally.

## 2019-05-28 MED ORDER — DIAZEPAM 2 MG PO TABS
2.0000 mg | ORAL_TABLET | Freq: Once | ORAL | Status: AC
  Administered 2019-05-28: 2 mg via ORAL
  Filled 2019-05-28: qty 1

## 2019-05-28 MED ORDER — CEPHALEXIN 500 MG PO CAPS: 500.0000 mg | ORAL_CAPSULE | Freq: Two times a day (BID) | ORAL | 0 refills | Status: AC

## 2019-05-28 MED ORDER — CEPHALEXIN 250 MG PO CAPS
500.0000 mg | ORAL_CAPSULE | Freq: Once | ORAL | Status: AC
  Administered 2019-05-28: 500 mg via ORAL
  Filled 2019-05-28: qty 2

## 2019-05-28 NOTE — ED Provider Notes (Signed)
[facility] EMERGENCY DEPARTMENT Provider Note  CSN: [id number] Arrival date & time: 05/27/19 2004  Chief Complaint(s) Hypertension and Near Syncope  HPI Anita [name] is a 63 y.o. female with a history of sinus node dysfunction status post pacemaker who presents to the emergency department with persistent episodic dizziness.  Patient also reports persistent right leg [name].  Reports feeling intermittently off balance.  She denies any focal weakness or loss of sensation.  I personally saw this patient last week for similar presentation noted that she had hypokalemia related to HCTZ use.  Patient was taken off of HCTZ and prescribed oral potassium supplements.  Patient denies recent fevers or infections.  She did report feeling "not well over the past several days.  No nausea or vomiting.  No abdominal [name].  Endorses mild frequency but no other urinary symptoms.  No other physical complaints.  HPI  Past Medical History Past Medical History:  Diagnosis Date  . ABDOMINAL [name] 05/05/2009   Qualifier: Diagnosis of  By: Jorene Minors, Scott    . Adjustment disorder with depressed mood 08/30/2009   Qualifier: Diagnosis of  By: Jorene Minors, Scott    . ALLERGIC RHINITIS 12/04/2009   Qualifier: Diagnosis of  By: Jorene Minors, Scott    . Allergy   . Aortic stenosis    mild by echo 03/2019  . CHOLELITHIASIS 05/30/2009   Qualifier: Diagnosis of  By: Jorene Minors, Scott    . COLONIC POLYPS, HX OF 05/09/2010   Qualifier: Diagnosis of  By: Jorene Minors, Scott    . Essential hypertension, benign 05/05/2009   Qualifier: Diagnosis of  By: Jorene Minors, Scott    . GERD 06/12/2009   Qualifier: Diagnosis of  By: Jorene Minors, Scott    . Hypertension   . KNEE [name] 06/12/2009   Qualifier: Diagnosis of  By: Jorene Minors, Scott    . LIVER MASS 05/30/2009   Qualifier: Diagnosis of  By: Jorene Minors, Scott    . OBESITY 10/24/2010   Qualifier: Diagnosis of  By: Keenan Bachelor RN, Rip Harbour    . TINNITUS,  CHRONIC, RIGHT 09/07/2010   Qualifier: Diagnosis of  By: Amil Amen MD, Benjamine Mola    . TOOTH LOSS 09/07/2010   Qualifier: Diagnosis of  By: Amil Amen MD, Benjamine Mola    . 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 VARICOSE VEINS, LOWER EXTREMITIES 05/05/2009   Qualifier: Diagnosis of  By: Jorene Minors, Scott     Patient Active Problem List   Diagnosis Date Noted  . Complication associated with cardiac pacemaker lead 04/09/2019  . 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 Syncope 03/27/2019  . Aortic stenosis   . BMI 45.0-49.9, adult ([place]) 10/19/2014  . Venous stasis dermatitis of left lower extremity 10/19/2014  . OBESITY 10/24/2010  . TINNITUS, CHRONIC, RIGHT 09/07/2010  . TOOTH LOSS 09/07/2010  . COLONIC POLYPS, HX OF 05/09/2010  . ALLERGIC RHINITIS 12/04/2009  . 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 ADJUSTMENT DISORDER WITH DEPRESSED MOOD 08/30/2009  . GERD 06/12/2009  . KNEE [name] 06/12/2009  . LIVER MASS 05/30/2009  . CHOLELITHIASIS 05/30/2009  . ESSENTIAL HYPERTENSION, BENIGN 05/05/2009  . 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 ABDOMINAL [name] 05/05/2009   Home Medication(s) Prior to Admission medications   Medication Sig Start Date End Date Taking? Authorizing Provider  acetaminophen (TYLENOL) 500 MG tablet Take 500 mg by mouth every 6 (six) hours as needed for moderate [name] or headache.   Yes  [provider]  carvedilol (COREG) 12.5 MG tablet Take 1 tablet (12.5 mg total) by mouth 2 (two) times daily with a meal. 03/30/19  Yes Baldwin Jamaica, PA-C  potassium chloride SA (KLOR-CON) 20 MEQ tablet Take 1 tablet (20 mEq total) by mouth 2 (two) times daily for 10 days. 05/20/19 05/30/19 Yes Devery Odwyer, Grayce Sessions, MD  spironolactone (ALDACTONE) 25 MG tablet Take 1 tablet (25 mg total) by mouth daily. 05/14/19 05/13/20 Yes Turner, Eber Hong, MD  cephALEXin (KEFLEX) 500 MG capsule Take 1 capsule (500 mg total) by mouth 2 (two)  times daily for 7 days. 05/28/19 06/04/19  Fatima Blank, MD                                                                                                                                    Past Surgical History Past Surgical History:  Procedure Laterality Date  . ABDOMINAL HYSTERECTOMY    . CESAREAN SECTION    . LEAD REVISION/REPAIR N/A 04/09/2019   Procedure: LEAD REVISION/REPAIR;  Surgeon: Evans Lance, MD;  Location: [facility];  Service: Cardiovascular;  Laterality: N/A;  . PACEMAKER IMPLANT N/A 03/29/2019   Procedure: PACEMAKER IMPLANT;  Surgeon: Evans Lance, MD;  Location: [facility];  Service: Cardiovascular;  Laterality: N/A;   Family History Family History  Problem Relation Age of Onset  . Hypertension Sister   . Hyperlipidemia Sister   . Hypertension Brother   . Hyperlipidemia Brother   . Migraines Daughter   . Hypertension Sister   . Hyperlipidemia Sister   . Cancer Sister   . Hypertension Sister   . Hyperlipidemia Sister   . Hypertension Brother   . Hyperlipidemia Brother   . Hypertension Brother   . Hyperlipidemia Brother   . Hypertension Brother   . Hyperlipidemia Brother   . Hypertension Brother   . Hyperlipidemia Brother   . Diabetes Paternal Grandmother   . Hypertension Paternal Grandmother     Social History Social History   Tobacco Use  . Smoking status: Never Smoker  . Smokeless tobacco: Never Used  Substance Use Topics  . Alcohol use: No    Alcohol/week: 0.0 standard drinks  . Drug use: No   Allergies Patient has no known allergies.  Review of Systems Review of Systems All other systems are reviewed and are negative for acute change except as noted in the HPI  Physical Exam Vital Signs  I have reviewed the triage vital signs BP (!) 166/76 (BP Location: Right Arm)   Pulse 77   Temp 98.1 F (36.7 C) (Oral)   Resp 12   SpO2 99%   Physical Exam Vitals signs reviewed.  Constitutional:      General:  She is not in acute distress.    Appearance: She is well-developed. She is not diaphoretic.  HENT:     Head: Normocephalic and atraumatic.  Nose: Nose normal.  Eyes:     General: No scleral icterus.       Right eye: No discharge.        Left eye: No discharge.     Conjunctiva/sclera: Conjunctivae normal.     Pupils: Pupils are equal, round, and reactive to light.  Neck:     Musculoskeletal: Normal range of motion and neck supple.  Cardiovascular:     Rate and Rhythm: Normal rate and regular rhythm.     Heart sounds: No murmur. No friction rub. No gallop.   Pulmonary:     Effort: Pulmonary effort is normal. No respiratory distress.     Breath sounds: Normal breath sounds. No stridor. No rales.  Abdominal:     General: There is no distension.     Palpations: Abdomen is soft.     Tenderness: There is no abdominal tenderness.  Musculoskeletal:        General: No tenderness.  Skin:    General: Skin is warm and dry.     Findings: No erythema or rash.  Neurological:     Mental Status: She is alert and oriented to person, place, and time.     Comments: Mental Status:  Alert and oriented to person, place, and time.  Attention and concentration normal.  Speech clear.  Recent memory is intact  Cranial Nerves:  II Visual Fields: Intact to confrontation. Visual fields intact. III, IV, VI: Pupils equal and reactive to light and near. Full eye movement without nystagmus  V Facial Sensation: Normal. No weakness of masticatory muscles  VII: No facial weakness or asymmetry  VIII Auditory Acuity: Grossly normal  IX/X: The uvula is midline; the palate elevates symmetrically  XI: Normal sternocleidomastoid and trapezius strength  XII: The tongue is midline. No atrophy or fasciculations.   Motor System: Muscle Strength: 5/5 and symmetric in the upper and lower extremities. No pronation or drift.  Muscle Tone: Tone and muscle bulk are normal in the upper and lower extremities.   Reflexes:  DTRs: 1+ and symmetrical in all four extremities. No Clonus Coordination: Intact finger-to-nose, heel-to-shin. No tremor.  Sensation: Intact to light touch. Gait: Routine  gait normal.      ED Results and Treatments Labs (all labs ordered are listed, but only abnormal results are displayed) Labs Reviewed  URINALYSIS, ROUTINE W REFLEX MICROSCOPIC - Abnormal; Notable for the following components:      Result Value   APPearance HAZY (*)    Hgb urine dipstick SMALL (*)    Leukocytes,Ua LARGE (*)    Bacteria, UA RARE (*)    All other components within normal limits  BASIC METABOLIC PANEL  CBC  CBG MONITORING, ED                                                                                                                         EKG  EKG Interpretation  Date/Time:  Friday May 28 2019 04:01:46 EDT Ventricular Rate:  61  PR Interval:    QRS Duration: 144 QT Interval:  456 QTC Calculation: 460 R Axis:   79 Text Interpretation:  Atrial-paced rhythm Right bundle branch block No significant change since last tracing Confirmed by Addison Lank [phone]) on 05/28/2019 6:57:50 AM      Radiology No results found.  Pertinent labs & imaging results that were available during my care of the patient were reviewed by me and considered in my medical decision making (see chart for details).  Medications Ordered in ED Medications  sodium chloride flush (NS) 0.9 % injection 3 mL (has no administration in time range)  diazepam (VALIUM) tablet 2 mg (2 mg Oral Given 05/28/19 0602)  cephALEXin (KEFLEX) capsule 500 mg (500 mg Oral Given 05/28/19 0802)                                                                                                                                    Procedures Procedures  (including critical care time)  Medical Decision Making / ED Course I have reviewed the nursing notes for this encounter and the patient's prior records (if available in EHR or on provided  paperwork).   Anita [name] was evaluated in Emergency Department on 05/28/2019 for the symptoms described in the history of present illness. She was evaluated in the context of the global COVID-19 pandemic, which necessitated consideration that the patient might be at risk for infection with the SARS-CoV-2 virus that causes COVID-19. Institutional protocols and algorithms that pertain to the evaluation of patients at risk for COVID-19 are in a state of rapid change based on information released by regulatory bodies including the CDC and federal and state organizations. These policies and algorithms were followed during the patient's care in the ED.  Exam nonfocal.  Patient currently asymptomatic. Labs reassuring with no electrolyte derangements or renal sufficiency.  No leukocytosis or anemia.  EKG unchanged from prior.  Pacemaker functioning.  UA suspicious for urinary tract infection.  Will treat with Keflex.  Currently pending pacemaker interrogation report to assess for any dysrhythmias.  Patient care turned over to Dr Billy Fischer. Patient case and results discussed in detail; please see their note for further ED managment.            This chart was dictated using voice recognition software.  Despite best efforts to proofread,  errors can occur which can change the documentation meaning.   Fatima Blank, MD 05/28/19 [phone]

## 2019-05-28 NOTE — ED Provider Notes (Signed)
Received care of patient at 7:30 AM from Dr. Leonette Monarch.  Briefly this is a 63 year old female who presents with concerns for episodes of lightheadedness and palpitations.  She had been seen in the emergency department on 7 October diagnosed with hyperkalemia, and hydrochlorothiazide was held.  Labs today show normal potassium, INR significant for urinary tract infection.  Basic interrogation pending.  Reviewed pacemaker interrogation with Dr. Sallyanne Kuster.  Report had mentioned no recent events, but did have 2 episodes of VT October 6.  Dr. Sallyanne Kuster reviewed this, feels most consistent with a very brief episode of atrial tachycardia, and does not recommend any changes at this time.  Discussed results with patient and son using Spanish interpreter.  Gave prescription for Keflex for urinary tract infection, and recommend outpt follow up.   Gareth Morgan, MD 05/29/19 [phone]

## 2019-05-31 NOTE — Progress Notes (Signed)
Patient ID: Anita [name]                 DOB: Nov 05, 1955                      MRN: [id number]     HPI: Anita [name] is a 63 y.o. female referred by Dr. Radford Pax to HTN clinic. PMH is significant for HTN and sinus node dysfunction s/p pacemaker.  Patient was last seen in the HTN clinic on 05/14/19 for uncontrolled HTN. Clinic BP was elevated at 146/84 mmHg and home BP readings ranged from 140-150s/70-80s. Spironolactone 25 mg daily was initiated, and carvedilol 12.5 mg BID and chlorthalidone 25 mg daily were continued. However pt had a recent ED admission on 05/19/19 for sharp shooting foot pain, headache, and dizziness and palpitations upon standing in which she was hypokalemic at 2.6. Pt was repleted with IV potassium and oral KDur, and chlorthalidone was discontinued. K+ currently stable at 4.2 (05/27/19). Pt was also recently admitted on 05/27/19 for episodic lightheadedness with palpitations, and a urinary traction infection - pt was prescribed Keflex 500 mg BID for 7 days. Per PPM, pt had 2 episodes of VT on 10/6 - Dr. Sallyanne Kuster did not recommend any changes at this admission.   Patient presents to clinic today with an interpreter for HTN follow-up. Initial clinic BP was elevated at 180/95 mmHg and another BP was taken towards the end of the visit which remained elevated at 185/91 mmHg - patient complained of feeling hot. Patient reports taking Spironolactone 25 mg daily this morning, however did not take carvedilol before today's clinic visit. At home, patient reports that she stopped taking her evening dose of carvedilol 12.5 mg BID for 4 days because it made her "feel bad, dizzy, head hurts and anxious." During the time she was not taking carvedilol, patient reports she did not feel any better and was unsure of what was making her feel bad. Since then, she restarted carvedilol last night (05/31/19). Patient did not bring home BP readings as advised.  Current HTN meds: Spironolactone 25 mg  daily, carvedilol 12.5 mg BID; reports not using any NSAIDs Previously tried: lisinopril(bilateral arm pain, chills); valsartan(discontinued at hospital discharge due to bilateral arm pain);metoprolol succinateand hydrochlorothiazide (stopped taking ~6 years ago, discontinued byMD), Chlorthalidone 25 mg daily (hypokalemia) BP goal: <130/80 mmHg  Family History:Father - CAD, CABG; Mother - DCM  Social History:never smoker, no alcohol use  Diet:consist of chicken, fish, cheese, "a little salt", no coffee, drinks tea twice a day, does not drink enough water  Exercise:No exercising due to shortness of breath and physcial deconditioning   Wt Readings from Last 3 Encounters:  04/09/19 230 lb (104.3 kg)  04/03/19 230 lb (104.3 kg)  03/28/19 239 lb 1.6 oz (108.5 kg)   BP Readings from Last 3 Encounters:  06/01/19 (!) 185/91  05/28/19 (!) 160/74  05/20/19 (!) 107/51   Pulse Readings from Last 3 Encounters:  06/01/19 63  05/28/19 60  05/20/19 60    Renal function: CrCl cannot be calculated (Unknown ideal weight.).  Past Medical History:  Diagnosis Date   ABDOMINAL PAIN 05/05/2009   Qualifier: Diagnosis of  By: Jorene Minors, Scott     Adjustment disorder with depressed mood 08/30/2009   Qualifier: Diagnosis of  By: Jorene Minors, Scott     ALLERGIC RHINITIS 12/04/2009   Qualifier: Diagnosis of  By: Jorene Minors, Scott     Allergy    Aortic stenosis  mild by echo 03/2019   CHOLELITHIASIS 05/30/2009   Qualifier: Diagnosis of  By: Jorene Minors, Scott     COLONIC POLYPS, HX OF 05/09/2010   Qualifier: Diagnosis of  By: Jorene Minors, Scott     Essential hypertension, benign 05/05/2009   Qualifier: Diagnosis of  By: Jorene Minors, Scott     GERD 06/12/2009   Qualifier: Diagnosis of  By: Jorene Minors, Scott     Hypertension    KNEE PAIN 06/12/2009   Qualifier: Diagnosis of  By: Versie Starks     LIVER MASS 05/30/2009   Qualifier: Diagnosis of  By: Jorene Minors, Scott      OBESITY 10/24/2010   Qualifier: Diagnosis of  By: Keenan Bachelor RN, [place], CHRONIC, RIGHT 09/07/2010   Qualifier: Diagnosis of  By: Amil Amen MD, Talbert Nan LOSS 09/07/2010   Qualifier: Diagnosis of  By: Amil Amen MD, [place] 08/30/2009   Qualifier: Diagnosis of  By: Jorene Minors, Scott     VARICOSE VEINS, LOWER EXTREMITIES 05/05/2009   Qualifier: Diagnosis of  By: Versie Starks      Current Outpatient Medications on File Prior to Visit  Medication Sig Dispense Refill   acetaminophen (TYLENOL) 500 MG tablet Take 500 mg by mouth every 6 (six) hours as needed for moderate pain or headache.     cephALEXin (KEFLEX) 500 MG capsule Take 1 capsule (500 mg total) by mouth 2 (two) times daily for 7 days. 14 capsule 0   potassium chloride SA (KLOR-CON) 20 MEQ tablet Take 1 tablet (20 mEq total) by mouth 2 (two) times daily for 10 days. 20 tablet 0   No current facility-administered medications on file prior to visit.     No Known Allergies   Assessment/Plan:  1. Hypertension - BP was above goal < 130/80 mmHg. Initial clinic BP was elevated at 180/95 mmHg and the second BP check remained elevated at 185/91 mmHg towards the end of the visit. Recommend to increase spironolactone to 50 mg once daily for better BP control management and decrease carvedilol to 6.25 mg twice daily to help alleviate some of the patient reported side-effects. Recommended patient to bring her home BP readings to her next follow-up visit. Encouraged patient to eat a low-carb, low-salt diet and continue to exercise. Called patient's daughter to update her with medication plan as well.  Follow-up in HTN clinic in 10 days. Will recheck BMET at that time.  Lorel Monaco, PharmD PGY1 [facility] [id number] [place], [place] [address] Phone: [phone]; Fax: [phone]

## 2019-06-01 ENCOUNTER — Encounter (INDEPENDENT_AMBULATORY_CARE_PROVIDER_SITE_OTHER): Payer: Self-pay

## 2019-06-01 ENCOUNTER — Other Ambulatory Visit: Payer: Self-pay

## 2019-06-01 ENCOUNTER — Ambulatory Visit (INDEPENDENT_AMBULATORY_CARE_PROVIDER_SITE_OTHER): Payer: Self-pay | Admitting: Pharmacist

## 2019-06-01 VITALS — BP 185/91 | HR 63

## 2019-06-01 DIAGNOSIS — I10 Essential (primary) hypertension: Secondary | ICD-10-CM

## 2019-06-01 MED ORDER — SPIRONOLACTONE 50 MG PO TABS: 50.0000 mg | ORAL_TABLET | Freq: Every day | ORAL | 11 refills | Status: DC

## 2019-06-01 MED ORDER — CARVEDILOL 6.25 MG PO TABS: 6.2500 mg | ORAL_TABLET | Freq: Two times a day (BID) | ORAL | 11 refills | Status: DC

## 2019-06-01 NOTE — Patient Instructions (Addendum)
Nice to see you today!  Keep up the good work with diet and exercise. Aim for a diet full of vegetables, fruit and lean meats (chicken, [place], fish). Try to limit salt intake by eating fresh or frozen vegetables (instead of canned), rinse canned vegetables prior to cooking and do not add any additional salt to meals. Drink plenty of water and limit sugary drinks  Your goal blood pressure is < 130/80 mmHg. In clinic, your blood pressure was elevated at 185/91 mmHg.  Medication Changes: Increase Spironolactone to 50 mg once daily. Right now, take 2 tablets once in the morning of the 25 mg tablets you have at home. Once you have finished the bottle, pick up your new prescription from your pharmacy, Spironolactone 50 mg, you will take 1 tablet once in the morning.  Decrease Carvedilol to 6.25 mg twice daily. Right now, cut your 12.5 mg tablet in half - you will take half of the tablet in the morning and the other half in the evening. Once you have finished the bottle, pick up your new prescription from your pharmacy, Carvedilol 6.25 mg twice daily, you will take 1 tablet in the morning and 1 in the evening.  Monitor blood pressure at home daily and keep a log (on your phone or piece of paper) to bring with you to your next visit. Write down date, time, blood pressure and pulse.  Your next follow-up visit in the HTN clinic will be October 30th, 2020 at 11:30 AM. Please bring your home BP readings with you.  Please give [place] a call at [phone] with any questions or concerns.

## 2019-06-11 ENCOUNTER — Encounter: Payer: Self-pay | Admitting: Pharmacist

## 2019-06-11 ENCOUNTER — Other Ambulatory Visit: Payer: Self-pay

## 2019-06-11 ENCOUNTER — Ambulatory Visit (INDEPENDENT_AMBULATORY_CARE_PROVIDER_SITE_OTHER): Payer: Self-pay | Admitting: Pharmacist

## 2019-06-11 VITALS — BP 170/82 | HR 61

## 2019-06-11 DIAGNOSIS — I10 Essential (primary) hypertension: Secondary | ICD-10-CM

## 2019-06-11 LAB — BASIC METABOLIC PANEL
BUN/Creatinine Ratio: 16 (ref 12–28)
BUN: 14 mg/dL (ref 8–27)
CO2: 21 mmol/L (ref 20–29)
Calcium: 9.5 mg/dL (ref 8.7–10.3)
Chloride: 103 mmol/L (ref 96–106)
Creatinine, Ser: 0.85 mg/dL (ref 0.57–1.00)
GFR calc Af Amer: 83 mL/min/{1.73_m2} (ref >59)
GFR calc non Af Amer: 72 mL/min/{1.73_m2} (ref >59)
Glucose: 103 mg/dL — ABNORMAL HIGH (ref 65–99)
Potassium: 4.5 mmol/L (ref 3.5–5.2)
Sodium: 138 mmol/L (ref 134–144)

## 2019-06-11 MED ORDER — AMLODIPINE BESYLATE 5 MG PO TABS: 5.0000 mg | ORAL_TABLET | Freq: Every day | ORAL | 11 refills | Status: DC

## 2019-06-11 NOTE — Patient Instructions (Addendum)
It was nice to see you today!  Please start taking amlodipine 5mg  daily Continue taking spironolactone 50 mg daily and carvedilol 6.25 mg twice a day. You will go to the lab today after your visit. I will call your daughter with your results.  Increase your walks around the neighborhood to 20 min each time  We will see you back in clinic in 3 weeks  Call us at [phone] with any questions or concerns.

## 2019-06-11 NOTE — Progress Notes (Signed)
Patient ID: Anita [name]                 DOB: Oct 11, 1955                      MRN: [id number]     HPI: Anita [name] is a 63 y.o. female referred by Dr. Radford Pax to HTN clinic. PMH is significant for HTN and sinus node dysfunction s/p pacemaker.  Patient was seen in the HTN clinic on 05/14/19 for uncontrolled HTN. Clinic BP was elevated at 146/84 mmHg and home BP readings ranged from 140-150s/70-80s. Spironolactone 25 mg daily was initiated, and carvedilol 12.5 mg BID and chlorthalidone 25 mg daily were continued. However pt had a recent ED admission on 05/19/19 for sharp shooting foot pain, headache, and dizziness and palpitations upon standing in which she was hypokalemic at 2.6. Pt was repleted with IV potassium and oral KDur, and chlorthalidone was discontinued. K+ currently stable at 4.2 (05/27/19). Pt was also recently admitted on 05/27/19 for episodic lightheadedness with palpitations, and a urinary traction infection - pt was prescribed Keflex 500 mg BID for 7 days. Per PPM, pt had 2 episodes of VT on 10/6 - Dr. Sallyanne Kuster did not recommend any changes at this admission.   At last HTN visit BP was elevated at 180/95 mmHg and another BP was taken towards the end of the visit which remained elevated at 185/91 mmHg. Carvedilol was decreased to 6.25mg  twice a day due to reported side effects and spironolactone was increased to 50mg  daily.  Patient presents to clinic today with an interpreter for HTN follow-up. She denies dizziness, lightheadedness or swelling. She states she gets "spots in her eyes" and warmness in neck when she is stress out. Also reports ringing in her ear. When asked if anything triggers this stress, she denies any triggers. She says that sometimes she feels so poorly she has to lye down. When asked if she feels poorly after taking her carvedilol she says no. Reports loosness in her knees. Thinks the medicine makes her feel poorly. States no improvement in symptoms since  decreasing carvedilol. States she had a mild headache yesterday.   BP in clinic was elevated at 170/82. She took medications at 9:00 AM. Appointment at 11:30. Blood pressure checked again and remained the same.  Current HTN meds: Spironolactone 50 mg daily, carvedilol 6.25 mg BID; reports not using any NSAIDs Previously tried: lisinopril(bilateral arm pain, chills); valsartan(discontinued at hospital discharge due to bilateral arm pain);metoprolol succinateand hydrochlorothiazide (stopped taking ~6 years ago, discontinued byMD), Chlorthalidone 25 mg daily (hypokalemia) BP goal: <130/80 mmHg  Family History:Father - CAD, CABG; Mother - DCM  Social History:never smoker, no alcohol use  Diet:consist of chicken, fish, cheese, "a little salt", no coffee, drinks tea twice a day, does not drink enough water  Exercise:No exercising due to shortness of breath and physcial deconditioning   Wt Readings from Last 3 Encounters:  04/09/19 230 lb (104.3 kg)  04/03/19 230 lb (104.3 kg)  03/28/19 239 lb 1.6 oz (108.5 kg)   BP Readings from Last 3 Encounters:  06/01/19 (!) 185/91  05/28/19 (!) 160/74  05/20/19 (!) 107/51   Pulse Readings from Last 3 Encounters:  06/01/19 63  05/28/19 60  05/20/19 60    Renal function: CrCl cannot be calculated (Unknown ideal weight.).  Past Medical History:  Diagnosis Date  . ABDOMINAL PAIN 05/05/2009   Qualifier: Diagnosis of  By: Jorene Minors, Scott    . Adjustment disorder  with depressed mood 08/30/2009   Qualifier: Diagnosis of  By: Jorene Minors, Scott    . ALLERGIC RHINITIS 12/04/2009   Qualifier: Diagnosis of  By: Jorene Minors, Scott    . Allergy   . Aortic stenosis    mild by echo 03/2019  . CHOLELITHIASIS 05/30/2009   Qualifier: Diagnosis of  By: Jorene Minors, Scott    . COLONIC POLYPS, HX OF 05/09/2010   Qualifier: Diagnosis of  By: Jorene Minors, Scott    . Essential hypertension, benign 05/05/2009   Qualifier: Diagnosis of  By: Jorene Minors, Scott    . GERD 06/12/2009   Qualifier: Diagnosis of  By: Jorene Minors, Scott    . Hypertension   . KNEE PAIN 06/12/2009   Qualifier: Diagnosis of  By: Jorene Minors, Scott    . LIVER MASS 05/30/2009   Qualifier: Diagnosis of  By: Jorene Minors, Scott    . OBESITY 10/24/2010   Qualifier: Diagnosis of  By: Keenan Bachelor RN, Rip Harbour    . TINNITUS, CHRONIC, RIGHT 09/07/2010   Qualifier: Diagnosis of  By: Amil Amen MD, Benjamine Mola    . TOOTH LOSS 09/07/2010   Qualifier: Diagnosis of  By: Amil Amen MD, Benjamine Mola    . 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 VARICOSE VEINS, LOWER EXTREMITIES 05/05/2009   Qualifier: Diagnosis of  By: Versie Starks      Current Outpatient Medications on File Prior to Visit  Medication Sig Dispense Refill  . acetaminophen (TYLENOL) 500 MG tablet Take 500 mg by mouth every 6 (six) hours as needed for moderate pain or headache.    . carvedilol (COREG) 6.25 MG tablet Take 1 tablet (6.25 mg total) by mouth 2 (two) times daily with a meal. 60 tablet 11  . spironolactone (ALDACTONE) 50 MG tablet Take 1 tablet (50 mg total) by mouth daily. 30 tablet 11   No current facility-administered medications on file prior to visit.     No Known Allergies   Assessment/Plan:  1. Hypertension -Called and left voice for patient's daughter to update her with medication plan as well. Blood pressure is above goal of <130/80. Will add amlodipine 5mg  daily. Educated to take at same time everyday. Patient concerned that more medicine will make her feel worse. Explained that we need to bring her blood pressure down to help her feel better. Also advised that her symptoms may not be medication related. Also advised she should see an eye doctor for the "spots" she sees. However, patient does not have insurance. There is a small chance this is a side effect of her carvedilol along with her overall feeling "poorly." I have discussed with Dr. Lovena Le if she  has a compelling indication from an EP standpoint for a beta blocker. He advised that it would be fine to stop if we desire. Can consider changing at next appointment. Discussed with patient increasing her exercise to 20 min walks most days of the week and to stop using table salt when she cooks and at the table. Follow-up in HTN clinic in 3 weeks.  Ramond Dial, Pharm.D, [place]  [id number], [place],  [address]  Phone: [phone]; Fax: [phone]

## 2019-06-14 ENCOUNTER — Ambulatory Visit: Payer: Self-pay | Admitting: Nurse Practitioner

## 2019-06-14 ENCOUNTER — Telehealth: Payer: Self-pay

## 2019-06-14 NOTE — Telephone Encounter (Signed)
-----   Message from Sueanne Margarita, MD sent at 06/11/2019  5:01 PM EDT ----- Please let patient know that labs were normal.  Continue current medical therapy.

## 2019-06-14 NOTE — Telephone Encounter (Signed)
Notes recorded by Frederik Schmidt, RN on 06/14/2019 at 9:20 AM EST  The patient's daughter has been notified of the result and verbalized understanding. All questions (if any) were answered.  Frederik Schmidt, RN 06/14/2019 9:20 AM

## 2019-06-25 ENCOUNTER — Other Ambulatory Visit: Payer: Self-pay

## 2019-06-25 ENCOUNTER — Encounter: Payer: Self-pay | Admitting: Nurse Practitioner

## 2019-06-25 ENCOUNTER — Ambulatory Visit: Payer: Self-pay | Attending: Nurse Practitioner | Admitting: Nurse Practitioner

## 2019-06-25 DIAGNOSIS — I10 Essential (primary) hypertension: Secondary | ICD-10-CM

## 2019-06-25 DIAGNOSIS — F329 Major depressive disorder, single episode, unspecified: Secondary | ICD-10-CM

## 2019-06-25 DIAGNOSIS — Z7689 Persons encountering health services in other specified circumstances: Secondary | ICD-10-CM

## 2019-06-25 MED ORDER — ESCITALOPRAM OXALATE 10 MG PO TABS: 10.0000 mg | ORAL_TABLET | Freq: Every day | ORAL | 1 refills | Status: DC

## 2019-06-25 MED ORDER — AMLODIPINE BESYLATE 10 MG PO TABS: 10.0000 mg | ORAL_TABLET | Freq: Every day | ORAL | 0 refills | Status: DC

## 2019-06-25 NOTE — Progress Notes (Signed)
Virtual Visit via Telephone Note Due to national recommendations of social distancing due to Anita [name] 19, telehealth visit is felt to be most appropriate for this patient at this time.  I discussed the limitations, risks, security and privacy concerns of performing an evaluation and management service by telephone and the availability of in person appointments. I also discussed with the patient that there may be a patient responsible charge related to this service. The patient expressed understanding and agreed to proceed.    I connected with Anita [name] on 06/25/19  at  10:30 AM EST  EDT by telephone and verified that I am speaking with the correct person using two identifiers.   Consent I discussed the limitations, risks, security and privacy concerns of performing an evaluation and management service by telephone and the availability of in person appointments. I also discussed with the patient that there may be a patient responsible charge related to this service. The patient expressed understanding and agreed to proceed.   Location of Patient: Private Residence   Location of Provider: [facility] participating in Telemedicine visit: Geryl Rankins FNP-BC [place] Spanish Interpreter ID [phone]   History of Present Illness: Telemedicine visit for: Establish Care  has a past medical history of ABDOMINAL PAIN (05/05/2009), Adjustment disorder with depressed mood (08/30/2009), ALLERGIC RHINITIS (12/04/2009), Allergy, Aortic stenosis, CHOLELITHIASIS (05/30/2009), COLONIC POLYPS, HX OF (05/09/2010), Essential hypertension, benign (05/05/2009), GERD (06/12/2009), Hypertension, KNEE PAIN (06/12/2009), LIVER MASS (05/30/2009), OBESITY (10/24/2010), TINNITUS, CHRONIC, RIGHT (09/07/2010), TOOTH LOSS (09/07/2010), TRICHOMONAL VAGINITIS (08/30/2009), and VARICOSE VEINS, LOWER EXTREMITIES (05/05/2009).  She is currently seeing a cardiologist  for uncontrolled HTN. She has a history of sinus node dysfunction s/p pacemaker.  She has not been to a primary care provider in many years. Last mammogram, pap spear and colonoscopy was 9 years ago. S/P hysterectomy. She is uninsured. Patient has been advised to apply for financial assistance and schedule to see our financial counselor. Referred to the breast clinic today for mammogram scheduling. Will need home FIT test at next office visit   Essential Hypertension She monitors her blood pressure at home. She was started on amlodipine 5 mg on October 30th. Blood pressure is still elevated. Will increase to 10 mg today. She was instructed to take 2 tablets of 5mg  at this time as she still has the 5 mgs at home. She has a follow up appointment in 3 weeks at the HTN clinic. Her most recent blood pressure reading at home was 166/90. She states she is afraid to check her blood pressure too much as she gets stressed out and it makes her blood pressure even higher. Denies any current chest pain, shortness of breath, palpitations, lightheadedness, dizziness, headaches or BLE edema.  Chlorthalidone dc'd due to hypokalemia.  BP Readings from Last 3 Encounters:  06/11/19 (!) 170/82  06/01/19 (!) 185/91  05/28/19 (!) 160/74   Depression Onset 2-3 months ago after she got her pacemaker. She is agreeable to starting an SSRI today. Has never taken antidepressants in the past.  Depression screen [facility] 2/9 06/25/2019  Decreased Interest 0  Down, Depressed, Hopeless 3  PHQ - 2 Score 3  Altered sleeping 2  Tired, decreased energy 1  Change in appetite 1  Feeling bad or failure about yourself  0  Trouble concentrating 3  Moving slowly or fidgety/restless 0  Suicidal thoughts 0  PHQ-9 Score 10     Past Medical History:  Diagnosis Date  . ABDOMINAL PAIN 05/05/2009   Qualifier: Diagnosis of  By: Jorene Minors, Scott    . Adjustment disorder with depressed mood 08/30/2009   Qualifier: Diagnosis of  By: Jorene Minors, Scott    . ALLERGIC RHINITIS 12/04/2009   Qualifier: Diagnosis of  By: Jorene Minors, Scott    . Allergy   . Aortic stenosis    mild by echo 03/2019  . CHOLELITHIASIS 05/30/2009   Qualifier: Diagnosis of  By: Jorene Minors, Scott    . COLONIC POLYPS, HX OF 05/09/2010   Qualifier: Diagnosis of  By: Jorene Minors, Scott    . Essential hypertension, benign 05/05/2009   Qualifier: Diagnosis of  By: Jorene Minors, Scott    . GERD 06/12/2009   Qualifier: Diagnosis of  By: Jorene Minors, Scott    . Hypertension   . KNEE PAIN 06/12/2009   Qualifier: Diagnosis of  By: Jorene Minors, Scott    . LIVER MASS 05/30/2009   Qualifier: Diagnosis of  By: Jorene Minors, Scott    . OBESITY 10/24/2010   Qualifier: Diagnosis of  By: Keenan Bachelor RN, Rip Harbour    . TINNITUS, CHRONIC, RIGHT 09/07/2010   Qualifier: Diagnosis of  By: Amil Amen MD, Benjamine Mola    . TOOTH LOSS 09/07/2010   Qualifier: Diagnosis of  By: Amil Amen MD, Benjamine Mola    . 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 VARICOSE VEINS, LOWER EXTREMITIES 05/05/2009   Qualifier: Diagnosis of  By: Versie Starks      Past Surgical History:  Procedure Laterality Date  . ABDOMINAL HYSTERECTOMY    . CESAREAN SECTION    . LEAD REVISION/REPAIR N/A 04/09/2019   Procedure: LEAD REVISION/REPAIR;  Surgeon: Evans Lance, MD;  Location: [facility];  Service: Cardiovascular;  Laterality: N/A;  . PACEMAKER IMPLANT N/A 03/29/2019   Procedure: PACEMAKER IMPLANT;  Surgeon: Evans Lance, MD;  Location: [facility];  Service: Cardiovascular;  Laterality: N/A;    Family History  Problem Relation Age of Onset  . Hypertension Sister   . Hyperlipidemia Sister   . Hypertension Brother   . Hyperlipidemia Brother   . Migraines Daughter   . Hypertension Sister   . Hyperlipidemia Sister   . Cancer Sister   . Hypertension Sister   . Hyperlipidemia Sister   . Hypertension Brother   . Hyperlipidemia Brother   .  Hypertension Brother   . Hyperlipidemia Brother   . Hypertension Brother   . Hyperlipidemia Brother   . Hypertension Brother   . Hyperlipidemia Brother   . Diabetes Paternal Grandmother   . Hypertension Paternal Grandmother     Social History   Socioeconomic History  . Marital status: Married    Spouse name: Freida Busman  . Number of children: 5  . Years of education: 6th grade  . Highest education level: Not on file  Occupational History  . Occupation: homemaker  Social Needs  . Financial resource strain: Not on file  . Food insecurity    Worry: Not on file    Inability: Not on file  . Transportation needs    Medical: Not on file    Non-medical: Not on file  Tobacco Use  . Smoking status: Never Smoker  . Smokeless tobacco: Never Used  Substance and Sexual Activity  . Alcohol use: No    Alcohol/week: 0.0 standard drinks  . Drug use: No  . Sexual activity: Not Currently  Lifestyle  .  Physical activity    Days per week: Not on file    Minutes per session: Not on file  . Stress: Not on file  Relationships  . Social Herbalist on phone: Not on file    Gets together: Not on file    Attends religious service: Not on file    Active member of club or organization: Not on file    Attends meetings of clubs or organizations: Not on file    Relationship status: Not on file  Other Topics Concern  . Not on file  Social History Narrative   From [place], [place]. Came to the [place] 2002. Lives with her husband. Their son and grandson live nearby.     Observations/Objective: Awake, alert and oriented x 3   Review of Systems  Constitutional: Negative for fever, malaise/fatigue and weight loss.  HENT: Negative.  Negative for nosebleeds.   Eyes: Negative.  Negative for blurred vision, double vision and photophobia.  Respiratory: Negative.  Negative for cough and shortness of breath.   Cardiovascular: Negative.  Negative for chest pain, palpitations and leg swelling.   Gastrointestinal: Positive for heartburn. Negative for nausea and vomiting.  Musculoskeletal: Negative.  Negative for myalgias.  Neurological: Negative.  Negative for dizziness, focal weakness, seizures and headaches.  Psychiatric/Behavioral: Negative.  Negative for suicidal ideas.    Assessment and Plan: Aniella was seen today for new patient (initial visit).  Diagnoses and all orders for this visit:  Encounter to establish care  Essential hypertension  Reactive depression -     escitalopram (LEXAPRO) 10 MG tablet; Take 1 tablet (10 mg total) by mouth daily.     Follow Up Instructions Return in about 4 weeks (around 07/23/2019) for depression.     I discussed the assessment and treatment plan with the patient. The patient was provided an opportunity to ask questions and all were answered. The patient agreed with the plan and demonstrated an understanding of the instructions.   The patient was advised to call back or seek an in-person evaluation if the symptoms worsen or if the condition fails to improve as anticipated.  I provided 21 minutes of non-face-to-face time during this encounter including median intraservice time, reviewing previous notes, labs, imaging, medications and explaining diagnosis and management.  Gildardo Pounds, FNP-BC

## 2019-06-30 NOTE — Progress Notes (Signed)
Patient ID: Anita [name]                 DOB: 1956/04/21                      MRN: [id number]     HPI: Anita [name] is a 63 y.o. female eferred by [facility].PMH is significant for HTN and sinus node dysfunction s/p pacemaker. Patient is Spanish speaking and needs interpreter. She is also uninsured and does not have a PCP, but sees Dr. Radford Pax.   Patient was seen in the HTN clinic on 05/14/19 for uncontrolled HTN. Clinic BP was elevated at 146/84 mmHg and home BP readings ranged from 140-150s/70-80s. Spironolactone 25 mg daily was initiated, and carvedilol 12.5 mg BID and chlorthalidone 25 mg daily were continued. However pt had a recent ED admission on 05/19/19 for sharp shooting foot pain, headache, and dizziness and palpitations upon standing in which she was hypokalemic at 2.6. Pt was repleted with IV potassium and oral KDur, and chlorthalidone was discontinued. K+ currently stable at 4.2 (05/27/19). Pt was also recently admitted on 05/27/19 for episodic lightheadedness with palpitations, and a urinary traction infection - pt was prescribed Keflex 500 mg BID for 7 days. Per PPM, pt had 2 episodes of VT on 10/6 - Anita [name] did not recommend any changes at this admission. She was seen at the HTN clinicon 06/01/19, where BP was elevated at 180/95 initially, and 185/91 on recheck. Carvedilol was decreased to 6.25 mg twice a day due to reported side effects and spironolactone was increased to 50 mg daily.  At last HTN visit (06/11/19) her BP was elevated at 170/82 initially, and the same on recheck. She reported getting "spots in her eyes", warmness in neck, and ringing in ears when stressed out, sometimes requiring her to lay down. She felt this might be due to her medications, but stated no improvement in symptoms since decreasing carvedilol. Amlodipine 5 mg daily was added. During a virtual visit with Anita [name] (NP) on 06/25/19, her BP was noted to still be elevated with a  recent home reading of 166/90, thus amlodipine was increased to 10 mg daily. Patient stated she is afraid to check her BP at home too frequently as it stresses her out and makes her BP even higher.   Today, patient presents to clinic feeling bad and nervous, overall just not feeling great. She reports tolerating amlodipine well without side effects, however, she is currently only taking 5 mg daily as she was nervous about increasing to 10 mg as advised. There was some confusion as to which medications she was taking and at which doses, but upon further clarification she appears to be taking her other medications as prescribed. She denies any swelling, SOB, dizziness, or chest pain. We discussed her past ACE/ARB use, and patient stated she felt a heat/burning and pain in her arms as soon as she started the valsartan, and it resolved after stopping it. However, she also thought she might be having a bit of that now which she attributed to her carvedilol. Her BP in clinic was initially 152/82, and 148/78 on recheck. She does not currently check her BP at home as it still stresses her out. 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    Current HTN meds:  Spironolactone 50 mg daily,  Carvedilol 6.25 mg BID, Amlodipine 5 mg  daily (only taking 5 mg a day currently)   Previously tried:  lisinopril(bilateral arm pain, chills), valsartan(discontinued at hospital dischargedue to bilateral arm pain)  metoprolol succinateand hydrochlorothiazide (stopped taking ~6 years ago, discontinued byMD) Chlorthalidone 25 mg daily (hypokalemia)  BP goal: <130/80 mmHg  Family History:Father - CAD, CABG; Mother - DCM  Social History:never smoker, no alcohol use  Diet:consist of chicken, fish, cheese, "a little salt", no coffee, drinks tea twice a day, does not drink enough water  Exercise:No exercising due  to shortness of breath and physcial deconditioning  Home BP readings: checks infrequently   Wt Readings from Last 3 Encounters:  04/09/19 230 lb (104.3 kg)  04/03/19 230 lb (104.3 kg)  03/28/19 239 lb 1.6 oz (108.5 kg)   BP Readings from Last 3 Encounters:  06/11/19 (!) 170/82  06/01/19 (!) 185/91  05/28/19 (!) 160/74   Pulse Readings from Last 3 Encounters:  06/11/19 61  06/01/19 63  05/28/19 60    Renal function: CrCl cannot be calculated (Unknown ideal weight.).  Past Medical History:  Diagnosis Date   ABDOMINAL PAIN 05/05/2009   Qualifier: Diagnosis of  By: Jorene Minors, Scott     Adjustment disorder with depressed mood 08/30/2009   Qualifier: Diagnosis of  By: Jorene Minors, Scott     ALLERGIC RHINITIS 12/04/2009   Qualifier: Diagnosis of  By: Jorene Minors, Scott     Allergy    Aortic stenosis    mild by echo 03/2019   CHOLELITHIASIS 05/30/2009   Qualifier: Diagnosis of  By: Jorene Minors, Scott     COLONIC POLYPS, HX OF 05/09/2010   Qualifier: Diagnosis of  By: Jorene Minors, Scott     Essential hypertension, benign 05/05/2009   Qualifier: Diagnosis of  By: Jorene Minors, Scott     GERD 06/12/2009   Qualifier: Diagnosis of  By: Jorene Minors, Scott     Hypertension    KNEE PAIN 06/12/2009   Qualifier: Diagnosis of  By: Versie Starks     LIVER MASS 05/30/2009   Qualifier: Diagnosis of  By: Jorene Minors, Scott     OBESITY 10/24/2010   Qualifier: Diagnosis of  By: Keenan Bachelor RN, [place], CHRONIC, RIGHT 09/07/2010   Qualifier: Diagnosis of  By: Amil Amen MD, [place] Nan LOSS 09/07/2010   Qualifier: Diagnosis of  By: Amil Amen MD, [place] 08/30/2009   Qualifier: Diagnosis of  By: Jorene Minors, Scott     VARICOSE VEINS, LOWER EXTREMITIES 05/05/2009   Qualifier: Diagnosis of  By: Versie Starks      Current Outpatient Medications on File Prior to Visit  Medication Sig Dispense Refill   acetaminophen (TYLENOL) 500  MG tablet Take 500 mg by mouth every 6 (six) hours as needed for moderate pain or headache.     amLODipine (NORVASC) 10 MG tablet Take 1 tablet (10 mg total) by mouth daily. 30 tablet 0   carvedilol (COREG) 6.25 MG tablet Take 1 tablet (6.25 mg total) by mouth 2 (two) times daily with a meal. 60 tablet 11   escitalopram (LEXAPRO) 10 MG tablet Take 1 tablet (10 mg total) by mouth daily. 30 tablet 1   spironolactone (ALDACTONE) 50 MG tablet Take 1 tablet (50 mg total) by mouth daily. 30 tablet 11   No current facility-administered medications on file prior to visit.     No Known Allergies   Assessment/Plan:  1. Hypertension - Patient's BP remains  elevated above goal of <130/80, warranting further medication optimization. Stop carvedilol 6.25 mg BID as this may still be causing some of her discomfort, including occasional "spots in her eyes". Increase amlodipine to 10 mg daily as previously advised, for added blood pressure lowering. Continue spironolactone 50 mg daily. Patient advised to try checking her home blood pressure if she can, and to continue with improving her diet and exercise regimen. She will begin having follow-up appointments at the [facility] office, to enable her to see a spanish speaking pharmacist which may be easier and more beneficial for the patient.   Claudina Lick, PharmD Candidate [phone] [place] [id number], [place] [address] Phone: [phone]; Fax: [phone]  [place], Pharm.D, BCPS, CPP [place]  [id number], [place], [place] [address]  Phone: [phone]; Fax: [phone]

## 2019-07-01 ENCOUNTER — Other Ambulatory Visit: Payer: Self-pay

## 2019-07-01 ENCOUNTER — Telehealth: Payer: Self-pay | Admitting: Pharmacist

## 2019-07-01 ENCOUNTER — Ambulatory Visit (INDEPENDENT_AMBULATORY_CARE_PROVIDER_SITE_OTHER): Payer: Self-pay | Admitting: Pharmacist

## 2019-07-01 VITALS — BP 148/78 | HR 75

## 2019-07-01 DIAGNOSIS — I10 Essential (primary) hypertension: Secondary | ICD-10-CM

## 2019-07-01 NOTE — Patient Instructions (Addendum)
It was nice to see you today!  Your goal blood pressure is <130/80 mmHg. Your blood pressure today was 148/78.  Medications: Stop taking carvedilol 6.25 mg twice daily.  Increase amlodipine to 10mg  daily (1 whole tablet).  Continue taking spironolactone 50mg  daily.   You will see Dr. Roxanne Mins, a [place] speaking pharmacist, at our [facility] office for your next appointment, and she will call you a few days before your appointment.  We will call you with options for scheduling an appointment there in December.   Keep up the good work with your diet and exercise!  Please check your blood pressure at home as you are able, and write down your results.    Call us at [phone] with any questions or concerns

## 2019-07-01 NOTE — Telephone Encounter (Signed)
Daughter made aware that appointment with Raquel, pharmacist at the [place] office who speaks fluent spanish, was made for Dec 18 at 1:00. Per daughter, Verdis Frederickson, this time and date is fine and she knows how to get to that office

## 2019-07-06 ENCOUNTER — Encounter: Payer: Self-pay | Admitting: Internal Medicine

## 2019-07-12 ENCOUNTER — Ambulatory Visit (INDEPENDENT_AMBULATORY_CARE_PROVIDER_SITE_OTHER): Payer: Self-pay | Admitting: Internal Medicine

## 2019-07-12 ENCOUNTER — Other Ambulatory Visit: Payer: Self-pay

## 2019-07-12 ENCOUNTER — Encounter: Payer: Self-pay | Admitting: Internal Medicine

## 2019-07-12 VITALS — BP 118/68 | HR 80 | Ht 64.0 in | Wt 223.4 lb

## 2019-07-12 DIAGNOSIS — R55 Syncope and collapse: Secondary | ICD-10-CM

## 2019-07-12 DIAGNOSIS — T82110S Breakdown (mechanical) of cardiac electrode, sequela: Secondary | ICD-10-CM

## 2019-07-12 DIAGNOSIS — Z950 Presence of cardiac pacemaker: Secondary | ICD-10-CM

## 2019-07-12 DIAGNOSIS — I495 Sick sinus syndrome: Secondary | ICD-10-CM | POA: Insufficient documentation

## 2019-07-12 NOTE — Progress Notes (Signed)
HPI [name] returns today for followup. She is a h/o sinus node dysfunction, s/p PPM insertion. She had lead dislodgement and underwent revision. In the interim, she has done well. SHe has non-exertional arm [name]. No chest [name]. No sob.  No Known Allergies   Current Outpatient Medications  Medication Sig Dispense Refill  . acetaminophen (TYLENOL) 500 MG tablet Take 500 mg by mouth every 6 (six) hours as needed for moderate [name] or headache.    Marland Kitchen amLODipine (NORVASC) 10 MG tablet Take 1 tablet (10 mg total) by mouth daily. 30 tablet 0  . spironolactone (ALDACTONE) 50 MG tablet Take 1 tablet (50 mg total) by mouth daily. 30 tablet 11   No current facility-administered medications for this visit.      Past Medical History:  Diagnosis Date  . ABDOMINAL [name] 05/05/2009   Qualifier: Diagnosis of  By: Anita [name], Anita [name]    . Adjustment disorder with depressed mood 08/30/2009   Qualifier: Diagnosis of  By: Anita [name], Anita [name]    . ALLERGIC RHINITIS 12/04/2009   Qualifier: Diagnosis of  By: Anita [name], Anita [name]    . Allergy   . Aortic stenosis    mild by echo 03/2019  . CHOLELITHIASIS 05/30/2009   Qualifier: Diagnosis of  By: Anita [name], Anita [name]    . COLONIC POLYPS, HX OF 05/09/2010   Qualifier: Diagnosis of  By: Anita [name], Anita [name]    . Essential hypertension, benign 05/05/2009   Qualifier: Diagnosis of  By: Anita [name], Anita [name]    . GERD 06/12/2009   Qualifier: Diagnosis of  By: Anita [name], Anita [name]    . Hypertension   . KNEE [name] 06/12/2009   Qualifier: Diagnosis of  By: Anita [name], Anita [name]    . LIVER MASS 05/30/2009   Qualifier: Diagnosis of  By: Anita [name], Anita [name]    . OBESITY 10/24/2010   Qualifier: Diagnosis of  By: Anita Bachelor RN, Anita [name]    . TINNITUS, CHRONIC, RIGHT 09/07/2010   Qualifier: Diagnosis of  By: Anita Amen MD, Anita [name]    . TOOTH LOSS 09/07/2010   Qualifier: Diagnosis of  By: Anita Amen MD, Anita [name]    . 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   Qualifier: Diagnosis of  By:  Anita [name], Anita [name]    . VARICOSE VEINS, LOWER EXTREMITIES 05/05/2009   Qualifier: Diagnosis of  By: Anita [name], Anita [name]      ROS:   All systems reviewed and negative except as noted in the HPI.   Past Surgical History:  Procedure Laterality Date  . ABDOMINAL HYSTERECTOMY    . CESAREAN SECTION    . LEAD REVISION/REPAIR N/A 04/09/2019   Procedure: LEAD REVISION/REPAIR;  Surgeon: Anita Lance, MD;  Location: [place] [facility];  Service: Cardiovascular;  Laterality: N/A;  . PACEMAKER IMPLANT N/A 03/29/2019   Procedure: PACEMAKER IMPLANT;  Surgeon: Anita Lance, MD;  Location: [place] [facility];  Service: Cardiovascular;  Laterality: N/A;     Family History  Problem Relation Age of Onset  . Hypertension Sister   . Hyperlipidemia Sister   . Hypertension Brother   . Hyperlipidemia Brother   . Migraines Daughter   . Hypertension Sister   . Hyperlipidemia Sister   . Cancer Sister   . Hypertension Sister   . Hyperlipidemia Sister   . Hypertension Brother   . Hyperlipidemia Brother   . Hypertension Brother   . Hyperlipidemia Brother   . Hypertension Brother   . Hyperlipidemia Brother   . Hypertension Brother   .  Hyperlipidemia Brother   . Diabetes Paternal Grandmother   . Hypertension Paternal Grandmother      Social History   Socioeconomic History  . Marital status: Married    Spouse name: Anita [name]  . Number of children: 5  . Years of education: 6th grade  . Highest education level: Not on file  Occupational History  . Occupation: homemaker  Social Needs  . Financial resource strain: Not on file  . Food insecurity    Worry: Not on file    Inability: Not on file  . Transportation needs    Medical: Not on file    Non-medical: Not on file  Tobacco Use  . Smoking status: Never Smoker  . Smokeless tobacco: Never Used  Substance and Sexual Activity  . Alcohol use: No    Alcohol/week: 0.0 standard drinks  . Drug use: No  . Sexual activity: Not Currently   Lifestyle  . Physical activity    Days per week: Not on file    Minutes per session: Not on file  . Stress: Not on file  Relationships  . Social Herbalist on phone: Not on file    Gets together: Not on file    Attends religious service: Not on file    Active member of club or organization: Not on file    Attends meetings of clubs or organizations: Not on file    Relationship status: Not on file  . Intimate partner violence    Fear of current or ex partner: Not on file    Emotionally abused: Not on file    Physically abused: Not on file    Forced sexual activity: Not on file  Other Topics Concern  . Not on file  Social History Narrative   From [place], [place]. Came to the [place] 2002. Lives with her husband. Their son and grandson live nearby.     BP 118/68   Pulse 80   Ht 5\' 4"  (1.626 m)   Wt 223 lb 6.4 oz (101.3 kg)   SpO2 98%   BMI 38.35 kg/m   Physical Exam:  Well appearing NAD HEENT: Unremarkable Neck:  No JVD, no thyromegally Lymphatics:  No adenopathy Back:  No CVA tenderness Lungs:  Clear with no wheezes HEART:  Regular rate rhythm, no murmurs, no rubs, no clicks Abd:  soft, positive bowel sounds, no organomegally, no rebound, no guarding Ext:  2 plus pulses, no edema, no cyanosis, no clubbing Skin:  No rashes no nodules Neuro:  CN II through XII intact, motor grossly intact   DEVICE  Normal device function.  See PaceArt for details.   Assess/Plan: 1. Sinus node dysfunction - she is asymptomatic s/p PPM insertion. 2. PPM - her medtronic DDD PM is working normally and we will recheck in several months. 3. HTN - her blood pressure is well controlled. She will continue her current meds. 4. Arm [name] - this is non-exertional and reproducible with palpitation. No obvious cause though appears to be musculoskeletal.   Anita [name].D.

## 2019-07-12 NOTE — Patient Instructions (Addendum)
Medication Instructions:  Your physician recommends that you continue on your current medications as directed. Please refer to the Current Medication list given to you today.  Labwork: None ordered.  Testing/Procedures: None ordered.  Follow-Up: Your physician wants you to follow-up in: 9 months with Dr. Lovena Le.  You will receive a reminder letter in the mail two months in advance. If you don't receive a letter, please call our office to schedule the follow-up appointment.  Remote monitoring is used to monitor your Pacemaker from home. This monitoring reduces the number of office visits required to check your device to one time per year. It allows [place] to keep an eye on the functioning of your device to ensure it is working properly. You are scheduled for a device check from home on 10/11/2019. You may send your transmission at any time that day. If you have a wireless device, the transmission will be sent automatically. After your physician reviews your transmission, you will receive a postcard with your next transmission date.  Any Other Special Instructions Will Be Listed Below (If Applicable).  If you need a refill on your cardiac medications before your next appointment, please call your pharmacy.

## 2019-07-23 ENCOUNTER — Other Ambulatory Visit: Payer: Self-pay | Admitting: Nurse Practitioner

## 2019-07-23 DIAGNOSIS — Z1231 Encounter for screening mammogram for malignant neoplasm of breast: Secondary | ICD-10-CM

## 2019-07-30 ENCOUNTER — Other Ambulatory Visit: Payer: Self-pay

## 2019-07-30 ENCOUNTER — Ambulatory Visit (INDEPENDENT_AMBULATORY_CARE_PROVIDER_SITE_OTHER): Payer: Self-pay | Admitting: Pharmacist

## 2019-07-30 VITALS — BP 138/76 | HR 60 | Wt 225.4 lb

## 2019-07-30 DIAGNOSIS — I10 Essential (primary) hypertension: Secondary | ICD-10-CM

## 2019-07-30 MED ORDER — AMLODIPINE BESYLATE 10 MG PO TABS: 10.0000 mg | ORAL_TABLET | Freq: Every day | ORAL | 0 refills | Status: DC

## 2019-07-30 MED ORDER — SPIRONOLACTONE 50 MG PO TABS: 50.0000 mg | ORAL_TABLET | Freq: Every day | ORAL | 11 refills | Status: DC

## 2019-07-30 NOTE — Patient Instructions (Addendum)
Continue todos los medicamentos segun recetados  Trate de caminar de 10-15 minutes todos los dia  Mantengase hidratada - tome agua y bebidas sin azucar  reduzaca sal y sodium en la dieta  LLame a la doctora Fleming para removasr tratamient ocn escitalopram 10mg  diarios.

## 2019-07-30 NOTE — Progress Notes (Signed)
Patient ID: Anita [name]                 DOB: 1955-12-20                      MRN: [id number]     HPI: Anita [name] is a 63 y.o. female referred by Dr. Radford Pax to HTN clinic.PMH is significant for HTN and sinus node dysfunction s/p pacemaker.  Patient recently established with new PCP and antidepressant therapy was initiated , but patient stopped therapy after only 1 dose of escitalopram because she "didn't felt any better".  Noted intolerance to multiple medication including lisinopril, valsartan, and chlorthalidone. Metoprolol and HCTZ were discontinue in the past by PCP for unknown reason. Last seen by DR Lovena Le (EP) on Nov/30/2020. Her BP and heart rate was controlled during office visit and arm pain was determined to be musculoskeletal.   Patient presents for HTN follow up accompany by one of her daughters. Denies swelling, dizziness, headaches, chest pain or problems with current therapy.  has severe anxiety that contributes to her elevation BP. Need to work on positive lifestyle modification especially increase phisical activity.   *Visit conducted in [place] with daughter present*  Current HTN meds:  Amlodipine 10mg  daily Spironolactone 50mg  daily  Previously tried:  lisinopril(bilateral arm pain, chills), valsartan(discontinued at hospital dischargedue to bilateral arm pain)  metoprolol succinateand hydrochlorothiazide (stopped taking ~6 years ago, discontinued byMD) Chlorthalidone 25 mg daily (hypokalemia??)  BP goal: <130/80 mmHg  Family History:Father - CAD, CABG; Mother - DCM  Social History:denies tobacco products or alcohol use  Diet:consist of chicken, fish, cheese, "a little salt", no coffee, drinks tea twice a day, does not drink enough water  Exercise:No exercising due to shortness of breath and physcial deconditioning  Home BP readings: none available. Patient gets too anxious and avoid checking her BP at home   Wt Readings from Last 3  Encounters:  07/30/19 225 lb 6.4 oz (102.2 kg)  07/12/19 223 lb 6.4 oz (101.3 kg)  04/09/19 230 lb (104.3 kg)   BP Readings from Last 3 Encounters:  07/30/19 138/76  07/12/19 118/68  07/01/19 (!) 148/78   Pulse Readings from Last 3 Encounters:  07/30/19 60  07/12/19 80  07/01/19 75    Past Medical History:  Diagnosis Date  . ABDOMINAL PAIN 05/05/2009   Qualifier: Diagnosis of  By: Jorene Minors, Scott    . Adjustment disorder with depressed mood 08/30/2009   Qualifier: Diagnosis of  By: Jorene Minors, Scott    . ALLERGIC RHINITIS 12/04/2009   Qualifier: Diagnosis of  By: Jorene Minors, Scott    . Allergy   . Aortic stenosis    mild by echo 03/2019  . CHOLELITHIASIS 05/30/2009   Qualifier: Diagnosis of  By: Jorene Minors, Scott    . COLONIC POLYPS, HX OF 05/09/2010   Qualifier: Diagnosis of  By: Jorene Minors, Scott    . Essential hypertension, benign 05/05/2009   Qualifier: Diagnosis of  By: Jorene Minors, Scott    . GERD 06/12/2009   Qualifier: Diagnosis of  By: Jorene Minors, Scott    . Hypertension   . KNEE PAIN 06/12/2009   Qualifier: Diagnosis of  By: Jorene Minors, Scott    . LIVER MASS 05/30/2009   Qualifier: Diagnosis of  By: Jorene Minors, Scott    . OBESITY 10/24/2010   Qualifier: Diagnosis of  By: Keenan Bachelor RN, Rip Harbour    . TINNITUS, CHRONIC, RIGHT 09/07/2010   Qualifier: Diagnosis of  ByAmil Amen MD, Benjamine Mola    . TOOTH LOSS 09/07/2010   Qualifier: Diagnosis of  By: Amil Amen MD, Benjamine Mola    . 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 VARICOSE VEINS, LOWER EXTREMITIES 05/05/2009   Qualifier: Diagnosis of  By: Versie Starks      Current Outpatient Medications on File Prior to Visit  Medication Sig Dispense Refill  . acetaminophen (TYLENOL) 500 MG tablet Take 500 mg by mouth every 6 (six) hours as needed for moderate pain or headache.     No current facility-administered medications on file prior to visit.    Allergies  Allergen  Reactions  . Chlorthalidone     hypokalemia  . Lisinopril Other (See Comments)    Chills and arm pain    Blood pressure 138/76, pulse 60, weight 225 lb 6.4 oz (102.2 kg), SpO2 97 %.  ESSENTIAL HYPERTENSION, BENIGN Blood pressure slightly above goal but greatly improved since initiating amlodipine and spironolactone therapy. Patient denies adverse reaction to current therapy. Her anxiety/depression is affecting her BP as well. Recommendation to contact PCP and resume escitalopram was given to patient and daughter.   Will continue current medication regimen as prescribed and schedule follow up in 2 months. Patient is to continue working on positive lifestyle modification including better hydration, less sodium in diet, and increase physical activity.  She will try to check her blood pressure once per week and bring readings to her OV follow up.   Lenae Wherley Rodriguez-Guzman PharmD, BCPS, [place] [id number] 08/10/2019 4:28 PM

## 2019-08-03 ENCOUNTER — Ambulatory Visit: Payer: Self-pay

## 2019-08-10 ENCOUNTER — Encounter: Payer: Self-pay | Admitting: Pharmacist

## 2019-08-10 ENCOUNTER — Ambulatory Visit: Payer: Self-pay

## 2019-08-10 NOTE — Assessment & Plan Note (Signed)
Blood pressure slightly above goal but greatly improved since initiating amlodipine and spironolactone therapy. Patient denies adverse reaction to current therapy. Her anxiety/depression is affecting her BP as well. Recommendation to contact PCP and resume escitalopram was given to patient and daughter.   Will continue current medication regimen as prescribed and schedule follow up in 2 months. Patient is to continue working on positive lifestyle modification including better hydration, less sodium in diet, and increase physical activity.  She will try to check her blood pressure once per week and bring readings to her OV follow up.

## 2019-09-14 ENCOUNTER — Ambulatory Visit
Admission: RE | Admit: 2019-09-14 | Discharge: 2019-09-14 | Disposition: A | Discharge status: Home / Self Care | Payer: No Typology Code available for payment source | Source: Ambulatory Visit | Attending: Nurse Practitioner | Admitting: Nurse Practitioner

## 2019-09-14 ENCOUNTER — Other Ambulatory Visit: Payer: Self-pay

## 2019-09-14 DIAGNOSIS — Z1231 Encounter for screening mammogram for malignant neoplasm of breast: Secondary | ICD-10-CM

## 2019-09-28 ENCOUNTER — Ambulatory Visit: Payer: No Typology Code available for payment source

## 2019-09-28 NOTE — Progress Notes (Deleted)
Patient ID: Anita [name]                 DOB: Oct 20, 1955                      MRN: [id number]     HPI: Anita [name] is a 64 y.o. female referred by Dr. Radford Pax to HTN clinic.PMH is significant for HTN and sinus node dysfunction s/p pacemaker.  Patient recently established with new PCP and antidepressant therapy was initiated , but patient stopped therapy after only 1 dose of escitalopram because she "didn't felt any better".  Noted intolerance to multiple medication including lisinopril, valsartan, and chlorthalidone. Metoprolol and HCTZ were discontinue in the past by PCP for unknown reason. Last seen by DR Lovena Le (EP) on Nov/30/2020. Her BP and heart rate was controlled during office visit and arm pain was determined to be musculoskeletal.   Patient presents for HTN follow up accompany by one of her daughters. Denies swelling, dizziness, headaches, chest pain or problems with current therapy.  has severe anxiety that contributes to her elevation BP. Need to work on positive lifestyle modification especially increase phisical activity.   *Visit conducted in [place] with daughter present*  Current HTN meds:  Amlodipine 10mg  daily Spironolactone 50mg  daily  Previously tried:  lisinopril(bilateral arm pain, chills), valsartan(discontinued at hospital dischargedue to bilateral arm pain)  metoprolol succinateand hydrochlorothiazide (stopped taking ~6 years ago, discontinued byMD) Chlorthalidone 25 mg daily (hypokalemia??)  BP goal: <130/80 mmHg  Family History:Father - CAD, CABG; Mother - DCM  Social History:denies tobacco products or alcohol use  Diet:consist of chicken, fish, cheese, "a little salt", no coffee, drinks tea twice a day, does not drink enough water  Exercise:No exercising due to shortness of breath and physcial deconditioning  Home BP readings: none available. Patient gets too anxious and avoid checking her BP at home   Wt Readings from Last 3  Encounters:  07/30/19 225 lb 6.4 oz (102.2 kg)  07/12/19 223 lb 6.4 oz (101.3 kg)  04/09/19 230 lb (104.3 kg)   BP Readings from Last 3 Encounters:  07/30/19 138/76  07/12/19 118/68  07/01/19 (!) 148/78   Pulse Readings from Last 3 Encounters:  07/30/19 60  07/12/19 80  07/01/19 75    Past Medical History:  Diagnosis Date  . ABDOMINAL PAIN 05/05/2009   Qualifier: Diagnosis of  By: Jorene Minors, Scott    . Adjustment disorder with depressed mood 08/30/2009   Qualifier: Diagnosis of  By: Jorene Minors, Scott    . ALLERGIC RHINITIS 12/04/2009   Qualifier: Diagnosis of  By: Jorene Minors, Scott    . Allergy   . Aortic stenosis    mild by echo 03/2019  . CHOLELITHIASIS 05/30/2009   Qualifier: Diagnosis of  By: Jorene Minors, Scott    . COLONIC POLYPS, HX OF 05/09/2010   Qualifier: Diagnosis of  By: Jorene Minors, Scott    . Essential hypertension, benign 05/05/2009   Qualifier: Diagnosis of  By: Jorene Minors, Scott    . GERD 06/12/2009   Qualifier: Diagnosis of  By: Jorene Minors, Scott    . Hypertension   . KNEE PAIN 06/12/2009   Qualifier: Diagnosis of  By: Jorene Minors, Scott    . LIVER MASS 05/30/2009   Qualifier: Diagnosis of  By: Jorene Minors, Scott    . OBESITY 10/24/2010   Qualifier: Diagnosis of  By: Keenan Bachelor RN, Rip Harbour    . TINNITUS, CHRONIC, RIGHT 09/07/2010   Qualifier: Diagnosis of  ByAmil Amen MD, Benjamine Mola    . TOOTH LOSS 09/07/2010   Qualifier: Diagnosis of  By: Amil Amen MD, Benjamine Mola    . 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 VARICOSE VEINS, LOWER EXTREMITIES 05/05/2009   Qualifier: Diagnosis of  By: Versie Starks      Current Outpatient Medications on File Prior to Visit  Medication Sig Dispense Refill  . acetaminophen (TYLENOL) 500 MG tablet Take 500 mg by mouth every 6 (six) hours as needed for moderate pain or headache.    Marland Kitchen amLODipine (NORVASC) 10 MG tablet Take 1 tablet (10 mg total) by mouth daily. 30 tablet 0  .  spironolactone (ALDACTONE) 50 MG tablet Take 1 tablet (50 mg total) by mouth daily. 30 tablet 11   No current facility-administered medications on file prior to visit.    Allergies  Allergen Reactions  . Chlorthalidone     hypokalemia  . Lisinopril Other (See Comments)    Chills and arm pain    There were no vitals taken for this visit.  No problem-specific Assessment & Plan notes found for this encounter.   Nanette Wirsing Rodriguez-Guzman PharmD, BCPS, [place] [address] 09/28/2019 7:32 AM

## 2019-10-11 ENCOUNTER — Ambulatory Visit (INDEPENDENT_AMBULATORY_CARE_PROVIDER_SITE_OTHER): Payer: Self-pay | Admitting: *Deleted

## 2019-10-11 DIAGNOSIS — R55 Syncope and collapse: Secondary | ICD-10-CM

## 2019-10-11 LAB — CUP PACEART REMOTE DEVICE CHECK
Battery Remaining Longevity: 158 mo
Battery Voltage: 3.15 V
Brady Statistic AP VP Percent: 0.16 %
Brady Statistic AP VS Percent: 92.04 %
Brady Statistic AS VP Percent: 0.02 %
Brady Statistic AS VS Percent: 7.78 %
Brady Statistic RA Percent Paced: 92.14 %
Brady Statistic RV Percent Paced: 0.18 %
Date Time Interrogation Session: 20210228232303
Implantable Lead Implant Date: 20200817
Implantable Lead Implant Date: 20200828
Implantable Lead Location: 753859
Implantable Lead Location: 753860
Implantable Lead Model: 5076
Implantable Lead Model: 5076
Implantable Pulse Generator Implant Date: 20200817
Lead Channel Impedance Value: 380 Ohm
Lead Channel Impedance Value: 399 Ohm
Lead Channel Impedance Value: 437 Ohm
Lead Channel Impedance Value: 532 Ohm
Lead Channel Pacing Threshold Amplitude: 0.5 V
Lead Channel Pacing Threshold Amplitude: 0.625 V
Lead Channel Pacing Threshold Pulse Width: 0.4 ms
Lead Channel Pacing Threshold Pulse Width: 0.4 ms
Lead Channel Sensing Intrinsic Amplitude: 1.625 mV
Lead Channel Sensing Intrinsic Amplitude: 1.625 mV
Lead Channel Sensing Intrinsic Amplitude: 10.375 mV
Lead Channel Sensing Intrinsic Amplitude: 10.375 mV
Lead Channel Setting Pacing Amplitude: 1.5 V
Lead Channel Setting Pacing Amplitude: 2.5 V
Lead Channel Setting Pacing Pulse Width: 0.4 ms
Lead Channel Setting Sensing Sensitivity: 1.2 mV

## 2019-10-11 NOTE — Progress Notes (Signed)
PPM Remote  

## 2019-10-21 ENCOUNTER — Ambulatory Visit (INDEPENDENT_AMBULATORY_CARE_PROVIDER_SITE_OTHER): Payer: Self-pay | Admitting: Pharmacist

## 2019-10-21 ENCOUNTER — Other Ambulatory Visit: Payer: Self-pay

## 2019-10-21 VITALS — BP 136/78 | HR 66

## 2019-10-21 DIAGNOSIS — I10 Essential (primary) hypertension: Secondary | ICD-10-CM

## 2019-10-21 NOTE — Progress Notes (Signed)
Patient ID: Anita [name]                 DOB: [birth year]                      MRN: [id number]     HPI: Anita [name] is a 64 y.o. female referred by Dr. Radford Pax to HTN clinic.PMH is significant for HTN and sinus node dysfunction s/p pacemaker.  Patient is taking her antidepressant and family members are encouraging her to take short walks daily.  Patient presents for HTN follow up accompany by her granddaughter. Denies swelling, dizziness, headaches, chest pain or problems with current therapy.  She continues to work on positive lifestyle modification especially increase phisical activity.    *Visit conducted in [place] with grand-daughter present*  Current HTN meds:  Amlodipine 10mg  daily Spironolactone 50mg  daily  Previously tried:  lisinopril(bilateral arm pain, chills), valsartan(discontinued at hospital dischargedue to bilateral arm pain)  metoprolol succinateand hydrochlorothiazide (stopped taking ~6 years ago, discontinued byMD) Chlorthalidone 25 mg daily (hypokalemia??)  BP goal: <130/80 mmHg  Family History:Father - CAD, CABG; Mother - DCM  Social History:denies tobacco products or alcohol use  Diet:consist of chicken, fish, cheese, "a little salt", no coffee, drinks tea twice a day, does not drink enough water  Exercise:No exercising due to shortness of breath and physcial deconditioning  Home BP readings: none available. Patient gets too anxious and avoid checking her BP at home  Wt Readings from Last 3 Encounters:  07/30/19 225 lb 6.4 oz (102.2 kg)  07/12/19 223 lb 6.4 oz (101.3 kg)  04/09/19 230 lb (104.3 kg)   BP Readings from Last 3 Encounters:  10/21/19 136/78  07/30/19 138/76  07/12/19 118/68   Pulse Readings from Last 3 Encounters:  10/21/19 66  07/30/19 60  07/12/19 80    Past Medical History:  Diagnosis Date  . ABDOMINAL PAIN 05/05/2009   Qualifier: Diagnosis of  By: Jorene Minors, Scott    . Adjustment disorder with  depressed mood 08/30/2009   Qualifier: Diagnosis of  By: Jorene Minors, Scott    . ALLERGIC RHINITIS 12/04/2009   Qualifier: Diagnosis of  By: Jorene Minors, Scott    . Allergy   . Aortic stenosis    mild by echo 03/2019  . CHOLELITHIASIS 05/30/2009   Qualifier: Diagnosis of  By: Jorene Minors, Scott    . COLONIC POLYPS, HX OF 05/09/2010   Qualifier: Diagnosis of  By: Jorene Minors, Scott    . Essential hypertension, benign 05/05/2009   Qualifier: Diagnosis of  By: Jorene Minors, Scott    . GERD 06/12/2009   Qualifier: Diagnosis of  By: Jorene Minors, Scott    . Hypertension   . KNEE PAIN 06/12/2009   Qualifier: Diagnosis of  By: Jorene Minors, Scott    . LIVER MASS 05/30/2009   Qualifier: Diagnosis of  By: Jorene Minors, Scott    . OBESITY 10/24/2010   Qualifier: Diagnosis of  By: Keenan Bachelor RN, Rip Harbour    . TINNITUS, CHRONIC, RIGHT 09/07/2010   Qualifier: Diagnosis of  By: Amil Amen MD, Benjamine Mola    . TOOTH LOSS 09/07/2010   Qualifier: Diagnosis of  By: Amil Amen MD, Benjamine Mola    . 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 VARICOSE VEINS, LOWER EXTREMITIES 05/05/2009   Qualifier: Diagnosis of  By: Versie Starks      Current Outpatient Medications on File Prior to Visit  Medication Sig  Dispense Refill  . acetaminophen (TYLENOL) 500 MG tablet Take 500 mg by mouth every 6 (six) hours as needed for moderate pain or headache.    Marland Kitchen amLODipine (NORVASC) 10 MG tablet Take 1 tablet (10 mg total) by mouth daily. 30 tablet 0  . escitalopram (LEXAPRO) 10 MG tablet Take 5 mg by mouth daily.    Marland Kitchen spironolactone (ALDACTONE) 50 MG tablet Take 1 tablet (50 mg total) by mouth daily. 30 tablet 11   No current facility-administered medications on file prior to visit.    Allergies  Allergen Reactions  . Chlorthalidone     hypokalemia  . Lisinopril Other (See Comments)    Chills and arm pain    Blood pressure 136/78, pulse 66, SpO2 99 %.  ESSENTIAL HYPERTENSION,  BENIGN Blood pressure remains slightly above goal but stable since Nov/2020. Patient reports compliance with all BP medication, escitalopram and lifestyle modifications.   Will continue current medication without changes and follow up as needed. Patient also asked if okay to use "X-Raydol" tablets. Jacqulyn Cane is a glucosamine/chondroitin supplement and no interactions with current therapy is expected; therefore is okay to take as needed.   Keymon Mcelroy Rodriguez-Guzman PharmD, BCPS, [place] [address] 10/28/2019 5:20 PM

## 2019-10-21 NOTE — Patient Instructions (Signed)
*   No cambios en medicamento*  * Puede tomar X-Raydol 2 tabletas al dia con comida*  * T[place] agua todos los dias  * Camine todos los dia por 10 - 15 minutos

## 2019-10-28 ENCOUNTER — Encounter: Payer: Self-pay | Admitting: Pharmacist

## 2019-10-28 NOTE — Assessment & Plan Note (Signed)
Blood pressure remains slightly above goal but stable since Nov/2020. Patient reports compliance with all BP medication, escitalopram and lifestyle modifications.   Will continue current medication without changes and follow up as needed. Patient also asked if okay to use "X-Raydol" tablets. Anita [name] is a glucosamine/chondroitin supplement and no interactions with current therapy is expected; therefore is okay to take as needed.

## 2019-11-18 ENCOUNTER — Telehealth: Payer: Self-pay

## 2019-11-18 NOTE — Telephone Encounter (Signed)
Pt daughter called stating that her mom felt some shock in her pacemaker yesterday when she was cleaning the stove; she is feeling ok today but is very concerned; I had her send a transmission which went through if you can please call her and advise. Thanks

## 2019-11-18 NOTE — Telephone Encounter (Signed)
Transmission received, normal function. Daughter notified.   Chanetta Marshall, NP 11/18/2019 12:17 PM

## 2020-01-12 ENCOUNTER — Ambulatory Visit (INDEPENDENT_AMBULATORY_CARE_PROVIDER_SITE_OTHER): Payer: No Typology Code available for payment source | Admitting: *Deleted

## 2020-01-12 DIAGNOSIS — I495 Sick sinus syndrome: Secondary | ICD-10-CM

## 2020-01-12 LAB — CUP PACEART REMOTE DEVICE CHECK
Battery Remaining Longevity: 155 mo
Battery Voltage: 3.11 V
Brady Statistic AP VP Percent: 0.11 %
Brady Statistic AP VS Percent: 91.28 %
Brady Statistic AS VP Percent: 0.03 %
Brady Statistic AS VS Percent: 8.58 %
Brady Statistic RA Percent Paced: 91.37 %
Brady Statistic RV Percent Paced: 0.14 %
Date Time Interrogation Session: 20210601201412
Implantable Lead Implant Date: 20200817
Implantable Lead Implant Date: 20200828
Implantable Lead Location: 753859
Implantable Lead Location: 753860
Implantable Lead Model: 5076
Implantable Lead Model: 5076
Implantable Pulse Generator Implant Date: 20200817
Lead Channel Impedance Value: 361 Ohm
Lead Channel Impedance Value: 399 Ohm
Lead Channel Impedance Value: 418 Ohm
Lead Channel Impedance Value: 494 Ohm
Lead Channel Pacing Threshold Amplitude: 0.5 V
Lead Channel Pacing Threshold Amplitude: 0.625 V
Lead Channel Pacing Threshold Pulse Width: 0.4 ms
Lead Channel Pacing Threshold Pulse Width: 0.4 ms
Lead Channel Sensing Intrinsic Amplitude: 11.75 mV
Lead Channel Sensing Intrinsic Amplitude: 11.75 mV
Lead Channel Sensing Intrinsic Amplitude: 2.875 mV
Lead Channel Sensing Intrinsic Amplitude: 2.875 mV
Lead Channel Setting Pacing Amplitude: 1.5 V
Lead Channel Setting Pacing Amplitude: 2.5 V
Lead Channel Setting Pacing Pulse Width: 0.4 ms
Lead Channel Setting Sensing Sensitivity: 1.2 mV

## 2020-01-14 NOTE — Progress Notes (Signed)
Remote pacemaker transmission.   

## 2020-03-03 ENCOUNTER — Other Ambulatory Visit: Payer: Self-pay

## 2020-03-03 ENCOUNTER — Emergency Department (HOSPITAL_COMMUNITY)
Admission: EM | Admit: 2020-03-03 | Discharge: 2020-03-04 | Disposition: A | Discharge status: Home / Self Care | Payer: Self-pay | Attending: Emergency Medicine | Admitting: Emergency Medicine

## 2020-03-03 DIAGNOSIS — Z950 Presence of cardiac pacemaker: Secondary | ICD-10-CM | POA: Insufficient documentation

## 2020-03-03 DIAGNOSIS — I10 Essential (primary) hypertension: Secondary | ICD-10-CM | POA: Insufficient documentation

## 2020-03-03 DIAGNOSIS — N951 Menopausal and female climacteric states: Secondary | ICD-10-CM | POA: Insufficient documentation

## 2020-03-03 DIAGNOSIS — Z79899 Other long term (current) drug therapy: Secondary | ICD-10-CM | POA: Insufficient documentation

## 2020-03-03 DIAGNOSIS — R109 Unspecified abdominal pain: Secondary | ICD-10-CM | POA: Insufficient documentation

## 2020-03-04 ENCOUNTER — Encounter (HOSPITAL_COMMUNITY): Payer: Self-pay | Admitting: Emergency Medicine

## 2020-03-04 ENCOUNTER — Emergency Department (HOSPITAL_COMMUNITY): Payer: Self-pay

## 2020-03-04 ENCOUNTER — Other Ambulatory Visit: Payer: Self-pay

## 2020-03-04 LAB — COMPREHENSIVE METABOLIC PANEL
ALT: 21 U/L (ref 0–44)
AST: 23 U/L (ref 15–41)
Albumin: 4.1 g/dL (ref 3.5–5.0)
Alkaline Phosphatase: 57 U/L (ref 38–126)
Anion gap: 12 (ref 5–15)
BUN: 19 mg/dL (ref 8–23)
CO2: 22 mmol/L (ref 22–32)
Calcium: 9.5 mg/dL (ref 8.9–10.3)
Chloride: 103 mmol/L (ref 98–111)
Creatinine, Ser: 0.91 mg/dL (ref 0.44–1.00)
GFR calc Af Amer: >60 mL/min (ref >60)
GFR calc non Af Amer: >60 mL/min (ref >60)
Glucose, Bld: 128 mg/dL — ABNORMAL HIGH (ref 70–99)
Potassium: 4.5 mmol/L (ref 3.5–5.1)
Sodium: 137 mmol/L (ref 135–145)
Total Bilirubin: 0.7 mg/dL (ref 0.3–1.2)
Total Protein: 7.8 g/dL (ref 6.5–8.1)

## 2020-03-04 LAB — CBC WITH DIFFERENTIAL/PLATELET
Abs Immature Granulocytes: 0.05 10*3/uL (ref 0.00–0.07)
Basophils Absolute: 0 10*3/uL (ref 0.0–0.1)
Basophils Relative: 0 %
Eosinophils Absolute: 0.1 10*3/uL (ref 0.0–0.5)
Eosinophils Relative: 1 %
HCT: 45.6 % (ref 36.0–46.0)
Hemoglobin: 15.3 g/dL — ABNORMAL HIGH (ref 12.0–15.0)
Immature Granulocytes: 1 %
Lymphocytes Relative: 24 %
Lymphs Abs: 1.9 10*3/uL (ref 0.7–4.0)
MCH: 30.4 pg (ref 26.0–34.0)
MCHC: 33.6 g/dL (ref 30.0–36.0)
MCV: 90.5 fL (ref 80.0–100.0)
Monocytes Absolute: 0.5 10*3/uL (ref 0.1–1.0)
Monocytes Relative: 6 %
Neutro Abs: 5.4 10*3/uL (ref 1.7–7.7)
Neutrophils Relative %: 68 %
Platelets: 248 10*3/uL (ref 150–400)
RBC: 5.04 MIL/uL (ref 3.87–5.11)
RDW: 12.3 % (ref 11.5–15.5)
WBC: 7.9 10*3/uL (ref 4.0–10.5)
nRBC: 0 % (ref 0.0–0.2)

## 2020-03-04 LAB — URINALYSIS, ROUTINE W REFLEX MICROSCOPIC
Bilirubin Urine: NEGATIVE
Glucose, UA: NEGATIVE mg/dL
Hgb urine dipstick: NEGATIVE
Ketones, ur: NEGATIVE mg/dL
Nitrite: NEGATIVE
Protein, ur: NEGATIVE mg/dL
Specific Gravity, Urine: 1.014 (ref 1.005–1.030)
pH: 5 (ref 5.0–8.0)

## 2020-03-04 MED ORDER — METHOCARBAMOL 500 MG PO TABS
500.0000 mg | ORAL_TABLET | Freq: Three times a day (TID) | ORAL | 0 refills | Status: DC | PRN
Start: 2020-03-04 — End: 2020-10-25

## 2020-03-04 NOTE — ED Provider Notes (Signed)
[facility] EMERGENCY DEPARTMENT Provider Note   CSN: [id number] Arrival date & time: 03/03/20  2315     History Chief Complaint  Patient presents with   Hot Flashes / Hypertension    Anita [name] is a 64 y.o. female. Patient speaks Spanish and is translated by family member. HPI Patient presents with [name] in her right flank back and chest.  States it comes and goes.  Has had for last couple days.  Last around 30 minutes.  States she will feel like she has to go the bathroom with the episodes.  Come on and go away without any aggravating or alleviating factors.  No nausea or vomiting.  No fevers.  No dysuria.  When asked for the [name] is she points to her right upper abdomen.  Has had previous cholecystectomy.  Patient has a history of hypertension and states she is on blood pressure medicines for it.    Past Medical History:  Diagnosis Date   ABDOMINAL [name] 05/05/2009   Qualifier: Diagnosis of  By: Jorene Minors, Scott     Adjustment disorder with depressed mood 08/30/2009   Qualifier: Diagnosis of  By: Jorene Minors, Scott     ALLERGIC RHINITIS 12/04/2009   Qualifier: Diagnosis of  By: Jorene Minors, Scott     Allergy    Aortic stenosis    mild by echo 03/2019   CHOLELITHIASIS 05/30/2009   Qualifier: Diagnosis of  By: Jorene Minors, Scott     COLONIC POLYPS, HX OF 05/09/2010   Qualifier: Diagnosis of  By: Jorene Minors, Scott     Essential hypertension, benign 05/05/2009   Qualifier: Diagnosis of  By: Jorene Minors, Scott     GERD 06/12/2009   Qualifier: Diagnosis of  By: Jorene Minors, Scott     Hypertension    KNEE [name] 06/12/2009   Qualifier: Diagnosis of  By: Versie Starks     LIVER MASS 05/30/2009   Qualifier: Diagnosis of  By: Jorene Minors, Scott     OBESITY 10/24/2010   Qualifier: Diagnosis of  By: Keenan Bachelor RN, [place], CHRONIC, RIGHT 09/07/2010   Qualifier: Diagnosis of  By: Amil Amen MD, Talbert Nan LOSS 09/07/2010    Qualifier: Diagnosis of  By: Amil Amen MD, [place] 08/30/2009   Qualifier: Diagnosis of  By: Jorene Minors, Scott     VARICOSE VEINS, LOWER EXTREMITIES 05/05/2009   Qualifier: Diagnosis of  By: Jorene Minors, [place]      Patient Active Problem List   Diagnosis Date Noted   Sinus node dysfunction ([place]) 07/12/2019   Pacemaker 44/10/4740   Complication associated with cardiac pacemaker lead 04/09/2019   Pacemaker lead failure, 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   Syncope 03/27/2019   Aortic stenosis    BMI 45.0-49.9, adult ([place]) 10/19/2014   Venous stasis dermatitis of left lower extremity 10/19/2014   OBESITY 10/24/2010   TINNITUS, CHRONIC, RIGHT 09/07/2010   TOOTH LOSS 09/07/2010   COLONIC POLYPS, HX OF 05/09/2010   ALLERGIC RHINITIS 12/04/2009   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;  Service: Cardiovascular;  Laterality: N/A;   PACEMAKER IMPLANT N/A 03/29/2019   Procedure: PACEMAKER IMPLANT;  Surgeon: Evans Lance, MD;  Location: [facility];  Service: Cardiovascular;  Laterality: N/A;     OB History   No obstetric history on file.     Family History  Problem Relation Age of Onset   Hypertension Sister    Hyperlipidemia Sister    Hypertension Brother    Hyperlipidemia Brother    Migraines Daughter    Hypertension Sister    Hyperlipidemia Sister    Cancer Sister    Hypertension Sister    Hyperlipidemia Sister     Hypertension Brother    Hyperlipidemia Brother    Hypertension Brother    Hyperlipidemia Brother    Hypertension Brother    Hyperlipidemia Brother    Hypertension Brother    Hyperlipidemia Brother    Diabetes Paternal Grandmother    Hypertension Paternal Grandmother     Social History   Tobacco Use   Smoking status: Never Smoker   Smokeless tobacco: Never Used  Substance Use Topics   Alcohol use: No    Alcohol/week: 0.0 standard drinks   Drug use: No    Home Medications Prior to Admission medications   Medication Sig Start Date End Date Taking? Authorizing Provider  acetaminophen (TYLENOL) 500 MG tablet Take 500 mg by mouth every 6 (six) hours as needed for moderate [name] or headache.    [provider]  amLODipine (NORVASC) 10 MG tablet Take 1 tablet (10 mg total) by mouth daily. 07/30/19   Sueanne Margarita, MD  escitalopram (LEXAPRO) 10 MG tablet Take 5 mg by mouth daily.    [provider]  methocarbamol (ROBAXIN) 500 MG tablet Take 1 tablet (500 mg total) by mouth every 8 (eight) hours as needed for muscle spasms. 03/04/20   Davonna Belling, MD  spironolactone (ALDACTONE) 50 MG tablet Take 1 tablet (50 mg total) by mouth daily. 07/30/19 07/29/20  Sueanne Margarita, MD    Allergies    Chlorthalidone and Lisinopril  Review of Systems   Review of Systems  Constitutional: Negative for appetite change.  HENT: Negative for congestion.   Respiratory: Negative for shortness of breath.   Gastrointestinal: Positive for abdominal [name].  Genitourinary: Negative for dysuria.  Musculoskeletal: Negative for back [name].  Skin: Negative for rash.  Neurological: Negative for weakness.  Psychiatric/Behavioral: Negative for confusion.    Physical Exam Updated Vital Signs BP (!) 136/61    Pulse 62    Temp 98.7 F (37.1 C) (Oral)    Resp 13    Ht 5\' 7"  (1.702 m)    Wt (!) 95 kg    SpO2 99%    BMI 32.80 kg/m   Physical Exam Vitals and nursing note  reviewed.  HENT:     Head: Normocephalic.  Eyes:     Extraocular Movements: Extraocular movements intact.  Cardiovascular:     Rate and Rhythm: Regular rhythm.  Abdominal:     Tenderness: There is abdominal tenderness.     Comments: Mild right upper quadrant tenderness without rebound or guarding.  Genitourinary:    Comments: No CVA tenderness. Musculoskeletal:     Cervical back: Neck supple.     Right lower leg: No edema.     Left lower leg: No edema.  Skin:    Capillary Refill: Capillary refill takes  less than 2 seconds.     Findings: No rash.  Neurological:     Mental Status: She is alert.     ED Results / Procedures / Treatments   Labs (all labs ordered are listed, but only abnormal results are displayed) Labs Reviewed  CBC WITH DIFFERENTIAL/PLATELET - Abnormal; Notable for the following components:      Result Value   Hemoglobin 15.3 (*)    All other components within normal limits  COMPREHENSIVE METABOLIC PANEL - Abnormal; Notable for the following components:   Glucose, Bld 128 (*)    All other components within normal limits  URINALYSIS, ROUTINE W REFLEX MICROSCOPIC - Abnormal; Notable for the following components:   Leukocytes,Ua LARGE (*)    Bacteria, UA RARE (*)    All other components within normal limits  URINE CULTURE    EKG None  Radiology US Abdomen Complete  Result Date: 03/04/2020 CLINICAL DATA:  RIGHT upper quadrant and RIGHT flank [name] for 20 days EXAM: ABDOMEN ULTRASOUND COMPLETE COMPARISON:  05/09/2009 FINDINGS: Gallbladder: Surgically absent Common bile duct: Diameter: 4 mm, normal Liver: Echogenic parenchyma, likely fatty infiltration though this can be seen with cirrhosis and certain infiltrative disorders. No definite hepatic mass or nodularity identified though assessment of intrahepatic detail is suboptimal due to body habitus and increased hepatic echogenicity with sound attenuation. Portal vein is patent on color Doppler imaging with  normal direction of blood flow towards the liver. IVC: Short segment at the liver normal appearance. Remainder obscured by bowel gas. Pancreas: Obscured by bowel gas Spleen: Normal appearance, 8.1 cm length Right Kidney: Length: 5.4 cm. Normal morphology without mass or hydronephrosis. Left Kidney: Length: 10.1 cm. Normal morphology without mass or hydronephrosis. Abdominal aorta: Normal caliber at proximal and mid portions with distal portion obscured by gas. Other findings: No upper abdominal free fluid IMPRESSION: Post cholecystectomy. Probable fatty infiltration of liver as above. Inadequate visualization of intrahepatic detail due to body habitus and sound attenuation; if better intra hepatic visualization is required recommend MR or CT. Pancreas and distal abdominal aorta obscured by bowel gas. Electronically Signed   By: Lavonia Dana M.D.   On: 03/04/2020 12:55    Procedures Procedures (including critical care time)  Medications Ordered in ED Medications - No data to display  ED Course  I have reviewed the triage vital signs and the nursing notes.  Pertinent labs & imaging results that were available during my care of the patient were reviewed by me and considered in my medical decision making (see chart for details).    MDM Rules/Calculators/A&P                          Patient with right flank/abdominal [name].  Urine showed some white cells without clear infection.  Ultrasound done to evaluate right upper quadrant aorta and kidney.  All overall reassuring.  No rash we did have slight redness in the flank under the bra.  Potentially could be an early zoster but no zoster lesions seen.  Instructed family on this.  Will discharge home with outpatient follow-up as needed. Final Clinical Impression(s) / ED Diagnoses Final diagnoses:  Flank [name]    Rx / DC Orders ED Discharge Orders         Ordered    methocarbamol (ROBAXIN) 500 MG tablet  Every 8 hours PRN     Discontinue  Reprint      03/04/20 1319  Davonna Belling, MD 03/04/20 1555

## 2020-03-04 NOTE — ED Triage Notes (Signed)
Patient reports hot flashes at feet/head and arms for several days , hypertensive at triage , she has no medications for her hypertension .

## 2020-03-04 NOTE — ED Notes (Signed)
Patient transported to Ultrasound 

## 2020-03-05 LAB — URINE CULTURE: Culture: <10000 — AB

## 2020-04-10 ENCOUNTER — Ambulatory Visit (INDEPENDENT_AMBULATORY_CARE_PROVIDER_SITE_OTHER): Payer: Self-pay | Admitting: *Deleted

## 2020-04-10 DIAGNOSIS — R55 Syncope and collapse: Secondary | ICD-10-CM

## 2020-04-10 LAB — CUP PACEART REMOTE DEVICE CHECK
Battery Remaining Longevity: 153 mo
Battery Voltage: 3.08 V
Brady Statistic AP VP Percent: 0.05 %
Brady Statistic AP VS Percent: 81.15 %
Brady Statistic AS VP Percent: 0.01 %
Brady Statistic AS VS Percent: 18.79 %
Brady Statistic RA Percent Paced: 81.11 %
Brady Statistic RV Percent Paced: 0.06 %
Date Time Interrogation Session: 20210830003128
Implantable Lead Implant Date: 20200817
Implantable Lead Implant Date: 20200828
Implantable Lead Location: 753859
Implantable Lead Location: 753860
Implantable Lead Model: 5076
Implantable Lead Model: 5076
Implantable Pulse Generator Implant Date: 20200817
Lead Channel Impedance Value: 361 Ohm
Lead Channel Impedance Value: 399 Ohm
Lead Channel Impedance Value: 418 Ohm
Lead Channel Impedance Value: 513 Ohm
Lead Channel Pacing Threshold Amplitude: 0.5 V
Lead Channel Pacing Threshold Amplitude: 0.625 V
Lead Channel Pacing Threshold Pulse Width: 0.4 ms
Lead Channel Pacing Threshold Pulse Width: 0.4 ms
Lead Channel Sensing Intrinsic Amplitude: 10.875 mV
Lead Channel Sensing Intrinsic Amplitude: 10.875 mV
Lead Channel Sensing Intrinsic Amplitude: 2.125 mV
Lead Channel Sensing Intrinsic Amplitude: 2.125 mV
Lead Channel Setting Pacing Amplitude: 1.5 V
Lead Channel Setting Pacing Amplitude: 2.5 V
Lead Channel Setting Pacing Pulse Width: 0.4 ms
Lead Channel Setting Sensing Sensitivity: 1.2 mV

## 2020-04-11 NOTE — Progress Notes (Signed)
Remote pacemaker transmission.   

## 2020-04-20 ENCOUNTER — Ambulatory Visit (INDEPENDENT_AMBULATORY_CARE_PROVIDER_SITE_OTHER): Payer: Self-pay | Admitting: Internal Medicine

## 2020-04-20 ENCOUNTER — Other Ambulatory Visit: Payer: Self-pay

## 2020-04-20 DIAGNOSIS — I495 Sick sinus syndrome: Secondary | ICD-10-CM

## 2020-04-20 NOTE — Patient Instructions (Signed)
Medication Instructions:  Your physician recommends that you continue on your current medications as directed. Please refer to the Current Medication list given to you today.  Labwork: None ordered.  Testing/Procedures: None ordered.  Follow-Up: Your physician wants you to follow-up in: one year with Dr. Lovena Le.   You will receive a reminder letter in the mail two months in advance. If you don't receive a letter, please call our office to schedule the follow-up appointment.  Remote monitoring is used to monitor your Pacemaker from home. This monitoring reduces the number of office visits required to check your device to one time per year. It allows [place] to keep an eye on the functioning of your device to ensure it is working properly. You are scheduled for a device check from home on 07/10/2020. You may send your transmission at any time that day. If you have a wireless device, the transmission will be sent automatically. After your physician reviews your transmission, you will receive a postcard with your next transmission date.  Any Other Special Instructions Will Be Listed Below (If Applicable).  If you need a refill on your cardiac medications before your next appointment, please call your pharmacy.

## 2020-04-20 NOTE — Progress Notes (Signed)
HPI Anita [name] Pain returns today for follow-up.  She is a pleasant 64 year old woman with a history of obesity, sinus node dysfunction, status post pacemaker insertion.  She underwent atrial lead revision several months ago.  In the interim she is been stable.  She describes 1 episode of passing out after she stood up and started walking.  She has occasions where she feels pain throughout her body which lasts for seconds to minutes and then resolve spontaneously and is not related to activity.  Her history is provided by a [place] interpreter. Allergies  Allergen Reactions  . Chlorthalidone     hypokalemia  . Lisinopril Other (See Comments)    Chills and arm pain     Current Outpatient Medications  Medication Sig Dispense Refill  . acetaminophen (TYLENOL) 500 MG tablet Take 500 mg by mouth every 6 (six) hours as needed for moderate pain or headache.    Marland Kitchen amLODipine (NORVASC) 10 MG tablet Take 1 tablet (10 mg total) by mouth daily. 30 tablet 0  . escitalopram (LEXAPRO) 10 MG tablet Take 5 mg by mouth daily.    . methocarbamol (ROBAXIN) 500 MG tablet Take 1 tablet (500 mg total) by mouth every 8 (eight) hours as needed for muscle spasms. 8 tablet 0  . spironolactone (ALDACTONE) 50 MG tablet Take 1 tablet (50 mg total) by mouth daily. 30 tablet 11   No current facility-administered medications for this visit.     Past Medical History:  Diagnosis Date  . ABDOMINAL PAIN 05/05/2009   Qualifier: Diagnosis of  By: Jorene Minors, Scott    . Adjustment disorder with depressed mood 08/30/2009   Qualifier: Diagnosis of  By: Jorene Minors, Scott    . ALLERGIC RHINITIS 12/04/2009   Qualifier: Diagnosis of  By: Jorene Minors, Scott    . Allergy   . Aortic stenosis    mild by echo 03/2019  . CHOLELITHIASIS 05/30/2009   Qualifier: Diagnosis of  By: Jorene Minors, Scott    . COLONIC POLYPS, HX OF 05/09/2010   Qualifier: Diagnosis of  By: Jorene Minors, Scott    . Essential hypertension, benign 05/05/2009     Qualifier: Diagnosis of  By: Jorene Minors, Scott    . GERD 06/12/2009   Qualifier: Diagnosis of  By: Jorene Minors, Scott    . Hypertension   . KNEE PAIN 06/12/2009   Qualifier: Diagnosis of  By: Jorene Minors, Scott    . LIVER MASS 05/30/2009   Qualifier: Diagnosis of  By: Jorene Minors, Scott    . OBESITY 10/24/2010   Qualifier: Diagnosis of  By: Anita [name], [place]    . TINNITUS, CHRONIC, RIGHT 09/07/2010   Qualifier: Diagnosis of  By: Amil Amen MD, Benjamine Mola    . TOOTH LOSS 09/07/2010   Qualifier: Diagnosis of  By: Amil Amen MD, Benjamine Mola    . 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 VARICOSE VEINS, LOWER EXTREMITIES 05/05/2009   Qualifier: Diagnosis of  By: Jorene Minors, Scott      ROS:   All systems reviewed and negative except as noted in the HPI.   Past Surgical History:  Procedure Laterality Date  . ABDOMINAL HYSTERECTOMY    . CESAREAN SECTION    . LEAD REVISION/REPAIR N/A 04/09/2019   Procedure: LEAD REVISION/REPAIR;  Surgeon: Evans Lance, MD;  Location: [facility];  Service: Cardiovascular;  Laterality: N/A;  . PACEMAKER IMPLANT N/A 03/29/2019   Procedure:  PACEMAKER IMPLANT;  Surgeon: Evans Lance, MD;  Location: [facility];  Service: Cardiovascular;  Laterality: N/A;     Family History  Problem Relation Age of Onset  . Hypertension Sister   . Hyperlipidemia Sister   . Hypertension Brother   . Hyperlipidemia Brother   . Migraines Daughter   . Hypertension Sister   . Hyperlipidemia Sister   . Cancer Sister   . Hypertension Sister   . Hyperlipidemia Sister   . Hypertension Brother   . Hyperlipidemia Brother   . Hypertension Brother   . Hyperlipidemia Brother   . Hypertension Brother   . Hyperlipidemia Brother   . Hypertension Brother   . Hyperlipidemia Brother   . Diabetes Paternal Grandmother   . Hypertension Paternal Grandmother      Social History   Socioeconomic History  . Marital status:  Married    Spouse name: Freida Busman  . Number of children: 5  . Years of education: 6th grade  . Highest education level: Not on file  Occupational History  . Occupation: homemaker  Tobacco Use  . Smoking status: Never Smoker  . Smokeless tobacco: Never Used  Substance and Sexual Activity  . Alcohol use: No    Alcohol/week: 0.0 standard drinks  . Drug use: No  . Sexual activity: Not Currently  Other Topics Concern  . Not on file  Social History Narrative   From [place], [place]. Came to the [place] 2002. Lives with her husband. Their son and grandson live nearby.   Social Determinants of Health   Financial Resource Strain:   . Difficulty of Paying Living Expenses: Not on file  Food Insecurity:   . Worried About Charity fundraiser in the Last Year: Not on file  . Ran Out of Food in the Last Year: Not on file  Transportation Needs:   . Lack of Transportation (Medical): Not on file  . Lack of Transportation (Non-Medical): Not on file  Physical Activity:   . Days of Exercise per Week: Not on file  . Minutes of Exercise per Session: Not on file  Stress:   . Feeling of Stress : Not on file  Social Connections:   . Frequency of Communication with Friends and Family: Not on file  . Frequency of Social Gatherings with Friends and Family: Not on file  . Attends Religious Services: Not on file  . Active Member of Clubs or Organizations: Not on file  . Attends Archivist Meetings: Not on file  . Marital Status: Not on file  Intimate Partner Violence:   . Fear of Current or Ex-Partner: Not on file  . Emotionally Abused: Not on file  . Physically Abused: Not on file  . Sexually Abused: Not on file     BP (!) 146/86   Pulse 67   Ht 5\' 7"  (1.702 m)   Wt 226 lb (102.5 kg)   SpO2 96%   BMI 35.40 kg/m   Physical Exam:  Morbidly obese appearing 64 year old woman, looking older than her stated age, NAD HEENT: Unremarkable Neck: Unable to assess JVD, no  thyromegally Lymphatics:  No adenopathy Back:  No CVA tenderness Lungs:  Clear, with no wheezes, rales, or rhonchi HEART:  Regular rate rhythm, no murmurs, no rubs, no clicks Abd:  soft, positive bowel sounds, no organomegally, no rebound, no guarding Ext:  2 plus pulses, no edema, no cyanosis, no clubbing Skin:  No rashes no nodules Neuro:  CN II through XII intact, motor  grossly intact  EKG -normal sinus rhythm with right bundle branch block  DEVICE  Normal device function.  See PaceArt for details.   Assess/Plan: 1.  Sinus node dysfunction -she is stable status post pacemaker insertion 2.  Morbid obesity -she is encouraged to lose weight. 3.  Hypertension -her systolic blood pressure is up a bit today.  She is anxious about coming to the doctor.  She is encouraged to lose weight and maintain a low-sodium diet.  Cristopher Peru, MD

## 2020-04-21 LAB — CUP PACEART INCLINIC DEVICE CHECK
Battery Remaining Longevity: 154 mo
Battery Voltage: 3.07 V
Brady Statistic AP VP Percent: 0.11 %
Brady Statistic AP VS Percent: 88.07 %
Brady Statistic AS VP Percent: 0.02 %
Brady Statistic AS VS Percent: 11.8 %
Brady Statistic RA Percent Paced: 88.13 %
Brady Statistic RV Percent Paced: 0.13 %
Date Time Interrogation Session: 20210909173749
Implantable Lead Implant Date: 20200817
Implantable Lead Implant Date: 20200828
Implantable Lead Location: 753859
Implantable Lead Location: 753860
Implantable Lead Model: 5076
Implantable Lead Model: 5076
Implantable Pulse Generator Implant Date: 20200817
Lead Channel Impedance Value: 380 Ohm
Lead Channel Impedance Value: 437 Ohm
Lead Channel Impedance Value: 475 Ohm
Lead Channel Impedance Value: 551 Ohm
Lead Channel Pacing Threshold Amplitude: 0.5 V
Lead Channel Pacing Threshold Amplitude: 0.75 V
Lead Channel Pacing Threshold Pulse Width: 0.4 ms
Lead Channel Pacing Threshold Pulse Width: 0.4 ms
Lead Channel Sensing Intrinsic Amplitude: 11.25 mV
Lead Channel Sensing Intrinsic Amplitude: 13.75 mV
Lead Channel Sensing Intrinsic Amplitude: 2.125 mV
Lead Channel Sensing Intrinsic Amplitude: 2.75 mV
Lead Channel Setting Pacing Amplitude: 1.5 V
Lead Channel Setting Pacing Amplitude: 2.5 V
Lead Channel Setting Pacing Pulse Width: 0.4 ms
Lead Channel Setting Sensing Sensitivity: 1.2 mV

## 2020-04-21 NOTE — Addendum Note (Signed)
Addended by: Rose Phi on: 04/21/2020 04:25 PM   Modules accepted: Orders

## 2020-07-10 ENCOUNTER — Ambulatory Visit (INDEPENDENT_AMBULATORY_CARE_PROVIDER_SITE_OTHER): Payer: Self-pay

## 2020-07-10 DIAGNOSIS — I495 Sick sinus syndrome: Secondary | ICD-10-CM

## 2020-07-10 LAB — CUP PACEART REMOTE DEVICE CHECK
Battery Remaining Longevity: 151 mo
Battery Voltage: 3.06 V
Brady Statistic AP VP Percent: 0.04 %
Brady Statistic AP VS Percent: 78.99 %
Brady Statistic AS VP Percent: 0.01 %
Brady Statistic AS VS Percent: 20.96 %
Brady Statistic RA Percent Paced: 78.96 %
Brady Statistic RV Percent Paced: 0.05 %
Date Time Interrogation Session: 20211128203516
Implantable Lead Implant Date: 20200817
Implantable Lead Implant Date: 20200828
Implantable Lead Location: 753859
Implantable Lead Location: 753860
Implantable Lead Model: 5076
Implantable Lead Model: 5076
Implantable Pulse Generator Implant Date: 20200817
Lead Channel Impedance Value: 342 Ohm
Lead Channel Impedance Value: 380 Ohm
Lead Channel Impedance Value: 399 Ohm
Lead Channel Impedance Value: 513 Ohm
Lead Channel Pacing Threshold Amplitude: 0.625 V
Lead Channel Pacing Threshold Amplitude: 0.625 V
Lead Channel Pacing Threshold Pulse Width: 0.4 ms
Lead Channel Pacing Threshold Pulse Width: 0.4 ms
Lead Channel Sensing Intrinsic Amplitude: 12.125 mV
Lead Channel Sensing Intrinsic Amplitude: 12.125 mV
Lead Channel Sensing Intrinsic Amplitude: 2.375 mV
Lead Channel Sensing Intrinsic Amplitude: 2.375 mV
Lead Channel Setting Pacing Amplitude: 1.5 V
Lead Channel Setting Pacing Amplitude: 2.5 V
Lead Channel Setting Pacing Pulse Width: 0.4 ms
Lead Channel Setting Sensing Sensitivity: 1.2 mV

## 2020-07-14 NOTE — Progress Notes (Signed)
Remote pacemaker transmission.   

## 2020-07-24 ENCOUNTER — Other Ambulatory Visit: Payer: Self-pay | Admitting: Pharmacist

## 2020-07-24 DIAGNOSIS — I10 Essential (primary) hypertension: Secondary | ICD-10-CM

## 2020-07-24 MED ORDER — AMLODIPINE BESYLATE 10 MG PO TABS: 10.0000 mg | ORAL_TABLET | Freq: Every day | ORAL | 3 refills | Status: DC

## 2020-07-24 MED ORDER — SPIRONOLACTONE 50 MG PO TABS: 50.0000 mg | ORAL_TABLET | Freq: Every day | ORAL | 3 refills | Status: DC

## 2020-08-30 ENCOUNTER — Ambulatory Visit: Payer: Self-pay | Admitting: Nurse Practitioner

## 2020-08-30 NOTE — Telephone Encounter (Signed)
Pt's daughter Anita [name] called to report that pt has been experiencing abdominal pain and spotting that has intensified over the last few months. She has completed menopause years ago. Pt was not with daughter at the time of call .  Called pt using a [employer], [place].  Pt states that she has been having spotting on and off  for about 1 1/5 years. Pt states that it is a very small amount that may be there upon wiping after urination and 10 minutes later - gone.   Pt does not recall a 12 month time period where she had no periods. Pt states she still has ongoing hot flashes and sweats- but also states that she has not had much trouble with menopause.  Per protocol pt should be seen within 2 weeks - unable to secure a 2 week appointment. Was able to schedule on 09/21/2020 with Anita [name].  Pt will call back should she develop dizziness, acute abdominal pain or bleeding increases.  Pt verbalized agreement with POC.  Sending note to [employer] health and wellness to see if they need to change to sooner appointment.     Reason for Disposition . Postmenopausal vaginal bleeding  Answer Assessment - Initial Assessment Questions 1. AMOUNT: "Describe the bleeding that you are having." "How much bleeding is there?"    - SPOTTING: spotting, or pinkish / brownish mucous discharge; does not fill panti-liner or pad    - MILD:  less than 1 pad / hour; less than patient's usual menstrual bleeding   - MODERATE: 1-2 pads / hour; 1 menstrual cup every 6 hours; small-medium blood clots (e.g., pea, grape, small coin)   - SEVERE: soaking 2 or more pads/hour for 2 or more hours; 1 menstrual cup every 2 hours; bleeding not contained by pads or continuous red blood from vagina; large blood clots (e.g., golf ball, large coin)      Spotting 2. ONSET: "When did the bleeding begin?" "Is it continuing now?"    1 1/2 years 3. MENOPAUSE: "When was your last menstrual period?"      Now - does  not remember a time  4. ABDOMINAL PAIN: "Do you have any pain?" "How bad is the pain?"  (e.g., Scale 1-10; mild, moderate, or severe)   - MILD (1-3): doesn't interfere with normal activities, abdomen soft and not tender to touch    - MODERATE (4-7): interferes with normal activities or awakens from sleep, tender to touch    - SEVERE (8-10): excruciating pain, doubled over, unable to do any normal activities    Currently - no, but sometimes mild with bleeding 5. BLOOD THINNERS: "Do you take any blood thinners?" (e.g., Coumadin/warfarin, Pradaxa/dabigatran, aspirin)    None 6. HORMONES: "Are you taking any hormone medications, prescription or OTC?" (e.g., birth control pills, estrogen)     None 7. CAUSE: "What do you think is causing the bleeding?" (e.g., recent gyn surgery, recent gyn procedure; known bleeding disorder, uterine cancer)       Unknown 8. HEMODYNAMIC STATUS: "Are you weak or feeling lightheaded?" If Yes, ask: "Can you stand and walk normally?"       no 9. OTHER SYMPTOMS: "What other symptoms are you having with the bleeding?" (e.g., back pain, burning with urination, fever)    none  Protocols used: VAGINAL BLEEDING - POSTMENOPAUSAL-A-AH

## 2020-09-06 ENCOUNTER — Ambulatory Visit (AMBULATORY_SURGERY_CENTER): Payer: Self-pay

## 2020-09-06 ENCOUNTER — Other Ambulatory Visit: Payer: Self-pay

## 2020-09-06 VITALS — Ht 67.0 in | Wt 224.0 lb

## 2020-09-06 DIAGNOSIS — Z1211 Encounter for screening for malignant neoplasm of colon: Secondary | ICD-10-CM

## 2020-09-06 NOTE — Progress Notes (Signed)
No allergies to soy or egg Pt is not on blood thinners or diet pills Denies issues with sedation/intubation Denies atrial flutter/fib Denies constipation   Emmi instructions given to pt  Pt is aware of Covid safety and care partner requirements.  Verdis Frederickson Oros is patients daughter and is acting as Astronomer.   Interpreter used today at the [facility] for this pt.  Interpreter's name Valora Piccolo  Has study for sleep apnea, but with Covid it has been put on hold.    As pt was reviewing consent form, she (through the daughter and interpreter voiced concern that she was very anxious about having the procedure.  She was worried about her stomach since her sister died of stomach cancer.  She is having symptoms of burning across her abdomen. She wanted to have her stomach looked at too.  She is under the care of a psychologist and has not been able to see him with the weather--which daughter noted may would help to see him before moving forward.  Pt stated and nurse confirmed that she would rather cancel current procedure, see psychologist and schedule visit to see Dr. Loletha Carrow prior to procedure to discuss stomach issues and hx. She then would like to schedule the stomach and colon procedure together.

## 2020-09-20 ENCOUNTER — Encounter: Payer: Self-pay | Admitting: Gastroenterology

## 2020-09-21 ENCOUNTER — Encounter: Payer: Self-pay | Admitting: Physician Assistant

## 2020-09-21 ENCOUNTER — Other Ambulatory Visit: Payer: Self-pay

## 2020-09-21 ENCOUNTER — Ambulatory Visit: Payer: Self-pay | Attending: Physician Assistant | Admitting: Physician Assistant

## 2020-09-21 VITALS — BP 162/85 | HR 75 | Wt 228.0 lb

## 2020-09-21 DIAGNOSIS — Z8619 Personal history of other infectious and parasitic diseases: Secondary | ICD-10-CM

## 2020-09-21 DIAGNOSIS — N950 Postmenopausal bleeding: Secondary | ICD-10-CM

## 2020-09-21 DIAGNOSIS — R8281 Pyuria: Secondary | ICD-10-CM

## 2020-09-21 DIAGNOSIS — I10 Essential (primary) hypertension: Secondary | ICD-10-CM

## 2020-09-21 DIAGNOSIS — Z789 Other specified health status: Secondary | ICD-10-CM

## 2020-09-21 LAB — POCT URINALYSIS DIP (CLINITEK)
Bilirubin, UA: NEGATIVE
Glucose, UA: NEGATIVE mg/dL
Ketones, POC UA: NEGATIVE mg/dL
Nitrite, UA: NEGATIVE
Spec Grav, UA: >=1.03 — AB (ref 1.010–1.025)
Urobilinogen, UA: 0.2 E.U./dL
pH, UA: 5.5 (ref 5.0–8.0)

## 2020-09-21 MED ORDER — AMLODIPINE BESYLATE 10 MG PO TABS: 10.0000 mg | ORAL_TABLET | Freq: Every day | ORAL | 3 refills | Status: DC

## 2020-09-21 MED ORDER — NITROFURANTOIN MONOHYD MACRO 100 MG PO CAPS: 100.0000 mg | ORAL_CAPSULE | Freq: Two times a day (BID) | ORAL | 0 refills | Status: DC

## 2020-09-21 MED ORDER — SPIRONOLACTONE 50 MG PO TABS: 50.0000 mg | ORAL_TABLET | Freq: Every day | ORAL | 3 refills | Status: DC

## 2020-09-21 NOTE — Progress Notes (Signed)
Mild vaginal bleeding about 6 months on and off

## 2020-09-21 NOTE — Patient Instructions (Addendum)
Drink 80-100 ounces water daily   Hemorragia posmenopusica Postmenopausal Bleeding La hemorragia posmenopusica es cualquier sangrado que ocurre despus de la menopausia. La menopausia es un momento de la vida de una mujer en el que se detienen los perodos [place]. El mdico debe controlar cualquier tipo de sangrado que tenga despus de la menopausia. El tratamiento depender de la causa. Este tipo de hemorragia puede ser causada por:  El uso de hormonas durante la menopausia.  Cantidades bajas o altas de hormonas femeninas en el cuerpo. Esto puede hacer que el revestimiento del utero se vuelva demasiado delgado o demasiado grueso.  Cncer.  Crecimientos en el tero que no son cncer. Siga estas instrucciones en su casa:  Controle si hay algn cambio en sus sntomas. Informe a su mdico acerca de los cambios.  Evite usar tampones y Electrical engineer duchas vaginales como se lo haya indicado el mdico.  Cmbiese las toallas higinicas de forma regular.  Hgase exmenes plvicos regulares. Esto incluye las pruebas de [place].  Tome comprimidos de hierro como se lo haya indicado el mdico.  Use los medicamentos de venta libre y los recetados solamente como se lo haya indicado el mdico.  Cumpla con todas las visitas de seguimiento.   Comunquese con un mdico si:  Tiene un nuevo sangrado de la vagina despus de la menopausia.  Siente dolor en el vientre (abdomen). Solicite ayuda de inmediato si:  Tiene fiebre o escalofros.  Tiene un dolor muy intenso con sangrado.  Elimina grumos de sangre (cogulos de [place]) por la vagina.  Tiene mucho sangrado y: ? [place] ms de 1 compresa por hora. ? Es la primera vez que tiene un sangrado de [place] tipo.  Tiene dolores de [place].  Se siente mareado o como si se fuera a desmayar. Resumen  El mdico debe controlar cualquier tipo de sangrado que tenga despus de la menopausia.  Evite usar tampones o duchas vaginales.  Hgase exmenes  plvicos regulares. Esto incluye las pruebas de [place].  Pngase en contacto con un mdico si tiene sangrado o dolor nuevo en el vientre.  Controle si hay algn cambio en sus sntomas. Informe a su mdico acerca de los cambios. Esta informacin no tiene Marine scientist el consejo del mdico. Asegrese de hacerle al mdico cualquier pregunta que tenga. Document Revised: 02/29/2020 Document Reviewed: 02/29/2020 Elsevier Patient Education  [place].

## 2020-09-21 NOTE — Progress Notes (Signed)
Anita [name], is a 65 y.o. female  ZOX:[id number]  [id number]  DOB - Mar 21, 1956  Subjective:  Chief Complaint and HPI: Anita [name] is a 65 y.o. female here today with 6 month h/o vaginal spotting.  This is intermittent and seems to occur 1-2 times every week.  Not heavy.  Does not need to wear a pad.  No dizziness/CP/vision changes.  No tinnitus or PICA.  Went through menopause around age 3.  No fever.  No abdominal pain.  No vaginal discharge or dysuria.   Taking spironolactone but not amlodipine.    ROS:   Constitutional:  No f/c, No night sweats, No unexplained weight loss. EENT:  No vision changes, No blurry vision, No hearing changes. No mouth, throat, or ear problems.  Respiratory: No cough, No SOB Cardiac: No CP, no palpitations GI:  No abd pain, No N/V/D. GU: No Urinary s/sx Musculoskeletal: No joint pain Neuro: No headache, no dizziness, no motor weakness.  Skin: No rash Endocrine:  No polydipsia. No polyuria.  Psych: Denies SI/HI  No problems updated.  ALLERGIES: Allergies  Allergen Reactions  . Chlorthalidone     hypokalemia  . Lisinopril Other (See Comments)    Chills and arm pain    PAST MEDICAL HISTORY: Past Medical History:  Diagnosis Date  . ABDOMINAL PAIN 05/05/2009   Qualifier: Diagnosis of  By: Jorene Minors, Scott    . Adjustment disorder with depressed mood 08/30/2009   Qualifier: Diagnosis of  By: Jorene Minors, Scott    . ALLERGIC RHINITIS 12/04/2009   Qualifier: Diagnosis of  By: Jorene Minors, Scott    . Allergy   . Anxiety   . Aortic stenosis    mild by echo 03/2019  . CHOLELITHIASIS 05/30/2009   Qualifier: Diagnosis of  By: Jorene Minors, Scott    . COLONIC POLYPS, HX OF 05/09/2010   Qualifier: Diagnosis of  By: Jorene Minors, Scott    . Depression   . Essential hypertension, benign 05/05/2009   Qualifier: Diagnosis of  By: Jorene Minors, Scott    . GERD 06/12/2009   Qualifier: Diagnosis of  By: Jorene Minors, Scott    . Heart murmur     Pacemaker  . Hypertension   . KNEE PAIN 06/12/2009   Qualifier: Diagnosis of  By: Jorene Minors, Scott    . LIVER MASS 05/30/2009   Qualifier: Diagnosis of  By: Jorene Minors, Scott    . OBESITY 10/24/2010   Qualifier: Diagnosis of  By: Keenan Bachelor RN, Rip Harbour    . Sleep apnea    sleep study on hold  . TINNITUS, CHRONIC, RIGHT 09/07/2010   Qualifier: Diagnosis of  By: Amil Amen MD, Benjamine Mola    . TOOTH LOSS 09/07/2010   Qualifier: Diagnosis of  By: Amil Amen MD, Benjamine Mola    . 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 VARICOSE VEINS, LOWER EXTREMITIES 05/05/2009   Qualifier: Diagnosis of  By: Versie Starks      MEDICATIONS AT HOME: Prior to Admission medications   Medication Sig Start Date End Date Taking? Authorizing Provider  acetaminophen (TYLENOL) 500 MG tablet Take 500 mg by mouth every 6 (six) hours as needed for moderate pain or headache.    [provider]  amLODipine (NORVASC) 10 MG tablet Take 1 tablet (10 mg total) by mouth daily. 09/21/20   Argentina Donovan, PA-C  escitalopram (LEXAPRO) 10 MG tablet Take 5 mg by mouth daily. Patient not taking: No  sig reported    [provider]  methocarbamol (ROBAXIN) 500 MG tablet Take 1 tablet (500 mg total) by mouth every 8 (eight) hours as needed for muscle spasms. Patient not taking: No sig reported 03/04/20   Davonna Belling, MD  spironolactone (ALDACTONE) 50 MG tablet Take 1 tablet (50 mg total) by mouth daily. 09/21/20   Argentina Donovan, PA-C     Objective:  EXAM:   Vitals:   09/21/20 0907  BP: (!) 162/85  Pulse: 75  SpO2: 97%  Weight: 228 lb (103.4 kg)    General appearance : A&OX3. NAD. Non-toxic-appearing HEENT: Atraumatic and Normocephalic.  PERRLA. EOM intact.  Chest/Lungs:  Breathing-non-labored, Good air entry bilaterally, breath sounds normal without rales, rhonchi, or wheezing  CVS: S1 S2 regular, no murmurs, gallops, rubs  Abdomen: Bowel sounds  present, Non tender and not distended with no gaurding, rigidity or rebound. Extremities: Bilateral Lower Ext shows no edema, both legs are warm to touch with = pulse throughout Neurology:  CN II-XII grossly intact, Non focal.   Psych:  TP linear. J/I fair. Normal speech. Appropriate eye contact and affect.  Skin:  No Rash  Data Review Lab Results  Component Value Date   HGBA1C 5.4 % 01/15/2013     Assessment & Plan   1. Post-menopausal bleeding - Cervicovaginal ancillary only - POCT URINALYSIS DIP (CLINITEK) - Comprehensive metabolic panel - CBC with Differential/Platelet - Ambulatory referral to Gynecology  2. Essential hypertension Resume amlodipine/not controlled - Comprehensive metabolic panel - CBC with Differential/Platelet - amLODipine (NORVASC) 10 MG tablet; Take 1 tablet (10 mg total) by mouth daily.  Dispense: 90 tablet; Refill: 3 - spironolactone (ALDACTONE) 50 MG tablet; Take 1 tablet (50 mg total) by mouth daily.  Dispense: 90 tablet; Refill: 3  3. H/O trichomoniasis - Cervicovaginal ancillary only - Ambulatory referral to Gynecology  4. Language barrier AMN interpreters used and additional time performing visit was required.  5.  Pyuria-macrobid 100mg  bidx5days and urine culture   Patient have been counseled extensively about nutrition and exercise  Return in about 3 months (around 12/19/2020) for PCP;  chronic conditions.  The patient was given clear instructions to go to ER or return to medical center if symptoms don't improve, worsen or new problems develop. The patient verbalized understanding. The patient was told to call to get lab results if they haven't heard anything in the next week.     Freeman Caldron, PA-C [facility] [place], [place]   09/21/2020, 9:21 AM

## 2020-09-22 LAB — COMPREHENSIVE METABOLIC PANEL
ALT: 16 IU/L (ref 0–32)
AST: 18 IU/L (ref 0–40)
Albumin/Globulin Ratio: 1.6 (ref 1.2–2.2)
Albumin: 4.5 g/dL (ref 3.8–4.8)
Alkaline Phosphatase: 63 IU/L (ref 44–121)
BUN/Creatinine Ratio: 18 (ref 12–28)
BUN: 16 mg/dL (ref 8–27)
Bilirubin Total: 0.5 mg/dL (ref 0.0–1.2)
CO2: 20 mmol/L (ref 20–29)
Calcium: 9.4 mg/dL (ref 8.7–10.3)
Chloride: 105 mmol/L (ref 96–106)
Creatinine, Ser: 0.87 mg/dL (ref 0.57–1.00)
GFR calc Af Amer: 80 mL/min/{1.73_m2} (ref >59)
GFR calc non Af Amer: 70 mL/min/{1.73_m2} (ref >59)
Globulin, Total: 2.9 g/dL (ref 1.5–4.5)
Glucose: 106 mg/dL — ABNORMAL HIGH (ref 65–99)
Potassium: 4.8 mmol/L (ref 3.5–5.2)
Sodium: 142 mmol/L (ref 134–144)
Total Protein: 7.4 g/dL (ref 6.0–8.5)

## 2020-09-22 LAB — CERVICOVAGINAL ANCILLARY ONLY
Bacterial Vaginitis (gardnerella): POSITIVE — AB
Candida Glabrata: NEGATIVE
Candida Vaginitis: NEGATIVE
Chlamydia: NEGATIVE
Comment: NEGATIVE
Comment: NEGATIVE
Comment: NEGATIVE
Comment: NEGATIVE
Comment: NEGATIVE
Comment: NORMAL
Neisseria Gonorrhea: NEGATIVE
Trichomonas: POSITIVE — AB

## 2020-09-22 LAB — CBC WITH DIFFERENTIAL/PLATELET
Basophils Absolute: 0 10*3/uL (ref 0.0–0.2)
Basos: 1 %
EOS (ABSOLUTE): 0.2 10*3/uL (ref 0.0–0.4)
Eos: 4 %
Hematocrit: 43.2 % (ref 34.0–46.6)
Hemoglobin: 14.6 g/dL (ref 11.1–15.9)
Immature Grans (Abs): 0.1 10*3/uL (ref 0.0–0.1)
Immature Granulocytes: 1 %
Lymphocytes Absolute: 1.9 10*3/uL (ref 0.7–3.1)
Lymphs: 33 %
MCH: 30.6 pg (ref 26.6–33.0)
MCHC: 33.8 g/dL (ref 31.5–35.7)
MCV: 91 fL (ref 79–97)
Monocytes Absolute: 0.4 10*3/uL (ref 0.1–0.9)
Monocytes: 6 %
Neutrophils Absolute: 3.3 10*3/uL (ref 1.4–7.0)
Neutrophils: 55 %
Platelets: 206 10*3/uL (ref 150–450)
RBC: 4.77 x10E6/uL (ref 3.77–5.28)
RDW: 12.9 % (ref 11.7–15.4)
WBC: 5.9 10*3/uL (ref 3.4–10.8)

## 2020-09-23 LAB — URINE CULTURE

## 2020-09-26 ENCOUNTER — Telehealth: Payer: Self-pay | Admitting: Nurse Practitioner

## 2020-09-26 NOTE — Telephone Encounter (Signed)
Copied from [place] [id number]. Topic: Referral - Status >> Sep 26, 2020 11:06 AM Scherrie Gerlach wrote: Reason for CRM: daughter called and states the women center at [place] told her they can not accept her becausehe does not have insurance. Daughter states they are working on getting the OC. Also pt did NOT know why she had the referral.  No one called her with results. Please call [phone].   pt will need spanish interpreter.  Patient last saw Levada Dy on 2/10. Please advise.

## 2020-09-27 ENCOUNTER — Other Ambulatory Visit: Payer: Self-pay | Admitting: Physician Assistant

## 2020-09-27 MED ORDER — FLUCONAZOLE 150 MG PO TABS
150.0000 mg | ORAL_TABLET | Freq: Once | ORAL | 0 refills | Status: AC
Start: 2020-09-27 — End: 2020-09-27

## 2020-09-27 MED ORDER — METRONIDAZOLE 500 MG PO TABS
500.0000 mg | ORAL_TABLET | Freq: Two times a day (BID) | ORAL | 0 refills | Status: DC
Start: 2020-09-27 — End: 2020-10-25

## 2020-10-03 NOTE — Telephone Encounter (Signed)
Spoke to patient to inform referral for Gynecology was placed due to postmenopausal bleeding.  Patient understood. Spanish pacific interpreter assist w/ the call.

## 2020-10-09 ENCOUNTER — Ambulatory Visit (INDEPENDENT_AMBULATORY_CARE_PROVIDER_SITE_OTHER): Payer: Self-pay

## 2020-10-09 DIAGNOSIS — R55 Syncope and collapse: Secondary | ICD-10-CM

## 2020-10-10 LAB — CUP PACEART REMOTE DEVICE CHECK
Battery Remaining Longevity: 147 mo
Battery Voltage: 3.04 V
Brady Statistic AP VP Percent: 0.06 %
Brady Statistic AP VS Percent: 86.82 %
Brady Statistic AS VP Percent: 0.08 %
Brady Statistic AS VS Percent: 13.04 %
Brady Statistic RA Percent Paced: 86.75 %
Brady Statistic RV Percent Paced: 0.14 %
Date Time Interrogation Session: 20220228005227
Implantable Lead Implant Date: 20200817
Implantable Lead Implant Date: 20200828
Implantable Lead Location: 753859
Implantable Lead Location: 753860
Implantable Lead Model: 5076
Implantable Lead Model: 5076
Implantable Pulse Generator Implant Date: 20200817
Lead Channel Impedance Value: 342 Ohm
Lead Channel Impedance Value: 380 Ohm
Lead Channel Impedance Value: 399 Ohm
Lead Channel Impedance Value: 475 Ohm
Lead Channel Pacing Threshold Amplitude: 0.5 V
Lead Channel Pacing Threshold Amplitude: 0.75 V
Lead Channel Pacing Threshold Pulse Width: 0.4 ms
Lead Channel Pacing Threshold Pulse Width: 0.4 ms
Lead Channel Sensing Intrinsic Amplitude: 11.25 mV
Lead Channel Sensing Intrinsic Amplitude: 11.25 mV
Lead Channel Sensing Intrinsic Amplitude: 2.125 mV
Lead Channel Sensing Intrinsic Amplitude: 2.125 mV
Lead Channel Setting Pacing Amplitude: 1.5 V
Lead Channel Setting Pacing Amplitude: 2.5 V
Lead Channel Setting Pacing Pulse Width: 0.4 ms
Lead Channel Setting Sensing Sensitivity: 1.2 mV

## 2020-10-11 ENCOUNTER — Ambulatory Visit: Payer: Self-pay | Admitting: Gastroenterology

## 2020-10-17 NOTE — Progress Notes (Signed)
Remote pacemaker transmission.   

## 2020-10-25 ENCOUNTER — Telehealth: Payer: Self-pay

## 2020-10-25 ENCOUNTER — Ambulatory Visit (INDEPENDENT_AMBULATORY_CARE_PROVIDER_SITE_OTHER): Payer: Self-pay | Admitting: Nurse Practitioner

## 2020-10-25 ENCOUNTER — Encounter: Payer: Self-pay | Admitting: Nurse Practitioner

## 2020-10-25 VITALS — BP 138/80 | HR 67 | Ht 62.0 in | Wt 231.0 lb

## 2020-10-25 DIAGNOSIS — K219 Gastro-esophageal reflux disease without esophagitis: Secondary | ICD-10-CM

## 2020-10-25 DIAGNOSIS — R1310 Dysphagia, unspecified: Secondary | ICD-10-CM

## 2020-10-25 DIAGNOSIS — Z1211 Encounter for screening for malignant neoplasm of colon: Secondary | ICD-10-CM

## 2020-10-25 DIAGNOSIS — Z950 Presence of cardiac pacemaker: Secondary | ICD-10-CM

## 2020-10-25 DIAGNOSIS — Z8619 Personal history of other infectious and parasitic diseases: Secondary | ICD-10-CM

## 2020-10-25 NOTE — Telephone Encounter (Signed)
pts daughter called asking to speak w/raquel will route to her

## 2020-10-25 NOTE — Patient Instructions (Signed)
  Si tiene 65 aos o ms, su ndice de [employer] corporal debe estar entre 23 y 70. Su ndice de masa corporal es de 42,25 kg/m. Si esto est fuera del rango mencionado anteriormente, considere hacer un seguimiento con su proveedor de Midwife.  Si tiene 53 aos o menos, su ndice de [employer] corporal debe estar entre 96 y 1. Su ndice de masa corporal es de 42,25 kg/m. Si esto est fuera del rango mencionado anteriormente, considere hacer un seguimiento con su proveedor de Midwife.  PROCEDIMIENTOS:  Se le ha programado una endoscopia y [place] colonoscopia. Siga las instrucciones escritas que se le dieron en su visita de hoy. Recoja sus suministros de preparacin en la farmacia dentro de los prximos 1 a 3 das. Si [place] inhaladores (aunque solo sea necesario), trigalos el da de su procedimiento.  Fue genial verte hoy! Gracias por confiarme su atencin y [place].  Noralyn Pick, CRNP

## 2020-10-25 NOTE — Telephone Encounter (Signed)
Patient and daughter unable to remember the indication for amlodipine and spironolactone. Her ENT doctor told patient that spironolactone was not for hypertension. We clarify indication ans use for both medication. Encouraged patient to call back I anything else needed.

## 2020-10-25 NOTE — Progress Notes (Signed)
10/25/2020 Anita [name] [id number] [birth year]   CHIEF COMPLAINT: Heartburn   HISTORY OF PRESENT ILLNESS:  Anita [name] is a 65 year old female with a past medical history of sinus node dysfunction s/p pacemaker, mild AS, GERD and H. Pylori. She denies having sleep apnea. S/P cholecystectomy 12 years ago. She speaks Spanish therefore she is accompanied by a [place] interpreter. She presents to our office today for further evaluation regarding GERD symptoms. Communication was very challenging despite the assistance with an interpreter. The patient was vague with her responses which were delayed. I asked for her daughter who was in the waiting room to join her mother in the exam room.  Her daughter was able to assist with some of her mother's symptoms history. She complains of having heartburn 2 or 3 nights weekly for the past month. She has difficulty swallowing for the past 3 months. Food briefly gets stuck in her esophagus which passes spontaneously which occurs possibly once monthly. She feels food goes inside her nose at times. She has some upper abdominal discomfort but she is unable to explain the quality of her pain or how often it occurs. She is passing a light brown yellowish soft to hard stool daily. No rectal bleeding or black stools. Her sister had stomach or colon cancer, further details are unclear. She is scheduled to see a gynecologist due to having pelvic pain, vaginal spotting and UTI symptoms.  She underwent an EGD and colonoscopy 05/03/2010. The EGD showed H. Pylori gastritis and the colonoscopy showed a polypoid polyp removed from the descending colon.  She has a pacemake secondary to sinus node dysfunction followed by cardiologist Dr. Lovena Le. She denies having any CP, palpitations or SOB. No history of CVA or lung disease.      CBC Latest Ref Rng & Units 09/21/2020 03/04/2020 05/27/2019  WBC 3.4 - 10.8 x10E3/uL 5.9 7.9 5.9  Hemoglobin 11.1 - 15.9 g/dL 14.6  15.3(H) 14.6  Hematocrit 34.0 - 46.6 % 43.2 45.6 42.7  Platelets 150 - 450 x10E3/uL 206 248 228   CMP Latest Ref Rng & Units 09/21/2020 03/04/2020 06/11/2019  Glucose 65 - 99 mg/dL 106(H) 128(H) 103(H)  BUN 8 - 27 mg/dL 16 19 14   Creatinine 0.57 - 1.00 mg/dL 0.87 0.91 0.85  Sodium 134 - 144 mmol/L 142 137 138  Potassium 3.5 - 5.2 mmol/L 4.8 4.5 4.5  Chloride 96 - 106 mmol/L 105 103 103  CO2 20 - 29 mmol/L 20 22 21   Calcium 8.7 - 10.3 mg/dL 9.4 9.5 9.5  Total Protein 6.0 - 8.5 g/dL 7.4 7.8 -  Total Bilirubin 0.0 - 1.2 mg/dL 0.5 0.7 -  Alkaline Phos 44 - 121 IU/L 63 57 -  AST 0 - 40 IU/L 18 23 -  ALT 0 - 32 IU/L 16 21 -   Abdominal sonogram 03/04/2020: Post cholecystectomy. Probable fatty infiltration of liver as above. Inadequate visualization of intrahepatic detail due to body habitus and sound attenuation; if better intra hepatic visualization is required recommend MR or CT. Pancreas and distal abdominal aorta obscured by bowel gas  EGD and colonoscopy by Dr. Deatra Ina 05/03/2010: 1. Colon, polyp(s), descending :  - POLYPOID COLONIC MUCOSA WITH BENIGN LYMPHOID AGGREGATE  - NEGATIVE FOR DYSPLASIA/ADENOMA  2. 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   ECHO 03/24/2019: 1. The left ventricle has normal systolic function with an ejection fraction of 60-65%. The cavity size was normal. There is mildly increased  left ventricular wall thickness. Left ventricular diastolic Doppler parameters are consistent with pseudonormalization. Elevated mean left atrial pressure. 2. The right ventricle has normal systolic function. The cavity was normal. There is no increase in right ventricular wall thickness. Right ventricular systolic pressure is normal with an estimated pressure of 19.6 mmHg. 3. Left atrial size was mildly dilated. 4. Right atrial size was mildly dilated. 5. There is mild mitral annular calcification present. 6. The tricuspid valve  is grossly normal. 7. Mild stenosis of the aortic valve. 8. The aorta is normal in size and structure   Past Medical History:  Diagnosis Date  . ABDOMINAL PAIN 05/05/2009   Qualifier: Diagnosis of  By: Jorene Minors, Scott    . Adjustment disorder with depressed mood 08/30/2009   Qualifier: Diagnosis of  By: Jorene Minors, Scott    . ALLERGIC RHINITIS 12/04/2009   Qualifier: Diagnosis of  By: Jorene Minors, Scott    . Allergy   . Anxiety   . Aortic stenosis    mild by echo 03/2019  . CHOLELITHIASIS 05/30/2009   Qualifier: Diagnosis of  By: Jorene Minors, Scott    . COLONIC POLYPS, HX OF 05/09/2010   Qualifier: Diagnosis of  By: Jorene Minors, Scott    . Depression   . Essential hypertension, benign 05/05/2009   Qualifier: Diagnosis of  By: Jorene Minors, Scott    . GERD 06/12/2009   Qualifier: Diagnosis of  By: Jorene Minors, Scott    . Heart murmur    Pacemaker  . Hypertension   . KNEE PAIN 06/12/2009   Qualifier: Diagnosis of  By: Jorene Minors, Scott    . LIVER MASS 05/30/2009   Qualifier: Diagnosis of  By: Jorene Minors, Scott    . OBESITY 10/24/2010   Qualifier: Diagnosis of  By: Keenan Bachelor RN, Rip Harbour    . Sleep apnea    sleep study on hold  . TINNITUS, CHRONIC, RIGHT 09/07/2010   Qualifier: Diagnosis of  By: Amil Amen MD, Benjamine Mola    . TOOTH LOSS 09/07/2010   Qualifier: Diagnosis of  By: Amil Amen MD, Benjamine Mola    . 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 VARICOSE VEINS, LOWER EXTREMITIES 05/05/2009   Qualifier: Diagnosis of  By: Versie Starks     Past Surgical History:  Procedure Laterality Date  . ABDOMINAL HYSTERECTOMY    . APPENDECTOMY    . CESAREAN SECTION    . CHOLECYSTECTOMY    . COLONOSCOPY  2011  . LEAD REVISION/REPAIR N/A 04/09/2019   Procedure: LEAD REVISION/REPAIR;  Surgeon: Evans Lance, MD;  Location: [facility];  Service: Cardiovascular;  Laterality: N/A;  . PACEMAKER IMPLANT N/A 03/29/2019   Procedure: PACEMAKER  IMPLANT;  Surgeon: Evans Lance, MD;  Location: [facility];  Service: Cardiovascular;  Laterality: N/A;   Social History: Nonsmoker. No alcohol or drug use.   Family History: family history includes Cancer in her sister; Diabetes in her paternal grandmother; Hyperlipidemia in her brother, brother, brother, brother, brother, sister, sister, and sister; Hypertension in her brother, brother, brother, brother, brother, paternal grandmother, sister, sister, and sister; Migraines in her daughter; Stomach cancer in her sister.   Allergies  Allergen Reactions  . Chlorthalidone     hypokalemia  . Lisinopril Other (See Comments)    Chills and arm pain      Outpatient Encounter Medications as of 10/25/2020  Medication Sig  . acetaminophen (TYLENOL) 500 MG tablet  Take 500 mg by mouth every 6 (six) hours as needed for moderate pain or headache.  Marland Kitchen amLODipine (NORVASC) 10 MG tablet Take 1 tablet (10 mg total) by mouth daily.  Marland Kitchen escitalopram (LEXAPRO) 10 MG tablet Take 5 mg by mouth daily. (Patient not taking: No sig reported)  . methocarbamol (ROBAXIN) 500 MG tablet Take 1 tablet (500 mg total) by mouth every 8 (eight) hours as needed for muscle spasms. (Patient not taking: No sig reported)  . metroNIDAZOLE (FLAGYL) 500 MG tablet Take 1 tablet (500 mg total) by mouth 2 (two) times daily.  . nitrofurantoin, macrocrystal-monohydrate, (MACROBID) 100 MG capsule Take 1 capsule (100 mg total) by mouth 2 (two) times daily.  Marland Kitchen spironolactone (ALDACTONE) 50 MG tablet Take 1 tablet (50 mg total) by mouth daily.   No facility-administered encounter medications on file as of 10/25/2020.    REVIEW OF SYSTEMS: All other systems reviewed and negative except where noted in the History of Present Illness.  PHYSICAL EXAM: BP 138/80   Pulse 67   Ht 5\' 2"  (1.575 m)   Wt 231 lb (104.8 kg)   SpO2 98%   BMI 42.25 kg/m   General: Obese 65 year old female in no acute distress. Head: Normocephalic and  atraumatic. Eyes:  Sclerae non-icteric, conjunctive pink. Ears: Normal auditory acuity. Mouth: Dentition intact. No ulcers or lesions.  Neck: Supple, no lymphadenopathy or thyromegaly.  Lungs: Clear bilaterally to auscultation without wheezes, crackles or rhonchi. Heart: Regular rate and rhythm. Soft systolic murmur. No rub or gallop appreciated.  Abdomen: Soft, nontender, non distended. No masses. No hepatosplenomegaly. Normoactive bowel sounds x 4 quadrants.  Rectal: Deferred.  Musculoskeletal: Symmetrical with no gross deformities. Skin: Warm and dry. No rash or lesions on visible extremities. Extremities: No edema. Neurological: Alert oriented x 4, no focal deficits.  Psychological:  Alert and cooperative. Normal mood and affect.  ASSESSMENT AND PLAN:  82. 65 year old female with GERD and dysphagia with history of H. Pylori in 2011 -EGD benefits and risks discussed including risk with sedation, risk of bleeding, perforation and infection   2. Screening colonoscopy  -Colonoscopy benefits and risks discussed including risk with sedation, risk of bleeding, perforation and infection   3. Sinus node dysfunction s/p pacemaker placement   Further recommendations to be determined after the above evaluation completed            CC:  Gildardo Pounds, NP

## 2020-10-26 NOTE — Progress Notes (Signed)
____________________________________________________________  Attending physician addendum:  Thank you for sending this case to me. I have reviewed the entire note and agree with the plan.  Yes, she can be on my schedule since she was originally scheduled for procedure with me.  Wilfrid Lund, MD  ____________________________________________________________

## 2020-11-01 ENCOUNTER — Encounter: Payer: Self-pay | Admitting: Internal Medicine

## 2020-11-01 ENCOUNTER — Other Ambulatory Visit: Payer: Self-pay

## 2020-11-01 NOTE — Progress Notes (Signed)
Patient is already established with [facility].  Discussed we are primary care as well and while we would be happy to see her, she would need to choose only one primary care provider for her safety/optimal care.    She prefers to stay with [facility] clinic and will follow up with them.

## 2020-11-06 ENCOUNTER — Encounter: Payer: Self-pay | Admitting: Obstetrics and Gynecology

## 2020-11-09 ENCOUNTER — Other Ambulatory Visit: Payer: Self-pay

## 2020-11-09 ENCOUNTER — Other Ambulatory Visit (HOSPITAL_COMMUNITY)
Admission: RE | Admit: 2020-11-09 | Discharge: 2020-11-09 | Disposition: A | Discharge status: Home / Self Care | Payer: Self-pay | Source: Ambulatory Visit | Attending: Obstetrics and Gynecology | Admitting: Obstetrics and Gynecology

## 2020-11-09 ENCOUNTER — Encounter: Payer: Self-pay | Admitting: Obstetrics and Gynecology

## 2020-11-09 ENCOUNTER — Ambulatory Visit (INDEPENDENT_AMBULATORY_CARE_PROVIDER_SITE_OTHER): Payer: Self-pay | Admitting: Obstetrics and Gynecology

## 2020-11-09 VITALS — BP 154/64 | HR 68 | Wt 231.3 lb

## 2020-11-09 DIAGNOSIS — Z8619 Personal history of other infectious and parasitic diseases: Secondary | ICD-10-CM

## 2020-11-09 DIAGNOSIS — N760 Acute vaginitis: Secondary | ICD-10-CM

## 2020-11-09 DIAGNOSIS — Z8742 Personal history of other diseases of the female genital tract: Secondary | ICD-10-CM

## 2020-11-09 DIAGNOSIS — R300 Dysuria: Secondary | ICD-10-CM

## 2020-11-09 LAB — POCT URINALYSIS DIP (DEVICE)
Bilirubin Urine: NEGATIVE
Glucose, UA: NEGATIVE mg/dL
Ketones, ur: NEGATIVE mg/dL
Nitrite: NEGATIVE
Protein, ur: NEGATIVE mg/dL
Specific Gravity, Urine: 1.025 (ref 1.005–1.030)
Urobilinogen, UA: 0.2 mg/dL (ref 0.0–1.0)
pH: 5.5 (ref 5.0–8.0)

## 2020-11-09 NOTE — Progress Notes (Signed)
Obstetrics and Gynecology New Patient Consult   Appointment Date: 11/09/2020  OBGYN Clinic: [facility] for [facility]  Primary Care Provider: Gildardo Pounds  Referring Provider: Argentina Donovan, PA-C  Chief Complaint: post menopausal spotting  History of Present Illness: Anita [name] is a 66 y.o. Hispanic G5P5 (LMP: 65 y/o), seen for the above chief complaint.  She is seen in consultation from Freeman Caldron for vaginal spotting in early to mid February 2022. She had what I believe is a self swab at that PCP visit since no exam is documented; cervicovag swab showed trich and BV. Her PCP sent in flagyl for a week and diflucan.  She denies any more spotting and states she has not had any bleeding or spotting since menopause at age 55  Review of Systems: A comprehensive review of systems was negative.    Patient Active Problem List   Diagnosis Date Noted  . Sinus node dysfunction ([place]) 07/12/2019  . Pacemaker 07/12/2019  . Complication associated with cardiac pacemaker lead 04/09/2019  . 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 Syncope 03/27/2019  . Aortic stenosis   . BMI 45.0-49.9, adult ([place]) 10/19/2014  . Venous stasis dermatitis of left lower extremity 10/19/2014  . OBESITY 10/24/2010  . TINNITUS, CHRONIC, RIGHT 09/07/2010  . TOOTH LOSS 09/07/2010  . COLONIC POLYPS, HX OF 05/09/2010  . ALLERGIC RHINITIS 12/04/2009  . 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 ADJUSTMENT DISORDER WITH DEPRESSED MOOD 08/30/2009  . GERD 06/12/2009  . KNEE PAIN 06/12/2009  . LIVER MASS 05/30/2009  . CHOLELITHIASIS 05/30/2009  . ESSENTIAL HYPERTENSION, BENIGN 05/05/2009  . 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 ABDOMINAL PAIN 05/05/2009    Past Medical History:  Past Medical History:  Diagnosis Date  . ABDOMINAL PAIN 05/05/2009   Qualifier: Diagnosis of  By: Jorene Minors, Scott    . Adjustment disorder with depressed mood 08/30/2009   Qualifier:  Diagnosis of  By: Jorene Minors, Scott    . ALLERGIC RHINITIS 12/04/2009   Qualifier: Diagnosis of  By: Jorene Minors, Scott    . Allergy   . Anxiety   . Aortic stenosis    mild by echo 03/2019  . CHOLELITHIASIS 05/30/2009   Qualifier: Diagnosis of  By: Jorene Minors, Scott    . COLONIC POLYPS, HX OF 05/09/2010   Qualifier: Diagnosis of  By: Jorene Minors, Scott    . Depression   . Essential hypertension, benign 05/05/2009   Qualifier: Diagnosis of  By: Jorene Minors, Scott    . GERD 06/12/2009   Qualifier: Diagnosis of  By: Jorene Minors, Scott    . Heart murmur    Pacemaker  . Hypertension   . KNEE PAIN 06/12/2009   Qualifier: Diagnosis of  By: Jorene Minors, Scott    . LIVER MASS 05/30/2009   Qualifier: Diagnosis of  By: Jorene Minors, Scott    . OBESITY 10/24/2010   Qualifier: Diagnosis of  By: Keenan Bachelor RN, Rip Harbour    . Sleep apnea    sleep study on hold  . TINNITUS, CHRONIC, RIGHT 09/07/2010   Qualifier: Diagnosis of  By: Amil Amen MD, Benjamine Mola    . TOOTH LOSS 09/07/2010   Qualifier: Diagnosis of  By: Amil Amen MD, Benjamine Mola    . 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 VARICOSE VEINS, LOWER EXTREMITIES 05/05/2009   Qualifier: Diagnosis of  By: Versie Starks      Past Surgical History:  Past Surgical  History:  Procedure Laterality Date  . APPENDECTOMY    . CESAREAN SECTION WITH BILATERAL TUBAL LIGATION    . CHOLECYSTECTOMY    . COLONOSCOPY  2011  . LEAD REVISION/REPAIR N/A 04/09/2019   Procedure: LEAD REVISION/REPAIR;  Surgeon: Evans Lance, MD;  Location: [place] [facility];  Service: Cardiovascular;  Laterality: N/A;  . PACEMAKER IMPLANT N/A 03/29/2019   Procedure: PACEMAKER IMPLANT;  Surgeon: Evans Lance, MD;  Location: [place] [facility];  Service: Cardiovascular;  Laterality: N/A;    Past Obstetrical History:  OB History  Gravida Para Term Preterm AB Living  5         5  SAB IAB Ectopic Multiple Live Births          5    #  Outcome Date GA Lbr Len/2nd Weight Sex Delivery Anes PTL Lv  5 Gravida           4 Gravida           3 Gravida           2 Gravida           1 [place]             Obstetric Comments  Last delivery was a c-section    Past Gynecological History: As per HPI. History of Pap Smear(s): Yes.   Last pap unknown to patient but in the system there is a 2011 negative pap History of HRT use: No.   Social History:  Social History   Socioeconomic History  . Marital status: Married    Spouse name: Freida Busman  . Number of children: 5  . Years of education: 6th grade  . Highest education level: Not on file  Occupational History  . Occupation: homemaker  Tobacco Use  . Smoking status: Never Smoker  . Smokeless tobacco: Never Used  Vaping Use  . Vaping Use: Never used  Substance and Sexual Activity  . Alcohol use: No    Alcohol/week: 0.0 standard drinks  . Drug use: No  . Sexual activity: Not Currently  Other Topics Concern  . Not on file  Social History Narrative   From [place], [place]. Came to the [place] 2002. Lives with her husband. Their son and grandson live nearby.   Social Determinants of Health   Financial Resource Strain: Not on file  Food Insecurity: No Food Insecurity  . Worried About Charity fundraiser in the Last Year: Never true  . Ran Out of Food in the Last Year: Never true  Transportation Needs: No Transportation Needs  . Lack of Transportation (Medical): No  . Lack of Transportation (Non-Medical): No  Physical Activity: Not on file  Stress: Not on file  Social Connections: Not on file  Intimate Partner Violence: Not on file    Family History:  Family History  Problem Relation Age of Onset  . Hypertension Sister   . Hyperlipidemia Sister   . Hypertension Brother   . Hyperlipidemia Brother   . Migraines Daughter   . Hypertension Sister   . Hyperlipidemia Sister   . Cancer Sister   . Stomach cancer Sister        passed away with the CA  . Hypertension Sister    . Hyperlipidemia Sister   . Hypertension Brother   . Hyperlipidemia Brother   . Hypertension Brother   . Hyperlipidemia Brother   . Hypertension Brother   . Hyperlipidemia Brother   . Hypertension Brother   . Hyperlipidemia  Brother   . Diabetes Paternal Grandmother   . Hypertension Paternal Grandmother   . Colon cancer Neg Hx   . Colon polyps Neg Hx   . Esophageal cancer Neg Hx   . Rectal cancer Neg Hx     Medications Tasha Huerta had no medications administered during this visit. Current Outpatient Medications  Medication Sig Dispense Refill  . acetaminophen (TYLENOL) 500 MG tablet Take 500 mg by mouth every 6 (six) hours as needed for moderate pain or headache.    . spironolactone (ALDACTONE) 50 MG tablet Take 50 mg by mouth daily.    Marland Kitchen amLODipine (NORVASC) 10 MG tablet Take 1 tablet (10 mg total) by mouth daily. (Patient not taking: No sig reported) 90 tablet 3   No current facility-administered medications for this visit.    Allergies Chlorthalidone and Lisinopril   Physical Exam:  BP (!) 154/64   Pulse 68   Wt 231 lb 4.8 oz (104.9 kg)   BMI 44.43 kg/m  Body mass index is 44.43 kg/m. General appearance: Well nourished, well developed female in no acute distress.  Cardiovascular: normal s1 and s2.  No murmurs, rubs or gallops. Respiratory:  Clear to auscultation bilateral. Normal respiratory effort Abdomen: positive bowel sounds and no masses, hernias; diffusely non tender to palpation, non distended. Well healed vertical midline incision Neuro/Psych:  Normal mood and affect.  Skin:  Warm and dry.  Lymphatic:  No inguinal lymphadenopathy.   Pelvic exam: is limited by body habitus EGBUS: within normal limits, moderate atrophy Vagina: within normal limits and with no blood or discharge in the vault. Moderate atrophy Cervix: no obvious cervix. ?dimple at the apex Uterus:  nonenlarged and non tender Adnexa:  normal adnexa and no mass, fullness,  tenderness Rectovaginal: deferred  Laboratory: poc u/a with small hgb and leuks  Radiology: none  Assessment: pt stable  Plan: 1. History of postmenopausal bleeding I clarified her OBGYN history. She states that she has NOT had a hysterectomy and her only gyn surgery is she had a c/s and BTL with her last child and all other deliveries were vag deliveries.   Spotting most likely from the trich and BV. Test of cure today. I told her if s/s come back to let us know as she will need an ultrasound. I also told her to use lots of lubrication b/c of the vaginal atrophy - Cytology - PAP( [place]) - Cervicovaginal ancillary only( [place])  2. History of trichomoniasis Patient amenable to blood screening - Cytology - PAP( [place]) - Cervicovaginal ancillary only( [place]) - HIV antibody (with reflex) - RPR - Hepatitis C Antibody - Hepatitis B Surface AntiGEN - Urine Culture  3. Dysuria Will await ucx results  - Urine Culture  4. Acute vaginitis F/u swab  5. Well Woman Patient declines mammogram screening  Interpreter used   RTC PRN  Durene Romans MD Attending Center for [facility])

## 2020-11-10 LAB — CERVICOVAGINAL ANCILLARY ONLY
Bacterial Vaginitis (gardnerella): NEGATIVE
Candida Glabrata: NEGATIVE
Candida Vaginitis: NEGATIVE
Chlamydia: NEGATIVE
Comment: NEGATIVE
Comment: NEGATIVE
Comment: NEGATIVE
Comment: NEGATIVE
Comment: NEGATIVE
Comment: NORMAL
Neisseria Gonorrhea: NEGATIVE
Trichomonas: NEGATIVE

## 2020-11-10 LAB — HEPATITIS C ANTIBODY: Hep C Virus Ab: <0.1 s/co ratio (ref 0.0–0.9)

## 2020-11-10 LAB — HEPATITIS B SURFACE ANTIGEN: Hepatitis B Surface Ag: NEGATIVE

## 2020-11-10 LAB — HIV ANTIBODY (ROUTINE TESTING W REFLEX): HIV Screen 4th Generation wRfx: NONREACTIVE

## 2020-11-10 LAB — RPR: RPR Ser Ql: NONREACTIVE

## 2020-11-12 LAB — URINE CULTURE

## 2020-11-13 LAB — CYTOLOGY - PAP
Comment: NEGATIVE
Diagnosis: NEGATIVE
Diagnosis: REACTIVE
High risk HPV: NEGATIVE

## 2020-11-13 MED ORDER — NITROFURANTOIN MONOHYD MACRO 100 MG PO CAPS
ORAL_CAPSULE | ORAL | 0 refills | Status: DC
Start: 2020-11-13 — End: 2020-12-19

## 2020-11-13 NOTE — Addendum Note (Signed)
Addended by: Aletha Halim on: 11/13/2020 03:34 PM   Modules accepted: Orders

## 2020-11-14 ENCOUNTER — Telehealth: Payer: Self-pay | Admitting: *Deleted

## 2020-11-14 NOTE — Telephone Encounter (Signed)
I called patient mobile number with Interpreter Raquel Leandro Reasoner and were unable to leave a message- because heard a message voicemail not set up. I called her home number and a female answered and said Akayla not there and gave [place] same mobile number.she said Ze[place] does not answer if she doesn't know who it is. We asked if she could call [place] and ask her to answer our call in 5 minutes. Ewen Varnell,RN  I called [place] back with Interpreter Laural Golden and we informed her of results and recommendations per Dr. Ilda Basset  We reviewed antibiotics instructions. She voices understanding Maia Handa,RN

## 2020-11-14 NOTE — Telephone Encounter (Signed)
-----   Message from Aletha Halim, MD sent at 11/13/2020  3:34 PM EDT ----- Please call her and let her know that she has a UTI and an antibiotic was sent in for this. Thanks

## 2020-12-19 ENCOUNTER — Other Ambulatory Visit: Payer: Self-pay

## 2020-12-19 ENCOUNTER — Encounter: Payer: Self-pay | Admitting: Nurse Practitioner

## 2020-12-19 ENCOUNTER — Ambulatory Visit: Payer: Self-pay | Attending: Nurse Practitioner | Admitting: Nurse Practitioner

## 2020-12-19 VITALS — BP 144/82 | HR 86 | Resp 18 | Ht 61.0 in | Wt 235.0 lb

## 2020-12-19 DIAGNOSIS — I10 Essential (primary) hypertension: Secondary | ICD-10-CM

## 2020-12-19 DIAGNOSIS — Z1231 Encounter for screening mammogram for malignant neoplasm of breast: Secondary | ICD-10-CM

## 2020-12-19 MED ORDER — TRIAMCINOLONE ACETONIDE 0.1 % EX CREA
1.0000 | TOPICAL_CREAM | Freq: Two times a day (BID) | CUTANEOUS | 0 refills | Status: DC
Start: 2020-12-19 — End: 2022-01-23

## 2020-12-19 MED ORDER — AMLODIPINE BESYLATE 10 MG PO TABS: 10.0000 mg | ORAL_TABLET | Freq: Every day | ORAL | 1 refills | Status: DC

## 2020-12-19 MED ORDER — SPIRONOLACTONE 50 MG PO TABS: 50.0000 mg | ORAL_TABLET | Freq: Every day | ORAL | 1 refills | Status: DC

## 2020-12-19 NOTE — Progress Notes (Signed)
Assessment & Plan:  Anita [name] was seen today for hypertension.  Diagnoses and all orders for this visit:  Essential hypertension -     amLODipine (NORVASC) 10 MG tablet; Take 1 tablet (10 mg total) by mouth daily. -     spironolactone (ALDACTONE) 50 MG tablet; Take 1 tablet (50 mg total) by mouth daily. -     CMP14+EGFR Continue all antihypertensives as prescribed.  Remember to bring in your blood pressure log with you for your follow up appointment.  DASH/Mediterranean Diets are healthier choices for HTN.    Breast cancer screening by mammogram -     MM DIGITAL SCREENING BILATERAL; Future  Other orders -     triamcinolone cream (KENALOG) 0.1 %; Apply 1 application topically 2 (two) times daily.    Patient has been counseled on age-appropriate routine health concerns for screening and prevention. These are reviewed and up-to-date. Referrals have been placed accordingly. Immunizations are up-to-date or declined.    Subjective:   Chief Complaint  Patient presents with  . Hypertension   HPI Anita [name] 65 y.o. female presents to office today for follow up to HTN. She is accompanied by her granddaughter who in interpreting for her today.   She has a past medical history of ABDOMINAL PAIN (05/05/2009), Adjustment disorder with depressed mood (08/30/2009), ALLERGIC RHINITIS (12/04/2009),  Anxiety, Aortic stenosis, CHOLELITHIASIS (05/30/2009), COLONIC POLYPS, HX OF (05/09/2010), Depression, HTN (05/05/2009), GERD (06/12/2009), Heart murmur,  LIVER MASS (05/30/2009),  Sleep apnea   Essential Hypertension She is no longer taking amlodipine. States it made her feel hot all over. She does have a history of intolerance to several other blood pressure medications (valsartan, toprol XL, HCTZ, chlorthalidone, carvedilol . Her weight is also up. She has not made much progress with weight loss. Not exercising or reducing calories. She is taking spironolactone 50 mg daily. I have encouraged her  to take amlodipine 68m at night as she may be able to tolerate the symptoms while she is asleep vs during the day.  BP Readings from Last 3 Encounters:  12/19/20 (!) 144/82  11/09/20 (!) 154/64  11/01/20 (!) 170/98   Wt Readings from Last 3 Encounters:  12/19/20 235 lb (106.6 kg)  11/09/20 231 lb 4.8 oz (104.9 kg)  11/01/20 227 lb (103 kg)   Review of Systems  Constitutional: Negative for fever, malaise/fatigue and weight loss.  HENT: Negative.  Negative for nosebleeds.   Eyes: Negative.  Negative for blurred vision, double vision and photophobia.  Respiratory: Negative.  Negative for cough and shortness of breath.   Cardiovascular: Negative.  Negative for chest pain, palpitations and leg swelling.  Gastrointestinal: Negative.  Negative for heartburn, nausea and vomiting.  Musculoskeletal: Negative.  Negative for myalgias.  Skin: Positive for itching and rash.  Neurological: Negative.  Negative for dizziness, focal weakness, seizures and headaches.  Psychiatric/Behavioral: Negative.  Negative for suicidal ideas.    Past Medical History:  Diagnosis Date  . ABDOMINAL PAIN 05/05/2009   Qualifier: Diagnosis of  By: WJorene Minors Scott    . Adjustment disorder with depressed mood 08/30/2009   Qualifier: Diagnosis of  By: WJorene Minors Scott    . ALLERGIC RHINITIS 12/04/2009   Qualifier: Diagnosis of  By: WJorene Minors Scott    . Allergy   . Anxiety   . Aortic stenosis    mild by echo 03/2019  . CHOLELITHIASIS 05/30/2009   Qualifier: Diagnosis of  By: WJorene Minors Scott    . COLONIC POLYPS, HX OF  05/09/2010   Qualifier: Diagnosis of  By: Jorene Minors, Scott    . Depression   . Essential hypertension, benign 05/05/2009   Qualifier: Diagnosis of  By: Jorene Minors, Scott    . GERD 06/12/2009   Qualifier: Diagnosis of  By: Jorene Minors, Scott    . Heart murmur    Pacemaker  . Hypertension   . KNEE PAIN 06/12/2009   Qualifier: Diagnosis of  By: Jorene Minors, Scott    . LIVER MASS 05/30/2009    Qualifier: Diagnosis of  By: Jorene Minors, Scott    . OBESITY 10/24/2010   Qualifier: Diagnosis of  By: Keenan Bachelor RN, Rip Harbour    . Sleep apnea    sleep study on hold  . TINNITUS, CHRONIC, RIGHT 09/07/2010   Qualifier: Diagnosis of  By: Amil Amen MD, Benjamine Mola    . TOOTH LOSS 09/07/2010   Qualifier: Diagnosis of  By: Amil Amen MD, Benjamine Mola    . 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 VARICOSE VEINS, LOWER EXTREMITIES 05/05/2009   Qualifier: Diagnosis of  By: Versie Starks      Past Surgical History:  Procedure Laterality Date  . APPENDECTOMY    . CESAREAN SECTION WITH BILATERAL TUBAL LIGATION    . CHOLECYSTECTOMY    . COLONOSCOPY  2011  . LEAD REVISION/REPAIR N/A 04/09/2019   Procedure: LEAD REVISION/REPAIR;  Surgeon: Evans Lance, MD;  Location: [place] [facility];  Service: Cardiovascular;  Laterality: N/A;  . PACEMAKER IMPLANT N/A 03/29/2019   Procedure: PACEMAKER IMPLANT;  Surgeon: Evans Lance, MD;  Location: [place] [facility];  Service: Cardiovascular;  Laterality: N/A;    Family History  Problem Relation Age of Onset  . Hypertension Sister   . Hyperlipidemia Sister   . Hypertension Brother   . Hyperlipidemia Brother   . Migraines Daughter   . Hypertension Sister   . Hyperlipidemia Sister   . Cancer Sister   . Stomach cancer Sister        passed away with the CA  . Hypertension Sister   . Hyperlipidemia Sister   . Hypertension Brother   . Hyperlipidemia Brother   . Hypertension Brother   . Hyperlipidemia Brother   . Hypertension Brother   . Hyperlipidemia Brother   . Hypertension Brother   . Hyperlipidemia Brother   . Diabetes Paternal Grandmother   . Hypertension Paternal Grandmother   . Colon cancer Neg Hx   . Colon polyps Neg Hx   . Esophageal cancer Neg Hx   . Rectal cancer Neg Hx     Social History Reviewed with no changes to be made today.   Outpatient Medications Prior to Visit  Medication Sig  Dispense Refill  . amLODipine (NORVASC) 10 MG tablet Take 1 tablet (10 mg total) by mouth daily. 90 tablet 3  . spironolactone (ALDACTONE) 50 MG tablet Take 50 mg by mouth daily.    Marland Kitchen acetaminophen (TYLENOL) 500 MG tablet Take 500 mg by mouth every 6 (six) hours as needed for moderate pain or headache. (Patient not taking: Reported on 12/19/2020)    . nitrofurantoin, macrocrystal-monohydrate, (MACROBID) 100 MG capsule One tab po bid. Take the 2nd dose as qhs so it can sit in your bladder overnight. 14 capsule 0   No facility-administered medications prior to visit.    Allergies  Allergen Reactions  . Chlorthalidone     hypokalemia  . Lisinopril Other (See Comments)    Chills  and arm pain       Objective:    BP (!) 144/82   Pulse 86   Resp 18   Ht 5' 1" (1.549 m)   Wt 235 lb (106.6 kg)   SpO2 96%   BMI 44.40 kg/m  Wt Readings from Last 3 Encounters:  12/19/20 235 lb (106.6 kg)  11/09/20 231 lb 4.8 oz (104.9 kg)  11/01/20 227 lb (103 kg)    Physical Exam Vitals and nursing note reviewed.  Constitutional:      Appearance: She is well-developed.  HENT:     Head: Normocephalic and atraumatic.  Cardiovascular:     Rate and Rhythm: Normal rate and regular rhythm.     Heart sounds: Normal heart sounds. No murmur heard. No friction rub. No gallop.   Pulmonary:     Effort: Pulmonary effort is normal. No tachypnea or respiratory distress.     Breath sounds: Normal breath sounds. No decreased breath sounds, wheezing, rhonchi or rales.  Chest:     Chest wall: No tenderness.  Abdominal:     General: Bowel sounds are normal.     Palpations: Abdomen is soft.  Musculoskeletal:        General: Normal range of motion.     Cervical back: Normal range of motion.  Skin:    General: Skin is warm and dry.     Findings: Rash present.  Neurological:     Mental Status: She is alert and oriented to person, place, and time.     Coordination: Coordination normal.  Psychiatric:         Behavior: Behavior normal. Behavior is cooperative.        Thought Content: Thought content normal.        Judgment: Judgment normal.          Patient has been counseled extensively about nutrition and exercise as well as the importance of adherence with medications and regular follow-up. The patient was given clear instructions to go to ER or return to medical center if symptoms don't improve, worsen or new problems develop. The patient verbalized understanding.   Follow-up: Return in about 4 weeks (around 01/16/2021) for BP CHECK WITH LUKE see me in 3 months. Gildardo Pounds, FNP-BC [facility] [place], [place]   12/30/2020, 1:02 AM

## 2020-12-20 LAB — CMP14+EGFR
ALT: 18 IU/L (ref 0–32)
AST: 19 IU/L (ref 0–40)
Albumin/Globulin Ratio: 1.6 (ref 1.2–2.2)
Albumin: 4.5 g/dL (ref 3.8–4.8)
Alkaline Phosphatase: 58 IU/L (ref 44–121)
BUN/Creatinine Ratio: 19 (ref 12–28)
BUN: 15 mg/dL (ref 8–27)
Bilirubin Total: 0.6 mg/dL (ref 0.0–1.2)
CO2: 23 mmol/L (ref 20–29)
Calcium: 9.5 mg/dL (ref 8.7–10.3)
Chloride: 103 mmol/L (ref 96–106)
Creatinine, Ser: 0.81 mg/dL (ref 0.57–1.00)
Globulin, Total: 2.9 g/dL (ref 1.5–4.5)
Glucose: 107 mg/dL — ABNORMAL HIGH (ref 65–99)
Potassium: 4.5 mmol/L (ref 3.5–5.2)
Sodium: 140 mmol/L (ref 134–144)
Total Protein: 7.4 g/dL (ref 6.0–8.5)
eGFR: 80 mL/min/{1.73_m2} (ref >59)

## 2020-12-21 ENCOUNTER — Telehealth: Payer: Self-pay | Admitting: Gastroenterology

## 2020-12-21 NOTE — Telephone Encounter (Signed)
Hey Dr. Loletha Carrow,   Inbound call from patient daughter. Patient needs to cancel Endo/Colon procedure 5/13 due to insurance purposes. Patient have applied for the orange card to see if it will help cover cost but have not heard back so will call later to reschedule.

## 2020-12-21 NOTE — Telephone Encounter (Signed)
Sheri,  When you can, please investigate the scheduling and insurance situation for this patient. Unfortunately only given one day notice from patient, so double slot will most likely go unfilled.  - HD

## 2020-12-21 NOTE — Telephone Encounter (Signed)
Patient was evaluated in office by Carl Best, RNP.  Upper endoscopy and colonoscopy was ordered to be performed by Dr. Loletha Carrow.  Patient is uninsured.  Patient was provided the good faith estimate at this office visit and understood that they will be responsible for the bill.  Patient elected to proceed and schedule the ordered procedures understanding the costs of the procedures. Patient should have called by 5pm on Tuesday to cancel the procedures.

## 2020-12-22 ENCOUNTER — Encounter: Payer: Self-pay | Admitting: Gastroenterology

## 2020-12-30 ENCOUNTER — Encounter: Payer: Self-pay | Admitting: Nurse Practitioner

## 2021-01-09 ENCOUNTER — Ambulatory Visit (INDEPENDENT_AMBULATORY_CARE_PROVIDER_SITE_OTHER): Payer: Self-pay

## 2021-01-09 DIAGNOSIS — I495 Sick sinus syndrome: Secondary | ICD-10-CM

## 2021-01-10 LAB — CUP PACEART REMOTE DEVICE CHECK
Battery Remaining Longevity: 143 mo
Battery Voltage: 3.04 V
Brady Statistic AP VP Percent: 0.22 %
Brady Statistic AP VS Percent: 92.28 %
Brady Statistic AS VP Percent: 0.07 %
Brady Statistic AS VS Percent: 7.43 %
Brady Statistic RA Percent Paced: 92.5 %
Brady Statistic RV Percent Paced: 0.29 %
Date Time Interrogation Session: 20220530211435
Implantable Lead Implant Date: 20200817
Implantable Lead Implant Date: 20200828
Implantable Lead Location: 753859
Implantable Lead Location: 753860
Implantable Lead Model: 5076
Implantable Lead Model: 5076
Implantable Pulse Generator Implant Date: 20200817
Lead Channel Impedance Value: 361 Ohm
Lead Channel Impedance Value: 399 Ohm
Lead Channel Impedance Value: 399 Ohm
Lead Channel Impedance Value: 456 Ohm
Lead Channel Pacing Threshold Amplitude: 0.375 V
Lead Channel Pacing Threshold Amplitude: 0.625 V
Lead Channel Pacing Threshold Pulse Width: 0.4 ms
Lead Channel Pacing Threshold Pulse Width: 0.4 ms
Lead Channel Sensing Intrinsic Amplitude: 1.125 mV
Lead Channel Sensing Intrinsic Amplitude: 1.125 mV
Lead Channel Sensing Intrinsic Amplitude: 11.375 mV
Lead Channel Sensing Intrinsic Amplitude: 11.375 mV
Lead Channel Setting Pacing Amplitude: 1.5 V
Lead Channel Setting Pacing Amplitude: 2.5 V
Lead Channel Setting Pacing Pulse Width: 0.4 ms
Lead Channel Setting Sensing Sensitivity: 1.2 mV

## 2021-01-19 ENCOUNTER — Other Ambulatory Visit: Payer: Self-pay

## 2021-01-19 ENCOUNTER — Ambulatory Visit: Payer: Self-pay | Attending: Nurse Practitioner | Admitting: Pharmacist

## 2021-01-19 ENCOUNTER — Encounter: Payer: Self-pay | Admitting: Pharmacist

## 2021-01-19 VITALS — BP 122/78

## 2021-01-19 DIAGNOSIS — I10 Essential (primary) hypertension: Secondary | ICD-10-CM

## 2021-01-19 MED ORDER — AMLODIPINE BESYLATE 10 MG PO TABS: 5.0000 mg | ORAL_TABLET | Freq: Every day | ORAL | 1 refills | Status: DC

## 2021-01-19 NOTE — Progress Notes (Signed)
   S:    Patient arrives in good spirits. Presents to the clinic for hypertension evaluation, counseling, and management. Patient was referred and last seen by Primary Care Provider on 12/19/2020.   Medication adherence reported.  Current BP Medications include:  amlodipine 10 mg daily (pt is staking 1/2 tablet daily), spironolactone 50 mg daily   Dietary habits include: admits to dietary indiscretion. Does not limit salt. Drinks soda occasionally. Exercise habits include:none  Family / Social history:  -Fhx: HTN, HLD, migraines, DM    O:  Vitals:   01/19/21 1614  BP: 122/78   Home BP readings: none   Last 3 Office BP readings: BP Readings from Last 3 Encounters:  01/19/21 122/78  12/19/20 (!) 144/82  11/09/20 (!) 154/64    BMET    Component Value Date/Time   NA 140 12/19/2020 1030   K 4.5 12/19/2020 1030   CL 103 12/19/2020 1030   CO2 23 12/19/2020 1030   GLUCOSE 107 (H) 12/19/2020 1030   GLUCOSE 128 (H) 03/04/2020 0006   BUN 15 12/19/2020 1030   CREATININE 0.81 12/19/2020 1030   CREATININE 0.81 01/15/2013 1017   CALCIUM 9.5 12/19/2020 1030   GFRNONAA 70 09/21/2020 0957   GFRAA 80 09/21/2020 0957    Renal function: CrCl cannot be calculated (Patient's most recent lab result is older than the maximum 21 days allowed.).  Clinical ASCVD: No  The ASCVD Risk score Mikey Bussing DC Jr., et al., 2013) failed to calculate for the following reasons:   Cannot find a previous HDL lab   Cannot find a previous total cholesterol lab    A/P: Hypertension longstanding currently at goal on current medications. BP Goal = < 130/80 mmHg. Medication adherence reported with spironolactone. She is only taking 1/2 tablet of amlodipine 10 mg daily (5mg  total). I will have her continue this given her BP today in clinic.   -Continued spironolactone 50 mg daily. -Continued amlodipine 5 mg daily (pt takes 1/2 of the 10 mg tablet).  -Counseled on lifestyle modifications for blood pressure  control including reduced dietary sodium, increased exercise, adequate sleep.  Results reviewed and written information provided.   Total time in face-to-face counseling 15 minutes.   F/U Clinic Visit with PCP in August.  Benard Halsted, PharmD, [place], [place] [phone]

## 2021-01-31 NOTE — Progress Notes (Signed)
Remote pacemaker transmission.   

## 2021-02-28 ENCOUNTER — Other Ambulatory Visit: Payer: Self-pay | Admitting: Obstetrics and Gynecology

## 2021-02-28 DIAGNOSIS — Z1231 Encounter for screening mammogram for malignant neoplasm of breast: Secondary | ICD-10-CM

## 2021-03-21 ENCOUNTER — Encounter: Payer: Self-pay | Admitting: Nurse Practitioner

## 2021-03-21 ENCOUNTER — Ambulatory Visit: Payer: Self-pay | Attending: Nurse Practitioner | Admitting: Nurse Practitioner

## 2021-03-21 ENCOUNTER — Other Ambulatory Visit: Payer: Self-pay

## 2021-03-21 DIAGNOSIS — I10 Essential (primary) hypertension: Secondary | ICD-10-CM

## 2021-03-21 DIAGNOSIS — F419 Anxiety disorder, unspecified: Secondary | ICD-10-CM

## 2021-03-21 MED ORDER — ESCITALOPRAM OXALATE 10 MG PO TABS: 10.0000 mg | ORAL_TABLET | Freq: Every day | ORAL | 0 refills | Status: DC

## 2021-03-21 MED ORDER — SPIRONOLACTONE 50 MG PO TABS: 50.0000 mg | ORAL_TABLET | Freq: Every day | ORAL | 1 refills | Status: DC

## 2021-03-21 NOTE — Progress Notes (Signed)
Virtual Visit via Telephone Note Due to national recommendations of social distancing due to [place] 19, telehealth visit is felt to be most appropriate for this patient at this time.  I discussed the limitations, risks, security and privacy concerns of performing an evaluation and management service by telephone and the availability of in person appointments. I also discussed with the patient that there may be a patient responsible charge related to this service. The patient expressed understanding and agreed to proceed.    I connected with Anita [name] on 03/21/21  at   4:10 PM EDT  EDT by telephone and verified that I am speaking with the correct person using two identifiers.  Location of Patient: Private Residence   Location of Provider: [place] and [place] participating in Telemedicine visit: Geryl Rankins FNP-BC [place] ID# [id number]   History of Present Illness: Telemedicine visit for: Follow up to HTN  Essential Hypertension Blood pressure is well controlled. She is taking amlodipine 5 mg daily and spironolactone 50 mg daily.  Denies chest pain, shortness of breath, palpitations, lightheadedness, dizziness, headaches.  BP Readings from Last 3 Encounters:  01/19/21 122/78  12/19/20 (!) 144/82  11/09/20 (!) 154/64     Anxiety She is currently seeing a therapist but would like to start medication for her "nerves". States every since her "pacemaker" was placed she has been stressed.    Past Medical History:  Diagnosis Date   ABDOMINAL PAIN 05/05/2009   Qualifier: Diagnosis of  By: Jorene Minors, Scott     Adjustment disorder with depressed mood 08/30/2009   Qualifier: Diagnosis of  By: Jorene Minors, Scott     ALLERGIC RHINITIS 12/04/2009   Qualifier: Diagnosis of  By: Jorene Minors, Scott     Allergy    Anxiety    Aortic stenosis    mild by echo 03/2019   CHOLELITHIASIS 05/30/2009   Qualifier: Diagnosis of  By:  Jorene Minors, Scott     COLONIC POLYPS, HX OF 05/09/2010   Qualifier: Diagnosis of  By: Versie Starks     Depression    Essential hypertension, benign 05/05/2009   Qualifier: Diagnosis of  By: Jorene Minors, Scott     GERD 06/12/2009   Qualifier: Diagnosis of  By: Jorene Minors, Scott     Heart murmur    Pacemaker   Hypertension    KNEE PAIN 06/12/2009   Qualifier: Diagnosis of  By: Versie Starks     LIVER MASS 05/30/2009   Qualifier: Diagnosis of  By: Jorene Minors, Scott     OBESITY 10/24/2010   Qualifier: Diagnosis of  By: Keenan Bachelor RN, Melinda     Sleep apnea    sleep study on hold   TINNITUS, CHRONIC, RIGHT 09/07/2010   Qualifier: Diagnosis of  By: Amil Amen MD, Talbert Nan LOSS 09/07/2010   Qualifier: Diagnosis of  By: Amil Amen MD, [place] 08/30/2009   Qualifier: Diagnosis of  By: Jorene Minors, Scott     VARICOSE VEINS, LOWER EXTREMITIES 05/05/2009   Qualifier: Diagnosis of  By: Versie Starks      Past Surgical History:  Procedure Laterality Date   APPENDECTOMY     CESAREAN SECTION WITH BILATERAL TUBAL LIGATION     CHOLECYSTECTOMY     COLONOSCOPY  2011   LEAD REVISION/REPAIR N/A 04/09/2019   Procedure: LEAD REVISION/REPAIR;  Surgeon: Evans Lance, MD;  Location: [facility];  Service: Cardiovascular;  Laterality: N/A;   PACEMAKER IMPLANT N/A 03/29/2019   Procedure: PACEMAKER IMPLANT;  Surgeon: Evans Lance, MD;  Location: [facility];  Service: Cardiovascular;  Laterality: N/A;    Family History  Problem Relation Age of Onset   Hypertension Sister    Hyperlipidemia Sister    Hypertension Brother    Hyperlipidemia Brother    Migraines Daughter    Hypertension Sister    Hyperlipidemia Sister    Cancer Sister    Stomach cancer Sister        passed away with the CA   Hypertension Sister    Hyperlipidemia Sister    Hypertension Brother    Hyperlipidemia Brother    Hypertension Brother    Hyperlipidemia Brother     Hypertension Brother    Hyperlipidemia Brother    Hypertension Brother    Hyperlipidemia Brother    Diabetes Paternal Grandmother    Hypertension Paternal Grandmother    Colon cancer Neg Hx    Colon polyps Neg Hx    Esophageal cancer Neg Hx    Rectal cancer Neg Hx     Social History   Socioeconomic History   Marital status: Married    Spouse name: Freida Busman   Number of children: 5   Years of education: 6th grade   Highest education level: Not on file  Occupational History   Occupation: homemaker  Tobacco Use   Smoking status: Never   Smokeless tobacco: Never  Vaping Use   Vaping Use: Never used  Substance and Sexual Activity   Alcohol use: No    Alcohol/week: 0.0 standard drinks   Drug use: No   Sexual activity: Not Currently  Other Topics Concern   Not on file  Social History Narrative   From [place], [place]. Came to the [place] 2002. Lives with her husband. Their son and grandson live nearby.   Social Determinants of Health   Financial Resource Strain: Not on file  Food Insecurity: No Food Insecurity   Worried About Charity fundraiser in the Last Year: Never true   Ran Out of Food in the Last Year: Never true  Transportation Needs: No Transportation Needs   Lack of Transportation (Medical): No   Lack of Transportation (Non-Medical): No  Physical Activity: Not on file  Stress: Not on file  Social Connections: Not on file     Observations/Objective: Awake, alert and oriented x 3   Review of Systems  Constitutional:  Negative for fever, malaise/fatigue and weight loss.  HENT: Negative.  Negative for nosebleeds.   Eyes: Negative.  Negative for blurred vision, double vision and photophobia.  Respiratory: Negative.  Negative for cough and shortness of breath.   Cardiovascular: Negative.  Negative for chest pain, palpitations and leg swelling.  Gastrointestinal: Negative.  Negative for heartburn, nausea and vomiting.  Musculoskeletal: Negative.  Negative for  myalgias.  Neurological: Negative.  Negative for dizziness, focal weakness, seizures and headaches.  Psychiatric/Behavioral:  Negative for suicidal ideas. The patient is nervous/anxious.    Assessment and Plan: Diagnoses and all orders for this visit:  Anxiety -     escitalopram (LEXAPRO) 10 MG tablet; Take 1 tablet (10 mg total) by mouth daily. For anxiety  Essential hypertension -     spironolactone (ALDACTONE) 50 MG tablet; Take 1 tablet (50 mg total) by mouth daily.  Continue all antihypertensives as prescribed.  Remember to bring in your blood pressure log with you for your follow up appointment.  DASH/Mediterranean Diets are healthier choices for HTN.    Follow Up Instructions Return in about 7 weeks (around 05/09/2021) for Anxiety.     I discussed the assessment and treatment plan with the patient. The patient was provided an opportunity to ask questions and all were answered. The patient agreed with the plan and demonstrated an understanding of the instructions.   The patient was advised to call back or seek an in-person evaluation if the symptoms worsen or if the condition fails to improve as anticipated.  I provided 18 minutes of non-face-to-face time during this encounter including median intraservice time, reviewing previous notes, labs, imaging, medications and explaining diagnosis and management.  Gildardo Pounds, FNP-BC

## 2021-03-22 ENCOUNTER — Ambulatory Visit
Admission: RE | Admit: 2021-03-22 | Discharge: 2021-03-22 | Disposition: A | Discharge status: Home / Self Care | Payer: No Typology Code available for payment source | Source: Ambulatory Visit | Attending: Obstetrics and Gynecology | Admitting: Obstetrics and Gynecology

## 2021-03-22 ENCOUNTER — Other Ambulatory Visit: Payer: Self-pay

## 2021-03-22 ENCOUNTER — Ambulatory Visit: Payer: Self-pay | Admitting: *Deleted

## 2021-03-22 VITALS — BP 146/82 | Wt 237.8 lb

## 2021-03-22 DIAGNOSIS — Z1231 Encounter for screening mammogram for malignant neoplasm of breast: Secondary | ICD-10-CM

## 2021-03-22 DIAGNOSIS — Z1239 Encounter for other screening for malignant neoplasm of breast: Secondary | ICD-10-CM

## 2021-03-22 NOTE — Patient Instructions (Signed)
Explained breast self awareness with Anita [name]. Patient did not need a Pap smear today due to last Pap smear and HPV typing was 11/09/2020. Let her know B[facility]CP will cover Pap smears and HPV typing every 5 years unless has a history of abnormal Pap smears. Referred patient to the [facility] for a screening mammogram on the mobile unit. Appointment scheduled Thursday, March 22, 2021 at 1420. Patient escorted to the mobile unit following B[facility]CP appointment for her screening mammogram. Let patient know the [facility] will follow up with her within the next couple weeks with results of her mammogram by letter or phone. Anita [name] verbalized understanding.  Idaliz Tinkle, Arvil Chaco, RN 1:30 PM

## 2021-03-22 NOTE — Progress Notes (Signed)
Anita [name] is a 65 y.o. female who presents to [facility] clinic today with complaint of occasional right breast cramping since Pacemaker was inserted in September 2020. Patients last mammogram was completed 09/14/2019 and was negative. Advised patient to follow-up with her PCP or the doctor that inserted the pacemaker.    Pap Smear: Pap smear not completed today. Last Pap smear was 11/09/2020 at [facility] and was normal with negative HPV. Per patient has no history of an abnormal Pap smear. Last Pap smear result is available in Epic.   Physical exam: Breasts Breasts symmetrical. No skin abnormalities bilateral breasts. No nipple retraction bilateral breasts. No nipple discharge bilateral breasts. No lymphadenopathy. No lumps palpated bilateral breasts. No complaints of pain or tenderness on exam.  MS DIGITAL SCREENING TOMO BILATERAL  Result Date: 09/14/2019 CLINICAL DATA:  Screening. EXAM: DIGITAL SCREENING BILATERAL MAMMOGRAM WITH TOMO AND CAD COMPARISON:  Previous exam(s). ACR Breast Density Category b: There are scattered areas of fibroglandular density. FINDINGS: There are no findings suspicious for malignancy. Images were processed with CAD. IMPRESSION: No mammographic evidence of malignancy. A result letter of this screening mammogram will be mailed directly to the patient. RECOMMENDATION: Screening mammogram in one year. (Code:SM-B-01Y) BI-RADS CATEGORY  1: Negative. Electronically Signed   By: Lillia Mountain M.D.   On: 09/14/2019 16:14         Pelvic/Bimanual Pap is not indicated today per BCCCP guidelines.   Smoking History: Patient has never smoked.   Patient Navigation: Patient education provided. Access to services provided for patient through [facility] program. Spanish interpreter [name] from [facility] provided.   Colorectal Cancer Screening: Per patient has had colonoscopy completed on 05/03/2010 at [facility].  No complaints today.     Breast and Cervical Cancer Risk Assessment: Patient does not have family history of breast cancer, known genetic mutations, or radiation treatment to the chest before age 26. Patient does not have history of cervical dysplasia, immunocompromised, or DES exposure in-utero.  Risk Assessment     Risk Scores       03/22/2021   Last edited by: Demetrius Revel, LPN   5-year risk: 0.7 %   Lifetime risk: 2.6 %            A: B[facility]CP exam without pap smear Complaints of occasional right breast cramping.  P: Referred patient to the [facility] for a screening mammogram on the mobile unit. Appointment scheduled Thursday, March 22, 2021 at 1420.  [name], RN 03/22/2021 1:30 PM

## 2021-04-09 ENCOUNTER — Ambulatory Visit (INDEPENDENT_AMBULATORY_CARE_PROVIDER_SITE_OTHER): Payer: Self-pay

## 2021-04-09 DIAGNOSIS — I495 Sick sinus syndrome: Secondary | ICD-10-CM

## 2021-04-09 LAB — CUP PACEART REMOTE DEVICE CHECK
Battery Remaining Longevity: 141 mo
Battery Voltage: 3.03 V
Brady Statistic AP VP Percent: 0.06 %
Brady Statistic AP VS Percent: 95.09 %
Brady Statistic AS VP Percent: 0.02 %
Brady Statistic AS VS Percent: 4.83 %
Brady Statistic RA Percent Paced: 95.14 %
Brady Statistic RV Percent Paced: 0.08 %
Date Time Interrogation Session: 20220828211330
Implantable Lead Implant Date: 20200817
Implantable Lead Implant Date: 20200828
Implantable Lead Location: 753859
Implantable Lead Location: 753860
Implantable Lead Model: 5076
Implantable Lead Model: 5076
Implantable Pulse Generator Implant Date: 20200817
Lead Channel Impedance Value: 361 Ohm
Lead Channel Impedance Value: 399 Ohm
Lead Channel Impedance Value: 399 Ohm
Lead Channel Impedance Value: 475 Ohm
Lead Channel Pacing Threshold Amplitude: 0.5 V
Lead Channel Pacing Threshold Amplitude: 0.625 V
Lead Channel Pacing Threshold Pulse Width: 0.4 ms
Lead Channel Pacing Threshold Pulse Width: 0.4 ms
Lead Channel Sensing Intrinsic Amplitude: 11 mV
Lead Channel Sensing Intrinsic Amplitude: 11 mV
Lead Channel Sensing Intrinsic Amplitude: 3.125 mV
Lead Channel Sensing Intrinsic Amplitude: 3.125 mV
Lead Channel Setting Pacing Amplitude: 1.5 V
Lead Channel Setting Pacing Amplitude: 2.5 V
Lead Channel Setting Pacing Pulse Width: 0.4 ms
Lead Channel Setting Sensing Sensitivity: 1.2 mV

## 2021-04-20 NOTE — Progress Notes (Signed)
Remote pacemaker transmission.   

## 2021-05-08 ENCOUNTER — Ambulatory Visit: Payer: Self-pay | Admitting: Nurse Practitioner

## 2021-06-22 ENCOUNTER — Other Ambulatory Visit: Payer: Self-pay

## 2021-06-22 ENCOUNTER — Ambulatory Visit: Payer: Self-pay | Attending: Nurse Practitioner | Admitting: Nurse Practitioner

## 2021-06-22 ENCOUNTER — Encounter: Payer: Self-pay | Admitting: Nurse Practitioner

## 2021-06-22 VITALS — BP 190/77 | HR 71 | Ht 61.0 in | Wt 241.1 lb

## 2021-06-22 DIAGNOSIS — G629 Polyneuropathy, unspecified: Secondary | ICD-10-CM

## 2021-06-22 DIAGNOSIS — I10 Essential (primary) hypertension: Secondary | ICD-10-CM

## 2021-06-22 DIAGNOSIS — F419 Anxiety disorder, unspecified: Secondary | ICD-10-CM

## 2021-06-22 DIAGNOSIS — Z23 Encounter for immunization: Secondary | ICD-10-CM

## 2021-06-22 MED ORDER — AMLODIPINE BESYLATE 10 MG PO TABS: 10.0000 mg | ORAL_TABLET | Freq: Every day | ORAL | 1 refills | Status: DC

## 2021-06-22 MED ORDER — GABAPENTIN 100 MG PO CAPS: 100.0000 mg | ORAL_CAPSULE | Freq: Every day | ORAL | 3 refills | Status: DC

## 2021-06-22 MED ORDER — SPIRONOLACTONE 50 MG PO TABS: 50.0000 mg | ORAL_TABLET | Freq: Every day | ORAL | 1 refills | Status: DC

## 2021-06-22 MED ORDER — ESCITALOPRAM OXALATE 10 MG PO TABS
10.0000 mg | ORAL_TABLET | Freq: Every day | ORAL | 1 refills | Status: DC
Start: 2021-06-22 — End: 2022-01-23

## 2021-06-22 NOTE — Progress Notes (Signed)
Assessment & Plan:  Anita [name] was seen today for anxiety and depression.  Diagnoses and all orders for this visit:  Essential hypertension -     amLODipine (NORVASC) 10 MG tablet; Take 1 tablet (10 mg total) by mouth daily. para la presion arterial -     spironolactone (ALDACTONE) 50 MG tablet; Take 1 tablet (50 mg total) by mouth daily. para la presion arterial -     CMP14+EGFR  Neuropathy -     gabapentin (NEURONTIN) 100 MG capsule; Take 1 capsule (100 mg total) by mouth at bedtime. para el dolor de piernas  Anxiety -     escitalopram (LEXAPRO) 10 MG tablet; Take 1 tablet (10 mg total) by mouth daily. para la ansiedad  Need for influenza vaccination -     Flu Vaccine QUAD High Dose(Fluad)   Patient has been counseled on age-appropriate routine health concerns for screening and prevention. These are reviewed and up-to-date. Referrals have been placed accordingly. Immunizations are up-to-date or declined.    Subjective:   Chief Complaint  Patient presents with   Anxiety   Depression   Anxiety Patient reports no chest pain, dizziness, nausea, palpitations, shortness of breath or suicidal ideas.    Depression        Associated symptoms include no myalgias, no headaches and no suicidal ideas.  Past medical history includes anxiety.   Anita [name] 65 y.o. female presents to office today for follow up to anxiety and depression. Her blood pressure is poorly controlled. She endorses medication adherence however [facility] has verified that she has not picked up medications in several months.   She endorses right leg numbness without radiation. Denies back pain.   Blood pressure is poorly controlled. Will increase amlodipine 5 mg to 10 mg and spironolactone 50 mg daily. She has not been taking either medication as prescribed and has not picked either medication up in several months.  BP Readings from Last 3 Encounters:  06/22/21 (!) 190/77  03/22/21 (!) 146/82  01/19/21  122/78     Anxiety She has not been taking lexapro as prescribed. Only taking as needing which I have advised her is not the appropriate way to take an SSRI.  Depression screen [facility] 2/9 06/22/2021 12/19/2020 11/09/2020 09/21/2020 06/25/2019  Decreased Interest 2 3 0 3 0  Down, Depressed, Hopeless 2 3 3 3 3   PHQ - 2 Score 4 6 3 6 3   Altered sleeping 1 3 1 3 2   Tired, decreased energy 1 3 1 3 1   Change in appetite 0 0 0 0 1  Feeling bad or failure about yourself  1 1 3  0 0  Trouble concentrating 0 3 2 0 3  Moving slowly or fidgety/restless 0 3 1 1  0  Suicidal thoughts 0 0 0 1 0  PHQ-9 Score 7 19 11 14 10   Difficult doing work/chores Not difficult at all - - - -    GAD 7 : Generalized Anxiety Score 06/22/2021 12/19/2020 11/09/2020 06/25/2019  Nervous, Anxious, on Edge 3 3 2 3   Control/stop worrying 3 3 1 3   Worry too much - different things 3 3 3 3   Trouble relaxing 3 3 3  0  Restless 0 1 3 0  Easily annoyed or irritable 2 1 3 3   Afraid - awful might happen 0 2 2 0  Total GAD 7 Score 14 16 17 12       Review of Systems  Constitutional:  Negative for fever, malaise/fatigue and weight loss.  HENT: Negative.  Negative for nosebleeds.   Eyes: Negative.  Negative for blurred vision, double vision and photophobia.  Respiratory: Negative.  Negative for cough and shortness of breath.   Cardiovascular: Negative.  Negative for chest pain, palpitations and leg swelling.  Gastrointestinal: Negative.  Negative for heartburn, nausea and vomiting.  Musculoskeletal:  Positive for joint pain. Negative for myalgias.  Neurological:  Positive for sensory change. Negative for dizziness, focal weakness, seizures and headaches.  Psychiatric/Behavioral:  Positive for depression. Negative for suicidal ideas.    Past Medical History:  Diagnosis Date   ABDOMINAL PAIN 05/05/2009   Qualifier: Diagnosis of  By: Jorene Minors, Scott     Adjustment disorder with depressed mood 08/30/2009   Qualifier: Diagnosis of   By: Jorene Minors, Scott     ALLERGIC RHINITIS 12/04/2009   Qualifier: Diagnosis of  By: Jorene Minors, Scott     Allergy    Anxiety    Aortic stenosis    mild by echo 03/2019   CHOLELITHIASIS 05/30/2009   Qualifier: Diagnosis of  By: Jorene Minors, Scott     COLONIC POLYPS, HX OF 05/09/2010   Qualifier: Diagnosis of  By: Versie Starks     Depression    Essential hypertension, benign 05/05/2009   Qualifier: Diagnosis of  By: Jorene Minors, Scott     GERD 06/12/2009   Qualifier: Diagnosis of  By: Jorene Minors, Scott     Heart murmur    Pacemaker   Hypertension    KNEE PAIN 06/12/2009   Qualifier: Diagnosis of  By: Versie Starks     LIVER MASS 05/30/2009   Qualifier: Diagnosis of  By: Jorene Minors, Scott     OBESITY 10/24/2010   Qualifier: Diagnosis of  By: Keenan Bachelor RN, Melinda     Sleep apnea    sleep study on hold   TINNITUS, CHRONIC, RIGHT 09/07/2010   Qualifier: Diagnosis of  By: Amil Amen MD, Talbert Nan LOSS 09/07/2010   Qualifier: Diagnosis of  By: Amil Amen MD, [place] 08/30/2009   Qualifier: Diagnosis of  By: Jorene Minors, Scott     VARICOSE VEINS, LOWER EXTREMITIES 05/05/2009   Qualifier: Diagnosis of  By: Versie Starks      Past Surgical History:  Procedure Laterality Date   APPENDECTOMY     CESAREAN SECTION WITH BILATERAL TUBAL LIGATION     CHOLECYSTECTOMY     COLONOSCOPY  2011   LEAD REVISION/REPAIR N/A 04/09/2019   Procedure: LEAD REVISION/REPAIR;  Surgeon: Evans Lance, MD;  Location: [facility];  Service: Cardiovascular;  Laterality: N/A;   PACEMAKER IMPLANT N/A 03/29/2019   Procedure: PACEMAKER IMPLANT;  Surgeon: Evans Lance, MD;  Location: [facility];  Service: Cardiovascular;  Laterality: N/A;    Family History  Problem Relation Age of Onset   Hypertension Sister    Hyperlipidemia Sister    Hypertension Sister    Hyperlipidemia Sister    Cancer Sister    Stomach cancer Sister        passed away  with the CA   Hypertension Sister    Hyperlipidemia Sister    Migraines Daughter    Diabetes Paternal Grandmother    Hypertension Paternal Grandmother    Hypertension Brother    Hyperlipidemia Brother    Hypertension Brother    Hyperlipidemia Brother    Hypertension Brother    Hyperlipidemia Brother    Hypertension Brother    Hyperlipidemia  Brother    Hypertension Brother    Hyperlipidemia Brother    Colon cancer Neg Hx    Colon polyps Neg Hx    Esophageal cancer Neg Hx    Rectal cancer Neg Hx    Breast cancer Neg Hx     Social History Reviewed with no changes to be made today.   Outpatient Medications Prior to Visit  Medication Sig Dispense Refill   triamcinolone cream (KENALOG) 0.1 % Apply 1 application topically 2 (two) times daily. 100 g 0   amLODipine (NORVASC) 10 MG tablet Take 0.5 tablets (5 mg total) by mouth daily. 45 tablet 1   escitalopram (LEXAPRO) 10 MG tablet Take 1 tablet (10 mg total) by mouth daily. For anxiety 90 tablet 0   spironolactone (ALDACTONE) 50 MG tablet Take 1 tablet (50 mg total) by mouth daily. 90 tablet 1   No facility-administered medications prior to visit.    Allergies  Allergen Reactions   Chlorthalidone     hypokalemia   Lisinopril Other (See Comments)    Chills and arm pain       Objective:    BP (!) 190/77   Pulse 71   Ht $R'5\' 1"'cW$  (1.549 m)   Wt 241 lb 2 oz (109.4 kg)   SpO2 97%   BMI 45.56 kg/m  Wt Readings from Last 3 Encounters:  06/22/21 241 lb 2 oz (109.4 kg)  03/22/21 237 lb 12.8 oz (107.9 kg)  12/19/20 235 lb (106.6 kg)    Physical Exam Vitals and nursing note reviewed.  Constitutional:      Appearance: She is well-developed.  HENT:     Head: Normocephalic and atraumatic.  Cardiovascular:     Rate and Rhythm: Normal rate and regular rhythm.     Heart sounds: Normal heart sounds. No murmur heard.   No friction rub. No gallop.  Pulmonary:     Effort: Pulmonary effort is normal. No tachypnea or respiratory  distress.     Breath sounds: Normal breath sounds. No decreased breath sounds, wheezing, rhonchi or rales.  Chest:     Chest wall: No tenderness.  Abdominal:     General: Bowel sounds are normal.     Palpations: Abdomen is soft.  Musculoskeletal:        General: Normal range of motion.     Cervical back: Normal range of motion.  Skin:    General: Skin is warm and dry.  Neurological:     Mental Status: She is alert and oriented to person, place, and time.     Coordination: Coordination normal.  Psychiatric:        Attention and Perception: Attention normal.        Mood and Affect: Mood is anxious.        Behavior: Behavior normal. Behavior is cooperative.        Thought Content: Thought content normal.        Judgment: Judgment normal.         Patient has been counseled extensively about nutrition and exercise as well as the importance of adherence with medications and regular follow-up. The patient was given clear instructions to go to ER or return to medical center if symptoms don't improve, worsen or new problems develop. The patient verbalized understanding.   Follow-up: Return in about 2 weeks (around 07/06/2021) for BP CHECK WITH LUKE 2 weeks. Tele on tuesday with me in 4 weeks anxiety. see me in 3 months HTN.   Gildardo Pounds, FNP-BC  [facility] and [place] [place], [place]   06/22/2021, 9:09 PM

## 2021-06-22 NOTE — Progress Notes (Signed)
Assessment & Plan:  Anita [name] was seen today for anxiety and depression.  Diagnoses and all orders for this visit:  Essential hypertension -     amLODipine (NORVASC) 10 MG tablet; Take 1 tablet (10 mg total) by mouth daily. para la presion arterial -     spironolactone (ALDACTONE) 50 MG tablet; Take 1 tablet (50 mg total) by mouth daily. para la presion arterial -     CMP14+EGFR  Neuropathy -     gabapentin (NEURONTIN) 100 MG capsule; Take 1 capsule (100 mg total) by mouth at bedtime. para el dolor de piernas  Anxiety -     escitalopram (LEXAPRO) 10 MG tablet; Take 1 tablet (10 mg total) by mouth daily. para la ansiedad  Need for influenza vaccination -     Flu Vaccine QUAD High Dose(Fluad)   Patient has been counseled on age-appropriate routine health concerns for screening and prevention. These are reviewed and up-to-date. Referrals have been placed accordingly. Immunizations are up-to-date or declined.    Subjective:   Chief Complaint  Patient presents with   Anxiety   Depression   HPI Anita [name] 65 y.o. female presents to office today for follow up to anxiety and depression. Her blood pressure is poorly controlled. She endorses medication adherence however [facility] has verified that she has not picked up medications in several months.   She endorses right leg numbness without radiation. Denies back pain.   Blood pressure is poorly controlled. Will increase amlodipine 5 mg to 10 mg and spironolactone 50 mg daily. She has not been taking either medication as prescribed and has not picked either medication up in several months.  BP Readings from Last 3 Encounters:  06/22/21 (!) 190/77  03/22/21 (!) 146/82  01/19/21 122/78     Anxiety She has not been taking lexapro as prescribed. Only taking as needing which I have advised her is not the appropriate way to take an SSRI.  Depression screen [facility] 2/9 06/22/2021 12/19/2020 11/09/2020 09/21/2020 06/25/2019  Decreased  Interest 2 3 0 3 0  Down, Depressed, Hopeless _0 PHQ - 2 Score _1 Altered sleeping _2 Tired, decreased energy _3 Change in appetite 0 0 0 0 1  Feeling bad or failure about yourself  _4 0 0  Trouble concentrating 0 3 2 0 3  Moving slowly or fidgety/restless 0 _5 0  Suicidal thoughts 0 0 0 1 0  PHQ-9 Score _6 Difficult doing work/chores Not difficult at all - - - -    GAD 7 : Generalized Anxiety Score 06/22/2021 12/19/2020 11/09/2020 06/25/2019  Nervous, Anxious, on Edge _7 Control/stop worrying _8 Worry too much - different things _9 Trouble relaxing _10 0  Restless 0 1 3 0  Easily annoyed or irritable _11 Afraid - awful might happen 0 2 2 0  Total GAD 7 Score _12 Review of Systems  Constitutional:  Negative for fever, malaise/fatigue and weight loss.  HENT: Negative.  Negative for nosebleeds.   Eyes: Negative.  Negative for blurred vision, double vision and photophobia.  Respiratory: Negative.  Negative for cough and shortness of breath.   Cardiovascular: Negative.  Negative for chest pain, palpitations and leg swelling.  Gastrointestinal: Negative.  Negative for heartburn, nausea and vomiting.  Musculoskeletal:  Positive for joint pain. Negative for myalgias.  Neurological:  Positive for sensory change. Negative for dizziness, focal weakness, seizures and headaches.  Psychiatric/Behavioral: Negative.  Negative for suicidal ideas.    Past Medical History:  Diagnosis Date   ABDOMINAL PAIN 05/05/2009   Qualifier: Diagnosis of  By: Jorene Minors, Scott     Adjustment disorder with depressed mood 08/30/2009   Qualifier: Diagnosis of  By: Jorene Minors, Scott     ALLERGIC RHINITIS 12/04/2009   Qualifier: Diagnosis of  By: Jorene Minors, Scott     Allergy    Anxiety    Aortic stenosis    mild by echo 03/2019   CHOLELITHIASIS 05/30/2009   Qualifier: Diagnosis of  By: Jorene Minors, Scott     COLONIC  POLYPS, HX OF 05/09/2010   Qualifier: Diagnosis of  By: Versie Starks     Depression    Essential hypertension, benign 05/05/2009   Qualifier: Diagnosis of  By: Jorene Minors, Scott     GERD 06/12/2009   Qualifier: Diagnosis of  By: Jorene Minors, Scott     Heart murmur    Pacemaker   Hypertension    KNEE PAIN 06/12/2009   Qualifier: Diagnosis of  By: Versie Starks     LIVER MASS 05/30/2009   Qualifier: Diagnosis of  By: Jorene Minors, Scott     OBESITY 10/24/2010   Qualifier: Diagnosis of  By: Keenan Bachelor RN, Melinda     Sleep apnea    sleep study on hold   TINNITUS, CHRONIC, RIGHT 09/07/2010   Qualifier: Diagnosis of  By: Amil Amen MD, Talbert Nan LOSS 09/07/2010   Qualifier: Diagnosis of  By: Amil Amen MD, [place] 08/30/2009   Qualifier: Diagnosis of  By: Jorene Minors, Scott     VARICOSE VEINS, LOWER EXTREMITIES 05/05/2009   Qualifier: Diagnosis of  By: Versie Starks      Past Surgical History:  Procedure Laterality Date   APPENDECTOMY     CESAREAN SECTION WITH BILATERAL TUBAL LIGATION     CHOLECYSTECTOMY     COLONOSCOPY  2011   LEAD REVISION/REPAIR N/A 04/09/2019   Procedure: LEAD REVISION/REPAIR;  Surgeon: Evans Lance, MD;  Location: [place] [facility];  Service: Cardiovascular;  Laterality: N/A;   PACEMAKER IMPLANT N/A 03/29/2019   Procedure: PACEMAKER IMPLANT;  Surgeon: Evans Lance, MD;  Location: [place] [facility];  Service: Cardiovascular;  Laterality: N/A;    Family History  Problem Relation Age of Onset   Hypertension Sister    Hyperlipidemia Sister    Hypertension Sister    Hyperlipidemia Sister    Cancer Sister    Stomach cancer Sister        passed away with the CA   Hypertension Sister    Hyperlipidemia Sister    Migraines Daughter    Diabetes Paternal Grandmother    Hypertension Paternal Grandmother    Hypertension Brother    Hyperlipidemia Brother    Hypertension Brother    Hyperlipidemia Brother     Hypertension Brother    Hyperlipidemia Brother    Hypertension Brother    Hyperlipidemia Brother    Hypertension Brother    Hyperlipidemia Brother    Colon cancer Neg Hx    Colon polyps Neg Hx    Esophageal cancer Neg Hx    Rectal cancer Neg Hx    Breast cancer Neg Hx  Social History Reviewed with no changes to be made today.   Outpatient Medications Prior to Visit  Medication Sig Dispense Refill   triamcinolone cream (KENALOG) 0.1 % Apply 1 application topically 2 (two) times daily. 100 g 0   amLODipine (NORVASC) 10 MG tablet Take 0.5 tablets (5 mg total) by mouth daily. 45 tablet 1   escitalopram (LEXAPRO) 10 MG tablet Take 1 tablet (10 mg total) by mouth daily. For anxiety 90 tablet 0   spironolactone (ALDACTONE) 50 MG tablet Take 1 tablet (50 mg total) by mouth daily. 90 tablet 1   No facility-administered medications prior to visit.    Allergies  Allergen Reactions   Chlorthalidone     hypokalemia   Lisinopril Other (See Comments)    Chills and arm pain       Objective:    BP (!) 190/77   Pulse 71   Ht _0  (1.549 m)   Wt 241 lb 2 oz (109.4 kg)   SpO2 97%   BMI 45.56 kg/m  Wt Readings from Last 3 Encounters:  06/22/21 241 lb 2 oz (109.4 kg)  03/22/21 237 lb 12.8 oz (107.9 kg)  12/19/20 235 lb (106.6 kg)    Physical Exam Vitals and nursing note reviewed.  Constitutional:      Appearance: She is well-developed.  HENT:     Head: Normocephalic and atraumatic.  Cardiovascular:     Rate and Rhythm: Normal rate and regular rhythm.     Heart sounds: Normal heart sounds. No murmur heard.   No friction rub. No gallop.  Pulmonary:     Effort: Pulmonary effort is normal. No tachypnea or respiratory distress.     Breath sounds: Normal breath sounds. No decreased breath sounds, wheezing, rhonchi or rales.  Chest:     Chest wall: No tenderness.  Abdominal:     General: Bowel sounds are normal.     Palpations: Abdomen is soft.  Musculoskeletal:         General: Normal range of motion.     Cervical back: Normal range of motion.  Skin:    General: Skin is warm and dry.  Neurological:     Mental Status: She is alert and oriented to person, place, and time.     Coordination: Coordination normal.  Psychiatric:        Attention and Perception: Attention normal.        Mood and Affect: Mood is anxious.        Behavior: Behavior normal. Behavior is cooperative.        Thought Content: Thought content normal.        Judgment: Judgment normal.         Patient has been counseled extensively about nutrition and exercise as well as the importance of adherence with medications and regular follow-up. The patient was given clear instructions to go to ER or return to medical center if symptoms don't improve, worsen or new problems develop. The patient verbalized understanding.   Follow-up: Return in about 2 weeks (around 07/06/2021) for BP CHECK WITH LUKE 2 weeks. Tele on tuesday with me in 4 weeks anxiety. see me in 3 months HTN.   Gildardo Pounds, FNP-BC [facility] and [place], [place]   06/22/2021, 9:06 PM

## 2021-06-23 LAB — CMP14+EGFR
ALT: 19 IU/L (ref 0–32)
AST: 22 IU/L (ref 0–40)
Albumin/Globulin Ratio: 1.7 (ref 1.2–2.2)
Albumin: 4.5 g/dL (ref 3.8–4.8)
Alkaline Phosphatase: 69 IU/L (ref 44–121)
BUN/Creatinine Ratio: 18 (ref 12–28)
BUN: 15 mg/dL (ref 8–27)
Bilirubin Total: 0.5 mg/dL (ref 0.0–1.2)
CO2: 23 mmol/L (ref 20–29)
Calcium: 9.4 mg/dL (ref 8.7–10.3)
Chloride: 105 mmol/L (ref 96–106)
Creatinine, Ser: 0.85 mg/dL (ref 0.57–1.00)
Globulin, Total: 2.6 g/dL (ref 1.5–4.5)
Glucose: 109 mg/dL — ABNORMAL HIGH (ref 70–99)
Potassium: 5.2 mmol/L (ref 3.5–5.2)
Sodium: 140 mmol/L (ref 134–144)
Total Protein: 7.1 g/dL (ref 6.0–8.5)
eGFR: 75 mL/min/{1.73_m2} (ref >59)

## 2021-06-26 ENCOUNTER — Other Ambulatory Visit: Payer: Self-pay

## 2021-06-26 DIAGNOSIS — Z1211 Encounter for screening for malignant neoplasm of colon: Secondary | ICD-10-CM

## 2021-06-28 LAB — FECAL OCCULT BLOOD, IMMUNOCHEMICAL: Fecal Occult Bld: NEGATIVE

## 2021-07-09 ENCOUNTER — Ambulatory Visit (INDEPENDENT_AMBULATORY_CARE_PROVIDER_SITE_OTHER): Payer: Self-pay

## 2021-07-09 DIAGNOSIS — I495 Sick sinus syndrome: Secondary | ICD-10-CM

## 2021-07-09 LAB — CUP PACEART REMOTE DEVICE CHECK
Battery Remaining Longevity: 137 mo
Battery Voltage: 3.03 V
Brady Statistic AP VP Percent: 0.06 %
Brady Statistic AP VS Percent: 86.68 %
Brady Statistic AS VP Percent: 0.01 %
Brady Statistic AS VS Percent: 13.24 %
Brady Statistic RA Percent Paced: 86.73 %
Brady Statistic RV Percent Paced: 0.08 %
Date Time Interrogation Session: 20221128034238
Implantable Lead Implant Date: 20200817
Implantable Lead Implant Date: 20200828
Implantable Lead Location: 753859
Implantable Lead Location: 753860
Implantable Lead Model: 5076
Implantable Lead Model: 5076
Implantable Pulse Generator Implant Date: 20200817
Lead Channel Impedance Value: 361 Ohm
Lead Channel Impedance Value: 399 Ohm
Lead Channel Impedance Value: 399 Ohm
Lead Channel Impedance Value: 437 Ohm
Lead Channel Pacing Threshold Amplitude: 0.375 V
Lead Channel Pacing Threshold Amplitude: 0.625 V
Lead Channel Pacing Threshold Pulse Width: 0.4 ms
Lead Channel Pacing Threshold Pulse Width: 0.4 ms
Lead Channel Sensing Intrinsic Amplitude: 1.75 mV
Lead Channel Sensing Intrinsic Amplitude: 1.75 mV
Lead Channel Sensing Intrinsic Amplitude: 9.625 mV
Lead Channel Sensing Intrinsic Amplitude: 9.625 mV
Lead Channel Setting Pacing Amplitude: 1.5 V
Lead Channel Setting Pacing Amplitude: 2.5 V
Lead Channel Setting Pacing Pulse Width: 0.4 ms
Lead Channel Setting Sensing Sensitivity: 1.2 mV

## 2021-07-13 ENCOUNTER — Ambulatory Visit: Payer: No Typology Code available for payment source | Admitting: Pharmacist

## 2021-07-17 NOTE — Progress Notes (Signed)
Remote pacemaker transmission.   

## 2021-07-24 ENCOUNTER — Telehealth: Payer: Self-pay | Admitting: Nurse Practitioner

## 2021-07-24 ENCOUNTER — Ambulatory Visit: Payer: No Typology Code available for payment source | Attending: Nurse Practitioner | Admitting: Nurse Practitioner

## 2021-07-24 NOTE — Telephone Encounter (Signed)
Copied from [place] [id number]. Topic: General - Other >> Jul 24, 2021  9:59 AM Tessa Lerner A wrote: Reason for CRM: The patient has returned the missed call for their virtual appointment and would like to be contacted at [phone] for today   Please contact further when available

## 2021-07-24 NOTE — Telephone Encounter (Signed)
NO answer Contact Information  [phone] (Mobile) UNABLE TO LVM  [phone] (Home Phone) NO ANSWER LVM

## 2021-10-05 ENCOUNTER — Ambulatory Visit: Payer: No Typology Code available for payment source | Admitting: Nurse Practitioner

## 2021-10-08 ENCOUNTER — Ambulatory Visit (INDEPENDENT_AMBULATORY_CARE_PROVIDER_SITE_OTHER): Payer: Self-pay

## 2021-10-08 DIAGNOSIS — I495 Sick sinus syndrome: Secondary | ICD-10-CM

## 2021-10-08 DIAGNOSIS — Z950 Presence of cardiac pacemaker: Secondary | ICD-10-CM

## 2021-10-08 LAB — CUP PACEART REMOTE DEVICE CHECK
Battery Remaining Longevity: 132 mo
Battery Voltage: 3.03 V
Brady Statistic AP VP Percent: 0.17 %
Brady Statistic AP VS Percent: 89.79 %
Brady Statistic AS VP Percent: 0.03 %
Brady Statistic AS VS Percent: 10 %
Brady Statistic RA Percent Paced: 89.96 %
Brady Statistic RV Percent Paced: 0.21 %
Date Time Interrogation Session: 20230227003815
Implantable Lead Implant Date: 20200817
Implantable Lead Implant Date: 20200828
Implantable Lead Location: 753859
Implantable Lead Location: 753860
Implantable Lead Model: 5076
Implantable Lead Model: 5076
Implantable Pulse Generator Implant Date: 20200817
Lead Channel Impedance Value: 380 Ohm
Lead Channel Impedance Value: 380 Ohm
Lead Channel Impedance Value: 399 Ohm
Lead Channel Impedance Value: 399 Ohm
Lead Channel Pacing Threshold Amplitude: 0.5 V
Lead Channel Pacing Threshold Amplitude: 0.625 V
Lead Channel Pacing Threshold Pulse Width: 0.4 ms
Lead Channel Pacing Threshold Pulse Width: 0.4 ms
Lead Channel Sensing Intrinsic Amplitude: 1.875 mV
Lead Channel Sensing Intrinsic Amplitude: 1.875 mV
Lead Channel Sensing Intrinsic Amplitude: 10.875 mV
Lead Channel Sensing Intrinsic Amplitude: 10.875 mV
Lead Channel Setting Pacing Amplitude: 1.5 V
Lead Channel Setting Pacing Amplitude: 2.5 V
Lead Channel Setting Pacing Pulse Width: 0.4 ms
Lead Channel Setting Sensing Sensitivity: 1.2 mV

## 2021-10-12 NOTE — Progress Notes (Signed)
Remote pacemaker transmission.   

## 2022-01-08 ENCOUNTER — Ambulatory Visit (INDEPENDENT_AMBULATORY_CARE_PROVIDER_SITE_OTHER): Payer: Self-pay

## 2022-01-08 DIAGNOSIS — I495 Sick sinus syndrome: Secondary | ICD-10-CM

## 2022-01-08 LAB — CUP PACEART REMOTE DEVICE CHECK
Battery Remaining Longevity: 129 mo
Battery Voltage: 3.02 V
Brady Statistic AP VP Percent: 0.26 %
Brady Statistic AP VS Percent: 92.52 %
Brady Statistic AS VP Percent: 0.08 %
Brady Statistic AS VS Percent: 7.15 %
Brady Statistic RA Percent Paced: 92.77 %
Brady Statistic RV Percent Paced: 0.33 %
Date Time Interrogation Session: 20230530044914
Implantable Lead Implant Date: 20200817
Implantable Lead Implant Date: 20200828
Implantable Lead Location: 753859
Implantable Lead Location: 753860
Implantable Lead Model: 5076
Implantable Lead Model: 5076
Implantable Pulse Generator Implant Date: 20200817
Lead Channel Impedance Value: 361 Ohm
Lead Channel Impedance Value: 361 Ohm
Lead Channel Impedance Value: 399 Ohm
Lead Channel Impedance Value: 399 Ohm
Lead Channel Pacing Threshold Amplitude: 0.5 V
Lead Channel Pacing Threshold Amplitude: 0.625 V
Lead Channel Pacing Threshold Pulse Width: 0.4 ms
Lead Channel Pacing Threshold Pulse Width: 0.4 ms
Lead Channel Sensing Intrinsic Amplitude: 0.875 mV
Lead Channel Sensing Intrinsic Amplitude: 0.875 mV
Lead Channel Sensing Intrinsic Amplitude: 10.5 mV
Lead Channel Sensing Intrinsic Amplitude: 10.5 mV
Lead Channel Setting Pacing Amplitude: 1.5 V
Lead Channel Setting Pacing Amplitude: 2.5 V
Lead Channel Setting Pacing Pulse Width: 0.4 ms
Lead Channel Setting Sensing Sensitivity: 1.2 mV

## 2022-01-23 ENCOUNTER — Ambulatory Visit: Payer: Self-pay | Attending: Nurse Practitioner | Admitting: Nurse Practitioner

## 2022-01-23 ENCOUNTER — Encounter: Payer: Self-pay | Admitting: Nurse Practitioner

## 2022-01-23 ENCOUNTER — Other Ambulatory Visit: Payer: Self-pay | Admitting: Nurse Practitioner

## 2022-01-23 VITALS — BP 156/86 | HR 80 | Wt 238.6 lb

## 2022-01-23 DIAGNOSIS — F418 Other specified anxiety disorders: Secondary | ICD-10-CM

## 2022-01-23 DIAGNOSIS — I10 Essential (primary) hypertension: Secondary | ICD-10-CM

## 2022-01-23 DIAGNOSIS — Z0000 Encounter for general adult medical examination without abnormal findings: Secondary | ICD-10-CM

## 2022-01-23 DIAGNOSIS — Z13820 Encounter for screening for osteoporosis: Secondary | ICD-10-CM

## 2022-01-23 DIAGNOSIS — G629 Polyneuropathy, unspecified: Secondary | ICD-10-CM

## 2022-01-23 DIAGNOSIS — Z0001 Encounter for general adult medical examination with abnormal findings: Secondary | ICD-10-CM

## 2022-01-23 DIAGNOSIS — F15982 Other stimulant use, unspecified with stimulant-induced sleep disorder: Secondary | ICD-10-CM

## 2022-01-23 DIAGNOSIS — Z780 Asymptomatic menopausal state: Secondary | ICD-10-CM

## 2022-01-23 DIAGNOSIS — F5105 Insomnia due to other mental disorder: Secondary | ICD-10-CM

## 2022-01-23 DIAGNOSIS — Z6841 Body Mass Index (BMI) 40.0 and over, adult: Secondary | ICD-10-CM

## 2022-01-23 DIAGNOSIS — E785 Hyperlipidemia, unspecified: Secondary | ICD-10-CM

## 2022-01-23 DIAGNOSIS — F419 Anxiety disorder, unspecified: Secondary | ICD-10-CM

## 2022-01-23 DIAGNOSIS — Z1231 Encounter for screening mammogram for malignant neoplasm of breast: Secondary | ICD-10-CM

## 2022-01-23 DIAGNOSIS — R7989 Other specified abnormal findings of blood chemistry: Secondary | ICD-10-CM

## 2022-01-23 MED ORDER — GABAPENTIN 100 MG PO CAPS
100.0000 mg | ORAL_CAPSULE | Freq: Every day | ORAL | 3 refills | Status: DC
  Filled 2022-07-19: qty 30, 30d supply, fill #0
  Filled 2022-10-25: qty 30, 30d supply, fill #1
  Filled 2022-11-26: qty 30, 30d supply, fill #2
  Filled 2023-01-03: qty 30, 30d supply, fill #3

## 2022-01-23 MED ORDER — TRAZODONE HCL 50 MG PO TABS: 50.0000 mg | ORAL_TABLET | Freq: Every evening | ORAL | 3 refills | Status: DC | PRN

## 2022-01-23 MED ORDER — AMLODIPINE BESYLATE 10 MG PO TABS
10.0000 mg | ORAL_TABLET | Freq: Every day | ORAL | 1 refills | Status: DC
Start: 2022-01-23 — End: 2022-07-18

## 2022-01-23 MED ORDER — TRIAMCINOLONE ACETONIDE 0.1 % EX CREA: 1.0000 "application " | TOPICAL_CREAM | Freq: Two times a day (BID) | CUTANEOUS | 0 refills | Status: DC

## 2022-01-23 MED ORDER — TRAZODONE HCL 50 MG PO TABS: 50.0000 mg | ORAL_TABLET | Freq: Every evening | ORAL | 1 refills | Status: DC | PRN

## 2022-01-23 MED ORDER — ESCITALOPRAM OXALATE 10 MG PO TABS
10.0000 mg | ORAL_TABLET | Freq: Every day | ORAL | 1 refills | Status: DC
  Filled 2022-07-19: qty 30, 30d supply, fill #0
  Filled 2022-08-23: qty 30, 30d supply, fill #1
  Filled 2022-09-24: qty 30, 30d supply, fill #2
  Filled 2022-10-25: qty 30, 30d supply, fill #3

## 2022-01-23 MED ORDER — SPIRONOLACTONE 100 MG PO TABS
100.0000 mg | ORAL_TABLET | Freq: Every day | ORAL | 1 refills | Status: DC
Start: 2022-01-23 — End: 2022-07-18

## 2022-01-23 NOTE — Progress Notes (Signed)
Assessment & Plan:  Anita [name] was seen today for annual exam and insomnia.  Diagnoses and all orders for this visit:  Encounter for annual physical exam  Encounter for osteoporosis screening in asymptomatic postmenopausal patient -     DG Bone Density; Future  Breast cancer screening by mammogram -     MS DIGITAL SCREENING TOMO BILATERAL; Future  Essential hypertension -     CMP14+EGFR -     amLODipine (NORVASC) 10 MG tablet; Take 1 tablet (10 mg total) by mouth daily. para la presion arterial -     spironolactone (ALDACTONE) 100 MG tablet; Take 1 tablet (100 mg total) by mouth daily. para la presion arterial  Abnormal CBC -     CBC  Dyslipidemia, goal LDL below 100 -     Lipid panel  Neuropathy -     gabapentin (NEURONTIN) 100 MG capsule; Take 1 capsule (100 mg total) by mouth at bedtime. para el dolor de piernas  Anxiety -     escitalopram (LEXAPRO) 10 MG tablet; Take 1 tablet (10 mg total) by mouth daily. para la ansiedad  Insomnia secondary to depression with anxiety -     traZODone (DESYREL) 50 MG tablet; Take 1-2 tablets (50-100 mg total) by mouth at bedtime as needed for sleep. PARA DORMIR    Patient has been counseled on age-appropriate routine health concerns for screening and prevention. These are reviewed and up-to-date. Referrals have been placed accordingly. Immunizations are up-to-date or declined.    Subjective:   Chief Complaint  Patient presents with   Annual Exam   Insomnia   HPI Anita [name] 66 y.o. female presents to office today for annual physical She is accompanied by her granddaughter.  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   HTN Blood pressure is elevated. Will increase spironolactone 50mg  to 100 mg today. She will continue on amlodipine 10mg  daily BP Readings from Last 3 Encounters:  01/23/22 (!) 156/86  06/22/21 (!) 190/77  03/22/21 (!) 146/82    Insomnia Has trouble falling asleep and staying asleep. She is also drinking caffeine at night which I have advised against. She does take gabapentin at night however she states this does not cause drowsiness.    Depression and Anxiety She has not been taking lexapro as prescribed. Only taking prn. Again I have advised her to take this daily.   Skin rash: Patient complains of skin problem.  The ulcer is located on the  lower left leg . Discoloration has been present several years. Pain is minimal Interventions to date:  steroid cream . Patient denies tobacco use. Patient does not have a history of diabetes. She does have significant degree of varicose veins present in the upper leg.       Review of Systems  Constitutional:  Negative for fever, malaise/fatigue and weight loss.  HENT: Negative.  Negative for nosebleeds.   Eyes: Negative.  Negative for blurred vision, double vision and photophobia.  Respiratory: Negative.  Negative for cough and shortness of breath.   Cardiovascular: Negative.  Negative for chest pain, palpitations and leg swelling.  Gastrointestinal: Negative.  Negative for heartburn, nausea and vomiting.  Genitourinary: Negative.   Musculoskeletal:  Negative.  Negative for myalgias.  Skin:  Positive for itching and rash.  Neurological: Negative.  Negative for dizziness, focal weakness, seizures and headaches.  Endo/Heme/Allergies: Negative.   Psychiatric/Behavioral:  Positive for depression. Negative for suicidal ideas. The patient is nervous/anxious and has insomnia.     Past Medical History:   Diagnosis Date   ABDOMINAL PAIN 05/05/2009   Qualifier: Diagnosis of  By: Huntley Dec, Scott     Adjustment disorder with depressed mood 08/30/2009   Qualifier: Diagnosis of  By: Huntley Dec, Scott     ALLERGIC RHINITIS 12/04/2009   Qualifier: Diagnosis of  By: Huntley Dec, Scott     Allergy    Anxiety    Aortic stenosis    mild by echo 03/2019   CHOLELITHIASIS 05/30/2009   Qualifier: Diagnosis of  By: Huntley Dec, Scott     COLONIC POLYPS, HX OF 05/09/2010   Qualifier: Diagnosis of  By: Brynda Rim     Depression    Essential hypertension, benign 05/05/2009   Qualifier: Diagnosis of  By: Huntley Dec, Scott     GERD 06/12/2009   Qualifier: Diagnosis of  By: Huntley Dec, Scott     Heart murmur    Pacemaker   Hypertension    KNEE PAIN 06/12/2009   Qualifier: Diagnosis of  By: Brynda Rim     LIVER MASS 05/30/2009   Qualifier: Diagnosis of  By: Huntley Dec, Scott     OBESITY 10/24/2010   Qualifier: Diagnosis of  By: Felicita Gage RN, Melinda     Sleep apnea    sleep study on hold   TINNITUS, CHRONIC, RIGHT 09/07/2010   Qualifier: Diagnosis of  By: Delrae Alfred MD, [place] Bailiff LOSS 09/07/2010   Qualifier: Diagnosis of  By: Delrae Alfred MD, Paula Libra VAGINITIS 08/30/2009   Qualifier: Diagnosis of  By: Huntley Dec, Scott     VARICOSE VEINS, LOWER EXTREMITIES 05/05/2009   Qualifier: Diagnosis of  By: Brynda Rim      Past Surgical History:  Procedure Laterality Date   APPENDECTOMY     CESAREAN SECTION WITH BILATERAL TUBAL LIGATION     CHOLECYSTECTOMY     COLONOSCOPY  2011   LEAD REVISION/REPAIR N/A 04/09/2019   Procedure: LEAD REVISION/REPAIR;  Surgeon: Marinus Maw, MD;  Location: [facility];  Service: Cardiovascular;  Laterality: N/A;   PACEMAKER IMPLANT N/A 03/29/2019   Procedure: PACEMAKER IMPLANT;  Surgeon: Marinus Maw, MD;  Location: [facility];  Service: Cardiovascular;  Laterality: N/A;    Family History  Problem Relation  Age of Onset   Hypertension Sister    Hyperlipidemia Sister    Hypertension Sister    Hyperlipidemia Sister    Cancer Sister    Stomach cancer Sister        passed away with the CA   Hypertension Sister    Hyperlipidemia Sister    Migraines Daughter    Diabetes Paternal Grandmother    Hypertension Paternal Grandmother    Hypertension Brother    Hyperlipidemia Brother    Hypertension Brother    Hyperlipidemia Brother    Hypertension Brother    Hyperlipidemia Brother    Hypertension Brother    Hyperlipidemia Brother    Hypertension Brother    Hyperlipidemia Brother    Colon cancer Neg Hx    Colon polyps Neg Hx    Esophageal cancer Neg Hx    Rectal cancer Neg Hx  Breast cancer Neg Hx     Social History Reviewed with no changes to be made today.   Outpatient Medications Prior to Visit  Medication Sig Dispense Refill   escitalopram (LEXAPRO) 10 MG tablet Take 1 tablet (10 mg total) by mouth daily. para la ansiedad 90 tablet 1   triamcinolone cream (KENALOG) 0.1 % Apply 1 application topically 2 (two) times daily. 100 g 0   amLODipine (NORVASC) 10 MG tablet Take 1 tablet (10 mg total) by mouth daily. para la presion arterial 90 tablet 1   gabapentin (NEURONTIN) 100 MG capsule Take 1 capsule (100 mg total) by mouth at bedtime. para el dolor de piernas 90 capsule 3   spironolactone (ALDACTONE) 50 MG tablet Take 1 tablet (50 mg total) by mouth daily. para la presion arterial 90 tablet 1   No facility-administered medications prior to visit.    Allergies  Allergen Reactions   Chlorthalidone     hypokalemia   Lisinopril Other (See Comments)    Chills and arm pain       Objective:    BP (!) 156/86   Pulse 80   Wt 238 lb 9.6 oz (108.2 kg)   SpO2 100%   BMI 45.08 kg/m  Wt Readings from Last 3 Encounters:  01/23/22 238 lb 9.6 oz (108.2 kg)  06/22/21 241 lb 2 oz (109.4 kg)  03/22/21 237 lb 12.8 oz (107.9 kg)    Physical Exam Constitutional:      Appearance: She  is well-developed.  HENT:     Head: Normocephalic and atraumatic.     Right Ear: Hearing, tympanic membrane, ear canal and external ear normal.     Left Ear: Hearing, tympanic membrane, ear canal and external ear normal.     Nose: Nose normal.     Right Turbinates: Not enlarged.     Left Turbinates: Not enlarged.     Mouth/Throat:     Lips: Pink.     Mouth: Mucous membranes are moist.     Dentition: No dental tenderness, gingival swelling, dental abscesses or gum lesions.     Pharynx: No oropharyngeal exudate.  Eyes:     General: No scleral icterus.       Right eye: No discharge.     Extraocular Movements: Extraocular movements intact.     Conjunctiva/sclera: Conjunctivae normal.     Pupils: Pupils are equal, round, and reactive to light.  Neck:     Thyroid: No thyromegaly.     Trachea: No tracheal deviation.  Cardiovascular:     Rate and Rhythm: Normal rate and regular rhythm.     Heart sounds: Normal heart sounds. No murmur heard.    No friction rub.  Pulmonary:     Effort: Pulmonary effort is normal. No accessory muscle usage or respiratory distress.     Breath sounds: Normal breath sounds. No decreased breath sounds, wheezing, rhonchi or rales.  Abdominal:     General: Bowel sounds are normal. There is no distension.     Palpations: Abdomen is soft. There is no mass.     Tenderness: There is no abdominal tenderness. There is no right CVA tenderness, left CVA tenderness, guarding or rebound.     Hernia: No hernia is present.  Musculoskeletal:        General: No tenderness or deformity. Normal range of motion.     Cervical back: Normal range of motion and neck supple.  Lymphadenopathy:     Cervical: No cervical adenopathy.  Skin:  General: Skin is warm and dry.     Findings: No erythema.  Neurological:     Mental Status: She is alert and oriented to person, place, and time.     Cranial Nerves: No cranial nerve deficit.     Motor: Motor function is intact.      Coordination: Coordination is intact. Coordination normal.     Gait: Gait is intact.     Deep Tendon Reflexes:     Reflex Scores:      Patellar reflexes are 1+ on the right side and 1+ on the left side. Psychiatric:        Attention and Perception: Attention normal.        Mood and Affect: Mood normal.        Speech: Speech normal.        Behavior: Behavior normal.        Thought Content: Thought content normal.        Judgment: Judgment normal.          Patient has been counseled extensively about nutrition and exercise as well as the importance of adherence with medications and regular follow-up. The patient was given clear instructions to go to ER or return to medical center if symptoms don't improve, worsen or new problems develop. The patient verbalized understanding.   Follow-up: Return for BP CHECK WITH LUKE or angela if she has opening in 3 weeks/BMP. See me in september.   Gildardo Pounds, FNP-BC [facility] and [facility], [place]   01/23/2022, 3:02 PM

## 2022-01-23 NOTE — Telephone Encounter (Signed)
Requested medication (s) are due for refill today:   Prescribed to day.   Pharmacy needing clarification on dose  Requested medication (s) are on the active medication list:   N/A  Future visit scheduled:   Yes   Last ordered: Today  See pharmacy note.  They got an rx for one a day and another rx for 2 a day.   Which do you want filled?   Requested Prescriptions  Pending Prescriptions Disp Refills   traZODone (DESYREL) 50 MG tablet [Pharmacy Med Name: TRAZODONE '50MG'$       TAB] 90 tablet 1    Sig: TAKE 1 TABLET BY MOUTH AT BEDTIME AS NEEDED FOR SLEEP     Psychiatry: Antidepressants - Serotonin Modulator Passed - 01/23/2022  3:21 PM      Passed - Valid encounter within last 6 months    Recent Outpatient Visits           Today Encounter for annual physical exam   [place], Vernia Buff, NP   7 months ago Essential hypertension   [place], Vernia Buff, NP   10 months ago [place], Vernia Buff, NP   1 year ago Essential hypertension   [place], Taylor, RPH-CPP   1 year ago Essential hypertension   [place], Vernia Buff, NP       Future Appointments             In 4 weeks [place], Dionne Bucy, PA-C [place]   In 3 months Gildardo Pounds, NP [place]

## 2022-01-24 LAB — LIPID PANEL
Chol/HDL Ratio: 4 ratio (ref 0.0–4.4)
Cholesterol, Total: 178 mg/dL (ref 100–199)
HDL: 44 mg/dL (ref >39)
LDL Chol Calc (NIH): 100 mg/dL — ABNORMAL HIGH (ref 0–99)
Triglycerides: 197 mg/dL — ABNORMAL HIGH (ref 0–149)
VLDL Cholesterol Cal: 34 mg/dL (ref 5–40)

## 2022-01-24 LAB — CMP14+EGFR
ALT: 18 IU/L (ref 0–32)
AST: 23 IU/L (ref 0–40)
Albumin/Globulin Ratio: 1.4 (ref 1.2–2.2)
Albumin: 4.7 g/dL (ref 3.8–4.8)
Alkaline Phosphatase: 86 IU/L (ref 44–121)
BUN/Creatinine Ratio: 21 (ref 12–28)
BUN: 19 mg/dL (ref 8–27)
Bilirubin Total: 0.4 mg/dL (ref 0.0–1.2)
CO2: 23 mmol/L (ref 20–29)
Calcium: 9.9 mg/dL (ref 8.7–10.3)
Chloride: 101 mmol/L (ref 96–106)
Creatinine, Ser: 0.89 mg/dL (ref 0.57–1.00)
Globulin, Total: 3.3 g/dL (ref 1.5–4.5)
Glucose: 124 mg/dL — ABNORMAL HIGH (ref 70–99)
Potassium: 4.9 mmol/L (ref 3.5–5.2)
Sodium: 139 mmol/L (ref 134–144)
Total Protein: 8 g/dL (ref 6.0–8.5)
eGFR: 71 mL/min/{1.73_m2} (ref >59)

## 2022-01-24 LAB — CBC
Hematocrit: 42.9 % (ref 34.0–46.6)
Hemoglobin: 14.3 g/dL (ref 11.1–15.9)
MCH: 30.3 pg (ref 26.6–33.0)
MCHC: 33.3 g/dL (ref 31.5–35.7)
MCV: 91 fL (ref 79–97)
Platelets: 250 10*3/uL (ref 150–450)
RBC: 4.72 x10E6/uL (ref 3.77–5.28)
RDW: 12.1 % (ref 11.7–15.4)
WBC: 5.4 10*3/uL (ref 3.4–10.8)

## 2022-01-24 NOTE — Progress Notes (Signed)
Remote pacemaker transmission.   

## 2022-01-28 ENCOUNTER — Other Ambulatory Visit: Payer: Self-pay | Admitting: Nurse Practitioner

## 2022-01-28 DIAGNOSIS — E7800 Pure hypercholesterolemia, unspecified: Secondary | ICD-10-CM

## 2022-01-28 MED ORDER — ATORVASTATIN CALCIUM 20 MG PO TABS
20.0000 mg | ORAL_TABLET | Freq: Every day | ORAL | 3 refills | Status: DC
  Filled 2022-07-19 (×2): qty 30, 30d supply, fill #0
  Filled 2022-08-27 (×2): qty 30, 30d supply, fill #1
  Filled 2022-10-25: qty 30, 30d supply, fill #2

## 2022-02-21 ENCOUNTER — Ambulatory Visit: Payer: No Typology Code available for payment source | Admitting: Physician Assistant

## 2022-03-12 ENCOUNTER — Telehealth: Payer: Self-pay | Admitting: Nurse Practitioner

## 2022-03-12 DIAGNOSIS — I10 Essential (primary) hypertension: Secondary | ICD-10-CM

## 2022-03-12 NOTE — Telephone Encounter (Signed)
[place] called and spoke to [facility], [employer] about the refill(s) amlodipine requested. Advised it was sent on 01/23/22 #90/1 refill(s). She says they have it and patient will be able to pick it up today.

## 2022-03-12 NOTE — Telephone Encounter (Signed)
Medication Refill - Medication: Blood pressure medication.   Has the patient contacted their pharmacy? Yes.    (Agent: If yes, when and what did the pharmacy advise?) Contact PCP office because she has no refills  Preferred Pharmacy (with phone number or street name):  [facility]), [address] Phone:  [phone]  Fax:  [phone]      Has the patient been seen for an appointment in the last year OR does the patient have an upcoming appointment? Yes.    Agent: Please be advised that RX refills may take up to 3 business days. We ask that you follow-up with your pharmacy.  Advised the caller to call the pharmacy and she stated patient has several BP medications and she does not know the name. Educated the caller and stated the pharmacy would be able to determine what BP is due for a refill, caller disconnected the call.

## 2022-03-28 ENCOUNTER — Ambulatory Visit
Admission: RE | Admit: 2022-03-28 | Discharge: 2022-03-28 | Disposition: A | Discharge status: Home / Self Care | Payer: No Typology Code available for payment source | Source: Ambulatory Visit | Attending: Nurse Practitioner | Admitting: Nurse Practitioner

## 2022-03-28 DIAGNOSIS — Z1231 Encounter for screening mammogram for malignant neoplasm of breast: Secondary | ICD-10-CM

## 2022-04-08 ENCOUNTER — Ambulatory Visit (INDEPENDENT_AMBULATORY_CARE_PROVIDER_SITE_OTHER): Payer: Self-pay

## 2022-04-08 DIAGNOSIS — I495 Sick sinus syndrome: Secondary | ICD-10-CM

## 2022-04-09 LAB — CUP PACEART REMOTE DEVICE CHECK
Battery Remaining Longevity: 129 mo
Battery Voltage: 3.02 V
Brady Statistic AP VP Percent: 0.27 %
Brady Statistic AP VS Percent: 96.11 %
Brady Statistic AS VP Percent: 0.04 %
Brady Statistic AS VS Percent: 3.59 %
Brady Statistic RA Percent Paced: 96.36 %
Brady Statistic RV Percent Paced: 0.3 %
Date Time Interrogation Session: 20230829043201
Implantable Lead Implant Date: 20200817
Implantable Lead Implant Date: 20200828
Implantable Lead Location: 753859
Implantable Lead Location: 753860
Implantable Lead Model: 5076
Implantable Lead Model: 5076
Implantable Pulse Generator Implant Date: 20200817
Lead Channel Impedance Value: 361 Ohm
Lead Channel Impedance Value: 399 Ohm
Lead Channel Impedance Value: 437 Ohm
Lead Channel Impedance Value: 475 Ohm
Lead Channel Pacing Threshold Amplitude: 0.375 V
Lead Channel Pacing Threshold Amplitude: 0.625 V
Lead Channel Pacing Threshold Pulse Width: 0.4 ms
Lead Channel Pacing Threshold Pulse Width: 0.4 ms
Lead Channel Sensing Intrinsic Amplitude: 1 mV
Lead Channel Sensing Intrinsic Amplitude: 1 mV
Lead Channel Sensing Intrinsic Amplitude: 10.5 mV
Lead Channel Sensing Intrinsic Amplitude: 10.5 mV
Lead Channel Setting Pacing Amplitude: 1.5 V
Lead Channel Setting Pacing Amplitude: 2.5 V
Lead Channel Setting Pacing Pulse Width: 0.4 ms
Lead Channel Setting Sensing Sensitivity: 1.2 mV

## 2022-04-29 ENCOUNTER — Ambulatory Visit: Payer: No Typology Code available for payment source | Admitting: Nurse Practitioner

## 2022-05-01 NOTE — Progress Notes (Signed)
Remote pacemaker transmission.   

## 2022-07-08 ENCOUNTER — Ambulatory Visit (INDEPENDENT_AMBULATORY_CARE_PROVIDER_SITE_OTHER): Payer: No Typology Code available for payment source

## 2022-07-08 DIAGNOSIS — I495 Sick sinus syndrome: Secondary | ICD-10-CM

## 2022-07-09 LAB — CUP PACEART REMOTE DEVICE CHECK
Battery Remaining Longevity: 126 mo
Battery Voltage: 3.02 V
Brady Statistic AP VP Percent: 0.36 %
Brady Statistic AP VS Percent: 97.19 %
Brady Statistic AS VP Percent: 0.04 %
Brady Statistic AS VS Percent: 2.4 %
Brady Statistic RA Percent Paced: 97.55 %
Brady Statistic RV Percent Paced: 0.4 %
Date Time Interrogation Session: 20231126204359
Implantable Lead Connection Status: 753985
Implantable Lead Connection Status: 753985
Implantable Lead Implant Date: 20200817
Implantable Lead Implant Date: 20200828
Implantable Lead Location: 753859
Implantable Lead Location: 753860
Implantable Lead Model: 5076
Implantable Lead Model: 5076
Implantable Pulse Generator Implant Date: 20200817
Lead Channel Impedance Value: 361 Ohm
Lead Channel Impedance Value: 399 Ohm
Lead Channel Impedance Value: 418 Ohm
Lead Channel Impedance Value: 475 Ohm
Lead Channel Pacing Threshold Amplitude: 0.5 V
Lead Channel Pacing Threshold Amplitude: 0.625 V
Lead Channel Pacing Threshold Pulse Width: 0.4 ms
Lead Channel Pacing Threshold Pulse Width: 0.4 ms
Lead Channel Sensing Intrinsic Amplitude: 11.5 mV
Lead Channel Sensing Intrinsic Amplitude: 11.5 mV
Lead Channel Sensing Intrinsic Amplitude: 4.25 mV
Lead Channel Sensing Intrinsic Amplitude: 4.25 mV
Lead Channel Setting Pacing Amplitude: 1.5 V
Lead Channel Setting Pacing Amplitude: 2.5 V
Lead Channel Setting Pacing Pulse Width: 0.4 ms
Lead Channel Setting Sensing Sensitivity: 1.2 mV
Zone Setting Status: 755011
Zone Setting Status: 755011

## 2022-07-18 ENCOUNTER — Other Ambulatory Visit: Payer: Self-pay

## 2022-07-18 ENCOUNTER — Ambulatory Visit: Payer: Self-pay

## 2022-07-18 ENCOUNTER — Other Ambulatory Visit: Payer: Self-pay | Admitting: Nurse Practitioner

## 2022-07-18 DIAGNOSIS — F418 Other specified anxiety disorders: Secondary | ICD-10-CM

## 2022-07-18 DIAGNOSIS — I10 Essential (primary) hypertension: Secondary | ICD-10-CM

## 2022-07-18 MED ORDER — AMLODIPINE BESYLATE 10 MG PO TABS
10.0000 mg | ORAL_TABLET | Freq: Every day | ORAL | 0 refills | Status: DC
  Filled 2022-07-18: qty 30, 30d supply, fill #0

## 2022-07-18 MED ORDER — TRAZODONE HCL 50 MG PO TABS
50.0000 mg | ORAL_TABLET | Freq: Every evening | ORAL | 0 refills | Status: DC | PRN
  Filled 2022-07-18: qty 90, 90d supply, fill #0

## 2022-07-18 MED ORDER — SPIRONOLACTONE 100 MG PO TABS
100.0000 mg | ORAL_TABLET | Freq: Every day | ORAL | 0 refills | Status: DC
  Filled 2022-07-18: qty 30, 30d supply, fill #0
  Filled 2022-08-23: qty 30, 30d supply, fill #1
  Filled 2022-10-25: qty 30, 30d supply, fill #2

## 2022-07-18 MED ORDER — TRIAMCINOLONE ACETONIDE 0.1 % EX CREA
1.0000 | TOPICAL_CREAM | Freq: Two times a day (BID) | CUTANEOUS | 0 refills | Status: DC
  Filled 2022-07-18: qty 90, 45d supply, fill #0

## 2022-07-18 NOTE — Telephone Encounter (Signed)
Medication Refill - Medication: amLODipine (NORVASC) 10 MG tablet  spironolactone ALDACTONE) 100 MG tablet  traZODone (DESYREL) 50 MG tablet triamcinolone cream (KENALOG) 0.1 %  Has the patient contacted their pharmacy? No. (Pt wants her meds sent to another pharmacy  Preferred Pharmacy (with phone number or street name): [place]  Has the patient been seen for an appointment in the last year OR does the patient have an upcoming appointment? Yes.    Agent: Please be advised that RX refills may take up to 3 business days. We ask that you follow-up with your pharmacy.

## 2022-07-18 NOTE — Telephone Encounter (Addendum)
  Chief Complaint: Pimple like bump under right arm Symptoms: above Frequency: Unsure  - awhile Pertinent Negatives: Patient denies pain itching changes Disposition: '[]'$ ED /'[]'$ Urgent Care (no appt availability in office) / '[]'$ Appointment(In office/virtual)/ '[]'$  [facility]/ '[]'$ Home Care/ '[]'$ Refused Recommended Disposition /'[x]'$ [facility]/ '[]'$  Follow-up with PCP Additional Notes: PT has had a pimple like yellow lump the size of a pencil eraser under her left arm . It is yellow. No pain  or itching. No changes in size or shape.    Summary: lump under arm   Pt's daughter called to make an appt for mom who has a lump under her arm pit area.  Swollen but not painful.  No appt until jan. CB#  [phone]       Reason for Disposition  [1] Small swelling or lump AND [2] unexplained AND [3] present > 1 week  Answer Assessment - Initial Assessment Questions 1. APPEARANCE of SWELLING: "What does it look like?"     Pimple like 2. SIZE: "How large is the swelling?" (e.g., inches, cm; or compare to size of pinhead, tip of pen, eraser, coin, pea, grape, ping pong ball)      Pencil eraser 3. LOCATION: "Where is the swelling located?"     Under arm 4. ONSET: "When did the swelling start?"     Been a long time 5. COLOR: "What color is it?" "Is there more than one color?"     yellow 6. PAIN: "Is there any pain?" If Yes, ask: "How bad is the pain?" (e.g., scale 1-10; or mild, moderate, severe)     - NONE (0): no pain   - MILD (1-3): doesn't interfere with normal activities    - MODERATE (4-7): interferes with normal activities or awakens from sleep    - SEVERE (8-10): excruciating pain, unable to do any normal activities     No pain 7. ITCH: "Does it itch?" If Yes, ask: "How bad is the itch?"      no 8. CAUSE: "What do you think caused the swelling?" 9 OTHER SYMPTOMS: "Do you have any other symptoms?" (e.g., fever)     no  Protocols used: Skin Lump or Localized Swelling-A-AH

## 2022-07-18 NOTE — Telephone Encounter (Signed)
Requested Prescriptions  Pending Prescriptions Disp Refills   amLODipine (NORVASC) 10 MG tablet 90 tablet 0    Sig: Take 1 tablet (10 mg total) by mouth daily. para la presion arterial     Cardiovascular: Calcium Channel Blockers 2 Failed - 07/18/2022 12:04 PM      Failed - Last BP in normal range    BP Readings from Last 1 Encounters:  01/23/22 (!) 156/86         Passed - Last Heart Rate in normal range    Pulse Readings from Last 1 Encounters:  01/23/22 80         Passed - Valid encounter within last 6 months    Recent Outpatient Visits           5 months ago Encounter for annual physical exam   [place], Vernia Buff, NP   1 year ago Essential hypertension   [place], Vernia Buff, NP   1 year ago [place], Vernia Buff, NP   1 year ago Essential hypertension   [place], RPH-CPP   1 year ago Essential hypertension   [place], [place] W, NP               spironolactone (ALDACTONE) 100 MG tablet 90 tablet 0    Sig: Take 1 tablet (100 mg total) by mouth daily. para la presion arterial     Cardiovascular: Diuretics - Aldosterone Antagonist Failed - 07/18/2022 12:04 PM      Failed - Last BP in normal range    BP Readings from Last 1 Encounters:  01/23/22 (!) 156/86         Passed - Cr in normal range and within 180 days    Creat  Date Value Ref Range Status  01/15/2013 0.81 0.50 - 1.10 mg/dL Final   Creatinine, Ser  Date Value Ref Range Status  01/23/2022 0.89 0.57 - 1.00 mg/dL Final         Passed - K in normal range and within 180 days    Potassium  Date Value Ref Range Status  01/23/2022 4.9 3.5 - 5.2 mmol/L Final         Passed - Na in normal range and within 180 days    Sodium  Date Value Ref Range Status  01/23/2022 139 134 - 144 mmol/L  Final         Passed - eGFR is 30 or above and within 180 days    GFR calc Af Amer  Date Value Ref Range Status  09/21/2020 80 >59 mL/min/1.73 Final    Comment:    **In accordance with recommendations from the NKF-ASN Task force,**   [facility] is in the process of updating its eGFR calculation to the   2021 CKD-EPI creatinine equation that estimates kidney function   without a race variable.    GFR calc non Af Amer  Date Value Ref Range Status  09/21/2020 70 >59 mL/min/1.73 Final   eGFR  Date Value Ref Range Status  01/23/2022 71 >59 mL/min/1.73 Final         Passed - Valid encounter within last 6 months    Recent Outpatient Visits           5 months ago Encounter for annual physical exam   [facility] Gildardo Pounds, NP   1 year ago Essential hypertension   [place], Vernia Buff, NP   1 year ago [place], Vernia Buff, NP   1 year ago Essential hypertension   [place], RPH-CPP   1 year ago Essential hypertension   [place], [place] W, NP               traZODone (DESYREL) 50 MG tablet 90 tablet 0    Sig: Take 1 tablet (50 mg total) by mouth at bedtime as needed for sleep. [place]     Psychiatry: Antidepressants - Serotonin Modulator Passed - 07/18/2022 12:04 PM      Passed - Valid encounter within last 6 months    Recent Outpatient Visits           5 months ago Encounter for annual physical exam   [place], Vernia Buff, NP   1 year ago Essential hypertension   [place], Vernia Buff, NP   1 year ago [place] Gildardo Pounds, NP   1 year ago Essential hypertension   [place],  RPH-CPP   1 year ago Essential hypertension   [place], [place] W, NP               triamcinolone cream (KENALOG) 0.1 % 100 g 0    Sig: Apply 1 Application topically 2 (two) times daily.     Not Delegated - Dermatology:  Corticosteroids Failed - 07/18/2022 12:04 PM      Failed - This refill cannot be delegated      Passed - Valid encounter within last 12 months    Recent Outpatient Visits           5 months ago Encounter for annual physical exam   [place], Vernia Buff, NP   1 year ago Essential hypertension   [place], Vernia Buff, NP   1 year ago [place], Vernia Buff, NP   1 year ago Essential hypertension   [place], RPH-CPP   1 year ago Essential hypertension   [place], Zelda W, NP

## 2022-07-18 NOTE — Telephone Encounter (Signed)
Requested medication (s) are due for refill today:routing for review.  Requested medication (s) are on the active medication list: yes  Last refill:  01/23/22  Future visit scheduled:no  Notes to clinic:  Unable to refill per protocol, cannot delegate.      Requested Prescriptions  Pending Prescriptions Disp Refills   triamcinolone cream (KENALOG) 0.1 % 100 g 0    Sig: Apply 1 Application topically 2 (two) times daily.     Not Delegated - Dermatology:  Corticosteroids Failed - 07/18/2022 12:04 PM      Failed - This refill cannot be delegated      Passed - Valid encounter within last 12 months    Recent Outpatient Visits           5 months ago Encounter for annual physical exam   [place], Vernia Buff, NP   1 year ago Essential hypertension   [place], Vernia Buff, NP   1 year ago [place], Zelda W, NP   1 year ago Essential hypertension   [place], RPH-CPP   1 year ago Essential hypertension   [place], Vernia Buff, NP              Signed Prescriptions Disp Refills   amLODipine (NORVASC) 10 MG tablet 90 tablet 0    Sig: Take 1 tablet (10 mg total) by mouth daily. para la presion arterial     Cardiovascular: Calcium Channel Blockers 2 Failed - 07/18/2022 12:04 PM      Failed - Last BP in normal range    BP Readings from Last 1 Encounters:  01/23/22 (!) 156/86         Passed - Last Heart Rate in normal range    Pulse Readings from Last 1 Encounters:  01/23/22 80         Passed - Valid encounter within last 6 months    Recent Outpatient Visits           5 months ago Encounter for annual physical exam   [place], Vernia Buff, NP   1 year ago Essential hypertension   [place], Vernia Buff, NP   1 year ago [place], Vernia Buff, NP   1 year ago Essential hypertension   [place], RPH-CPP   1 year ago Essential hypertension   [place], [place] W, NP               spironolactone (ALDACTONE) 100 MG tablet 90 tablet 0    Sig: Take 1 tablet (100 mg total) by mouth daily. para la presion arterial     Cardiovascular: Diuretics - Aldosterone Antagonist Failed - 07/18/2022 12:04 PM      Failed - Last BP in normal range    BP Readings from Last 1 Encounters:  01/23/22 (!) 156/86         Passed - Cr in normal range and within 180 days    Creat  Date Value Ref Range Status  01/15/2013 0.81 0.50 - 1.10 mg/dL Final   Creatinine, Ser  Date Value Ref Range Status  01/23/2022 0.89 0.57 - 1.00 mg/dL Final  Passed - K in normal range and within 180 days    Potassium  Date Value Ref Range Status  01/23/2022 4.9 3.5 - 5.2 mmol/L Final         Passed - Na in normal range and within 180 days    Sodium  Date Value Ref Range Status  01/23/2022 139 134 - 144 mmol/L Final         Passed - eGFR is 30 or above and within 180 days    GFR calc Af Amer  Date Value Ref Range Status  09/21/2020 80 >59 mL/min/1.73 Final    Comment:    **In accordance with recommendations from the NKF-ASN Task force,**   [facility] is in the process of updating its eGFR calculation to the   2021 CKD-EPI creatinine equation that estimates kidney function   without a race variable.    GFR calc non Af Amer  Date Value Ref Range Status  09/21/2020 70 >59 mL/min/1.73 Final   eGFR  Date Value Ref Range Status  01/23/2022 71 >59 mL/min/1.73 Final         Passed - Valid encounter within last 6 months    Recent Outpatient Visits           5 months ago Encounter for annual physical exam   [place], Vernia Buff, NP   1 year ago Essential hypertension   [place], Vernia Buff, NP   1 year ago [place], Zelda W, NP   1 year ago Essential hypertension   [place], RPH-CPP   1 year ago Essential hypertension   [place], Vernia Buff, NP               traZODone (DESYREL) 50 MG tablet 90 tablet 0    Sig: Take 1 tablet (50 mg total) by mouth at bedtime as needed for sleep. [place]     Psychiatry: Antidepressants - Serotonin Modulator Passed - 07/18/2022 12:04 PM      Passed - Valid encounter within last 6 months    Recent Outpatient Visits           5 months ago Encounter for annual physical exam   [place], Vernia Buff, NP   1 year ago Essential hypertension   [place], Vernia Buff, NP   1 year ago [place], Vernia Buff, NP   1 year ago Essential hypertension   [place], RPH-CPP   1 year ago Essential hypertension   [place], Zelda W, NP

## 2022-07-19 ENCOUNTER — Other Ambulatory Visit: Payer: Self-pay

## 2022-07-23 ENCOUNTER — Other Ambulatory Visit: Payer: Self-pay | Admitting: Nurse Practitioner

## 2022-07-23 ENCOUNTER — Telehealth: Payer: Self-pay | Admitting: Nurse Practitioner

## 2022-07-23 ENCOUNTER — Other Ambulatory Visit: Payer: Self-pay

## 2022-07-23 ENCOUNTER — Ambulatory Visit
Admission: EM | Admit: 2022-07-23 | Discharge: 2022-07-23 | Disposition: A | Discharge status: Home / Self Care | Payer: Self-pay

## 2022-07-23 ENCOUNTER — Encounter: Payer: Self-pay | Admitting: Emergency Medicine

## 2022-07-23 DIAGNOSIS — N644 Mastodynia: Secondary | ICD-10-CM

## 2022-07-23 DIAGNOSIS — L989 Disorder of the skin and subcutaneous tissue, unspecified: Secondary | ICD-10-CM

## 2022-07-23 NOTE — Telephone Encounter (Signed)
Will order ultrasound for breast pain. She needs to follow up with them to schedule as she is uninsured this will likely need to go through the scholarship program.  The lesion on the arm can be scheduled with me at a later date or angela if she has any openings. The notes states the lesion appears to be a mole of some kind. Will need to evaluate in office first.

## 2022-07-23 NOTE — Telephone Encounter (Signed)
Copied from [place] [id number]. Topic: Appointment Scheduling - Scheduling Inquiry for Clinic >> Jul 23, 2022 10:09 AM Erskine Squibb wrote: Reason for CRM: The daughter of the patient called in stating the patient was just seen at [facility] and the provider there said she felt a mass on her right arm near her arm pit and has encouraged her to call her provider to set up an Ultrasound to further diagnose this. The provider at the Urgent Care was putting in notes in her mychart for her provider to see. Please assist patient further

## 2022-07-23 NOTE — ED Provider Notes (Signed)
[facility]    CSN: [id number] Arrival date & time: 07/23/22  2542      History   Chief Complaint Chief Complaint  Patient presents with   Abscess    HPI Anita [name] Pain is a 66 y.o. female.   Patient presents with her family member who helps provide history.  Patient wishes for this family member to help interpret.  Patient presents with 2 different chief complaints today.  She reports that she has a lesion/bump present to right upper arm.  She reports it has been present for multiple years but recently started causing some pain and discomfort over the past few days.  Denies any obvious injury to the area.  Denies any purulent drainage from the area.  Denies any associated fever.  She also reports that she started having some right breast pain a few days ago as well.  Denies any injury to the breast.  She states that she had a mammogram in August that was negative but was not having the pain at that time.  Denies any associated nipple discharge, discoloration, swelling.   Abscess   Past Medical History:  Diagnosis Date   ABDOMINAL PAIN 05/05/2009   Qualifier: Diagnosis of  By: Jorene Minors, Scott     Adjustment disorder with depressed mood 08/30/2009   Qualifier: Diagnosis of  By: Jorene Minors, Scott     ALLERGIC RHINITIS 12/04/2009   Qualifier: Diagnosis of  By: Jorene Minors, Scott     Allergy    Anxiety    Aortic stenosis    mild by echo 03/2019   CHOLELITHIASIS 05/30/2009   Qualifier: Diagnosis of  By: Jorene Minors, Scott     COLONIC POLYPS, HX OF 05/09/2010   Qualifier: Diagnosis of  By: Versie Starks     Depression    Essential hypertension, benign 05/05/2009   Qualifier: Diagnosis of  By: Jorene Minors, Scott     GERD 06/12/2009   Qualifier: Diagnosis of  By: Jorene Minors, Scott     Heart murmur    Pacemaker   Hypertension    KNEE PAIN 06/12/2009   Qualifier: Diagnosis of  By: Versie Starks     LIVER MASS 05/30/2009   Qualifier: Diagnosis of  By:  Jorene Minors, Scott     OBESITY 10/24/2010   Qualifier: Diagnosis of  By: Keenan Bachelor RN, Melinda     Sleep apnea    sleep study on hold   TINNITUS, CHRONIC, RIGHT 09/07/2010   Qualifier: Diagnosis of  By: Amil Amen MD, [place]bert Nan LOSS 09/07/2010   Qualifier: Diagnosis of  By: Amil Amen MD, [employer] 08/30/2009   Qualifier: Diagnosis of  By: Jorene Minors, Scott     VARICOSE VEINS, LOWER EXTREMITIES 05/05/2009   Qualifier: Diagnosis of  By: Jorene Minors, [place]      Patient Active Problem List   Diagnosis Date Noted   Sinus node dysfunction ([place]) 07/12/2019   Pacemaker 70/62/3762   Complication associated with cardiac pacemaker lead 04/09/2019   Pacemaker lead failure, 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   Syncope 03/27/2019   Aortic stenosis    BMI 45.0-49.9, adult ([place]) 10/19/2014   Venous stasis dermatitis of left lower extremity 10/19/2014   OBESITY 10/24/2010   TINNITUS, CHRONIC, RIGHT 09/07/2010   TOOTH LOSS 09/07/2010   COLONIC POLYPS, HX OF 05/09/2010   ALLERGIC RHINITIS 12/04/2009   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   ADJUSTMENT DISORDER WITH DEPRESSED MOOD 08/30/2009   GERD 06/12/2009  KNEE PAIN 06/12/2009   LIVER MASS 05/30/2009   CHOLELITHIASIS 05/30/2009   ESSENTIAL HYPERTENSION, BENIGN 05/05/2009   VARICOSE VEINS, LOWER EXTREMITIES 05/05/2009   ABDOMINAL PAIN 05/05/2009    Past Surgical History:  Procedure Laterality Date   APPENDECTOMY     CESAREAN SECTION WITH BILATERAL TUBAL LIGATION     CHOLECYSTECTOMY     COLONOSCOPY  2011   LEAD REVISION/REPAIR N/A 04/09/2019   Procedure: LEAD REVISION/REPAIR;  Surgeon: Evans Lance, MD;  Location: [facility];  Service: Cardiovascular;  Laterality: N/A;   PACEMAKER IMPLANT N/A 03/29/2019   Procedure: PACEMAKER IMPLANT;  Surgeon: Evans Lance, MD;  Location: [facility];  Service: Cardiovascular;  Laterality: N/A;    OB History     Gravida  5   Para      Term      Preterm      AB       Living  5      SAB      IAB      Ectopic      Multiple      Live Births  5        Obstetric Comments  Last delivery was a c-section          Home Medications    Prior to Admission medications   Medication Sig Start Date End Date Taking? Authorizing Provider  amLODipine (NORVASC) 10 MG tablet Take 1 tablet (10 mg total) by mouth daily. para la presion arterial 07/18/22   Gildardo Pounds, NP  atorvastatin (LIPITOR) 20 MG tablet Take 1 tablet (20 mg total) by mouth daily. 01/28/22   Gildardo Pounds, NP  escitalopram (LEXAPRO) 10 MG tablet Take 1 tablet (10 mg total) by mouth daily. para la ansiedad 01/23/22   Gildardo Pounds, NP  gabapentin (NEURONTIN) 100 MG capsule Take 1 capsule (100 mg total) by mouth at bedtime. para el dolor de piernas 01/23/22   Gildardo Pounds, NP  spironolactone (ALDACTONE) 100 MG tablet Take 1 tablet (100 mg total) by mouth daily. para la presion arterial 07/18/22   Gildardo Pounds, NP  traZODone (DESYREL) 50 MG tablet Take 1 tablet (50 mg total) by mouth at bedtime as needed for sleep. PARA DORMIR 07/18/22   Gildardo Pounds, NP  triamcinolone cream (KENALOG) 0.1 % Apply 1 Application topically 2 (two) times daily. 07/18/22   Charlott Rakes, MD    Family History Family History  Problem Relation Age of Onset   Hypertension Sister    Hyperlipidemia Sister    Hypertension Sister    Hyperlipidemia Sister    Cancer Sister    Stomach cancer Sister        passed away with the CA   Hypertension Sister    Hyperlipidemia Sister    Migraines Daughter    Diabetes Paternal Grandmother    Hypertension Paternal Grandmother    Hypertension Brother    Hyperlipidemia Brother    Hypertension Brother    Hyperlipidemia Brother    Hypertension Brother    Hyperlipidemia Brother    Hypertension Brother    Hyperlipidemia Brother    Hypertension Brother    Hyperlipidemia Brother    Colon cancer Neg Hx    Colon polyps Neg Hx    Esophageal cancer Neg Hx     Rectal cancer Neg Hx    Breast cancer Neg Hx     Social History Social History   Tobacco Use   Smoking  status: Never   Smokeless tobacco: Never  Vaping Use   Vaping Use: Never used  Substance Use Topics   Alcohol use: No    Alcohol/week: 0.0 standard drinks of alcohol   Drug use: No     Allergies   Chlorthalidone and Lisinopril   Review of Systems Review of Systems Per HPI  Physical Exam Triage Vital Signs ED Triage Vitals [07/23/22 0941]  Enc Vitals Group     BP (!) 142/88     Pulse Rate 83     Resp 18     Temp (!) 97.5 F (36.4 C)     Temp src      SpO2 97 %     Weight      Height      Head Circumference      Peak Flow      Pain Score 5     Pain Loc      Pain Edu?      Excl. in [place]?    No data found.  Updated Vital Signs BP (!) 142/88   Pulse 83   Temp (!) 97.5 F (36.4 C)   Resp 18   SpO2 97%   Visual Acuity Right Eye Distance:   Left Eye Distance:   Bilateral Distance:    Right Eye Near:   Left Eye Near:    Bilateral Near:     Physical Exam Exam conducted with a chaperone present.  Constitutional:      General: She is not in acute distress.    Appearance: Normal appearance. She is not toxic-appearing or diaphoretic.  HENT:     Head: Normocephalic and atraumatic.  Eyes:     Extraocular Movements: Extraocular movements intact.     Conjunctiva/sclera: Conjunctivae normal.  Pulmonary:     Effort: Pulmonary effort is normal.  Chest:       Comments: Patient reports tenderness to palpation to right upper quadrant of breast.  There are no obvious masses, lesions, palpable abnormalities noted throughout breast.  Nipple appears normal.  No nipple discharge noted. Skin:    Comments: Patient has approximately 1.5 to 2 cm in diameter slightly raised flesh-colored lesion present to right upper posterior arm about 2 to 3 inches from axilla.  There is no discoloration or drainage.  Surrounding this lesion there is some swelling with no  discoloration.  The swelling feels spongy.  Neurological:     General: No focal deficit present.     Mental Status: She is alert and oriented to person, place, and time. Mental status is at baseline.  Psychiatric:        Mood and Affect: Mood normal.        Behavior: Behavior normal.        Thought Content: Thought content normal.        Judgment: Judgment normal.      UC Treatments / Results  Labs (all labs ordered are listed, but only abnormal results are displayed) Labs Reviewed - No data to display  EKG   Radiology No results found.  Procedures Procedures (including critical care time)  Medications Ordered in UC Medications - No data to display  Initial Impression / Assessment and Plan / UC Course  I have reviewed the triage vital signs and the nursing notes.  Pertinent labs & imaging results that were available during my care of the patient were reviewed by me and considered in my medical decision making (see chart for details).     Has  lesion to right upper arm that is consistent with possible mole or benign skin lesion.  Surrounding this though, there is concern for lipoma versus cyst.  No concern for bacterial infection or abscess at this time.  I do think that this warrants ultrasound to determine exact etiology.  Do not have these capabilities here in urgent care so patient was advised to follow-up with PCP for further evaluation and management of this.  No obvious abnormality or mastitis noted on breast exam that could be causing patient's breast pain. No need for antibiotics. I do think this warrants further imaging with a mammogram and/or ultrasound.  Patient's information was sent over to breast imaging center to have this completed.  Advised patient that they will reach out to her to schedule an appointment.  Patient was given contact information to call them if they do not call her in the next few days.  Patient was given strict return precautions.  Do not think  that patient's two chief complaints are related. Patient verbalized understanding and was agreeable with plan.  Patient declined interpreter and wished for her family member to interpret. Final Clinical Impressions(s) / UC Diagnoses   Final diagnoses:  Skin lesion  Breast pain     Discharge Instructions      Please follow-up with primary care doctor to receive an ultrasound of the lesion on the arm to determine exactly what it is.  Also recommend that she get mammogram/ultrasound of the right breast to determine etiology of patient's pain.  I have sent over patient's information to the breast imaging center of [place].  Someone will reach out to her to schedule an appointment.  If you do not hear from them in the next few days, please call them at provided phone number.    ED Prescriptions   None    PDMP not reviewed this encounter.   Teodora Medici, [place] 07/23/22 1129

## 2022-07-23 NOTE — Discharge Instructions (Signed)
Please follow-up with primary care doctor to receive an ultrasound of the lesion on the arm to determine exactly what it is.  Also recommend that she get mammogram/ultrasound of the right breast to determine etiology of patient's pain.  I have sent over patient's information to the breast imaging center of [place].  Someone will reach out to her to schedule an appointment.  If you do not hear from them in the next few days, please call them at provided phone number.

## 2022-07-23 NOTE — ED Triage Notes (Signed)
Pt is present today with a bump on her right arm. Pt states that the bump is causing pain to radiated to through her arm to her chest. Pt states that she noticed the bump years ago but the pain started last week.

## 2022-07-24 ENCOUNTER — Other Ambulatory Visit: Payer: Self-pay | Admitting: Hematology and Oncology

## 2022-07-24 ENCOUNTER — Other Ambulatory Visit: Payer: Self-pay

## 2022-07-24 DIAGNOSIS — N644 Mastodynia: Secondary | ICD-10-CM

## 2022-07-24 NOTE — Telephone Encounter (Signed)
 imaging called and pt was advised  that she was approved for the pink slip for a mammogram / pts daughter called [facility] to see if they need to still come to the practice to complete any paperwork for the ultra sound / or does the pink slip approval include an ultrasound /please advise

## 2022-07-24 NOTE — Telephone Encounter (Signed)
Return call answered and question clarified.

## 2022-07-24 NOTE — Telephone Encounter (Signed)
Patients daughter is aware of response from provider. Daughter stated she will come to the office at her earliest convince to have her mother fill out the paper work. Appt for further evaluation of lesion scheduled.

## 2022-08-19 ENCOUNTER — Ambulatory Visit: Payer: Self-pay | Attending: Nurse Practitioner | Admitting: Physician Assistant

## 2022-08-19 ENCOUNTER — Encounter: Payer: Self-pay | Admitting: Physician Assistant

## 2022-08-19 ENCOUNTER — Other Ambulatory Visit: Payer: Self-pay

## 2022-08-19 VITALS — BP 145/85 | HR 85 | Wt 234.2 lb

## 2022-08-19 DIAGNOSIS — M62838 Other muscle spasm: Secondary | ICD-10-CM

## 2022-08-19 DIAGNOSIS — Z09 Encounter for follow-up examination after completed treatment for conditions other than malignant neoplasm: Secondary | ICD-10-CM

## 2022-08-19 DIAGNOSIS — L989 Disorder of the skin and subcutaneous tissue, unspecified: Secondary | ICD-10-CM

## 2022-08-19 DIAGNOSIS — N644 Mastodynia: Secondary | ICD-10-CM

## 2022-08-19 DIAGNOSIS — H6990 Unspecified Eustachian tube disorder, unspecified ear: Secondary | ICD-10-CM

## 2022-08-19 MED ORDER — DICLOFENAC SODIUM 1 % EX GEL
2.0000 g | Freq: Four times a day (QID) | CUTANEOUS | 3 refills | Status: DC
  Filled 2022-08-19: qty 100, fill #0

## 2022-08-19 MED ORDER — FLUTICASONE PROPIONATE 50 MCG/ACT NA SUSP
2.0000 | Freq: Every day | NASAL | 6 refills | Status: DC
  Filled 2022-08-19: qty 16, 30d supply, fill #0

## 2022-08-19 NOTE — Progress Notes (Signed)
Patient ID: Anita [name], female   DOB: December 06, 1955, 67 y.o.   MRN: [id number]    Anita [name], is a 67 y.o. female  WEX:[id number]  LFY:[id number]  DOB - [birth year]  Chief Complaint  Patient presents with   Nevus    On the back of right arm        Subjective:   Anita [name] Pain is a 67 y.o. female here today for a follow up visit after being seen at [facility] for breast pain and mammo scheduled.  She also has a little fleshy area on her proximal R arm she wants to have checked.  It is not painful or problematic.  No bleeding.  No itching.  It has been there for years.    Also c/o ringing in ears and ear pain esp in the winter.    Also c/o pain in the L upper neck/trapezius area that comes and goes for a while now.  No UE weakness or injury.  NKI.   No problems updated.  ALLERGIES: Allergies  Allergen Reactions   Chlorthalidone     hypokalemia   Lisinopril Other (See Comments)    Chills and arm pain    PAST MEDICAL HISTORY: Past Medical History:  Diagnosis Date   ABDOMINAL PAIN 05/05/2009   Qualifier: Diagnosis of  By: Jorene Minors, Scott     Adjustment disorder with depressed mood 08/30/2009   Qualifier: Diagnosis of  By: Jorene Minors, Scott     ALLERGIC RHINITIS 12/04/2009   Qualifier: Diagnosis of  By: Jorene Minors, Scott     Allergy    Anxiety    Aortic stenosis    mild by echo 03/2019   CHOLELITHIASIS 05/30/2009   Qualifier: Diagnosis of  By: Jorene Minors, Scott     COLONIC POLYPS, HX OF 05/09/2010   Qualifier: Diagnosis of  By: Versie Starks     Depression    Essential hypertension, benign 05/05/2009   Qualifier: Diagnosis of  By: Jorene Minors, Scott     GERD 06/12/2009   Qualifier: Diagnosis of  By: Jorene Minors, Scott     Heart murmur    Pacemaker   Hypertension    KNEE PAIN 06/12/2009   Qualifier: Diagnosis of  By: Versie Starks     LIVER MASS 05/30/2009   Qualifier: Diagnosis of  By: Jorene Minors, Scott     OBESITY 10/24/2010   Qualifier: Diagnosis of   By: Keenan Bachelor RN, Melinda     Sleep apnea    sleep study on hold   TINNITUS, CHRONIC, RIGHT 09/07/2010   Qualifier: Diagnosis of  By: Amil Amen MD, Talbert Nan LOSS 09/07/2010   Qualifier: Diagnosis of  By: Amil Amen MD, [place] 08/30/2009   Qualifier: Diagnosis of  By: Jorene Minors, Scott     VARICOSE VEINS, LOWER EXTREMITIES 05/05/2009   Qualifier: Diagnosis of  By: Versie Starks      MEDICATIONS AT HOME: Prior to Admission medications   Medication Sig Start Date End Date Taking? Authorizing Provider  amLODipine (NORVASC) 10 MG tablet Take 1 tablet (10 mg total) by mouth daily. para la presion arterial 07/18/22  Yes Gildardo Pounds, NP  atorvastatin (LIPITOR) 20 MG tablet Take 1 tablet (20 mg total) by mouth daily. 01/28/22  Yes Gildardo Pounds, NP  diclofenac Sodium (VOLTAREN ARTHRITIS PAIN) 1 % GEL Apply 2 g topically 4 (four) times daily. 08/19/22  Yes Freeman Caldron M, PA-C  escitalopram (LEXAPRO) 10  MG tablet Take 1 tablet (10 mg total) by mouth daily. para la ansiedad 01/23/22  Yes Gildardo Pounds, NP  fluticasone (FLONASE) 50 MCG/ACT nasal spray Place 2 sprays into both nostrils daily. 08/19/22  Yes Argentina Donovan, PA-C  gabapentin (NEURONTIN) 100 MG capsule Take 1 capsule (100 mg total) by mouth at bedtime. para el dolor de piernas 01/23/22  Yes Gildardo Pounds, NP  spironolactone (ALDACTONE) 100 MG tablet Take 1 tablet (100 mg total) by mouth daily. para la presion arterial 07/18/22  Yes Gildardo Pounds, NP  traZODone (DESYREL) 50 MG tablet Take 1 tablet (50 mg total) by mouth at bedtime as needed for sleep. PARA DORMIR 07/18/22  Yes Gildardo Pounds, NP  triamcinolone cream (KENALOG) 0.1 % Apply 1 Application topically 2 (two) times daily. 07/18/22  Yes Newlin, Enobong, MD    ROS: Neg HEENT Neg resp Neg cardiac Neg GI Neg GU Neg psych Neg neuro  Objective:   Vitals:   08/19/22 1026  BP: (!) 145/85  Pulse: 85  SpO2: 98%  Weight: 234  lb 3.2 oz (106.2 kg)   Exam General appearance : Awake, alert, not in any distress. Speech Clear. Not toxic looking HEENT: Atraumatic and Normocephalic.  BTM bulging without infection Neck: Supple, no JVD. No cervical lymphadenopathy.  Chest: Good air entry bilaterally, CTAB.  No rales/rhonchi/wheezing CVS: S1 S2 regular, no murmurs.  Spasm that is tender in L upper trapezius R upper arm, posterior surface.  There is a <1cm fleshy raised area that seems like a small lipoma.  No discolorations of overlying skin.   Extremities: B/L Lower Ext shows no edema, both legs are warm to touch Neurology: Awake alert, and oriented X 3, CN II-XII intact, Non focal Skin: No Rash  Data Review Lab Results  Component Value Date   HGBA1C 5.4 % 01/15/2013    Assessment & Plan   1. Breast pain, right Has mammogram scheduled 08/27/2022.  Patient verbalizes being aware of   2. Skin lesion Benign.  Family and patient to watch for changes.  Likely lipoma  3. Dysfunction of Eustachian tube, unspecified laterality - fluticasone (FLONASE) 50 MCG/ACT nasal spray; Place 2 sprays into both nostrils daily.  Dispense: 16 g; Refill: 6  4. Encounter for examination following treatment at hospital Doing well  5. Muscle spasm - diclofenac Sodium (VOLTAREN ARTHRITIS PAIN) 1 % GEL; Apply 2 g topically 4 (four) times daily.  Dispense: 100 g; Refill: 3  Daughter interpreted  Return in about 3 months (around 11/18/2022) for PCP for chronic conditions.  The patient was given clear instructions to go to ER or return to medical center if symptoms don't improve, worsen or new problems develop. The patient verbalized understanding. The patient was told to call to get lab results if they haven't heard anything in the next week.      Freeman Caldron, PA-C [facility] [place], [place]   08/19/2022, 11:02 AM

## 2022-08-19 NOTE — Patient Instructions (Signed)
Tinnitus Tinnitus El trmino tinnitus hace referencia a la percepcin de un sonido que no se corresponde con ninguna fuente real para ese sonido. A menudo se lo describe como zumbido de odos. Sin embargo, las [employer] varios das. Puede desaparecer sin tratamiento y regresar en distintos momentos. Cuando el tinnitus es Agricultural engineer u ocurre con Camera operator, puede causar otros problemas, por sufren esta afeccin pueden percibir diferentes ruidos, en uno o ambos odos. Los sonidos del tinnitus pueden ser [place], fuertes o de intensidad intermedia. El tinnitus puede prolongarse pocos segundos o ser constante durante ejemplo, dificultad para dormir y para concentrarse. Casi todas las Engineer, manufacturing tinnitus en algn momento. El tinnitus no es lo mismo que la prdida [place]. El tinnitus a Barrister's clerk (crnico) o que regresa con frecuencia (recurrente) es un problema que puede requerir Geophysical data processor. Cules son las causas? A menudo se desconoce la causa del tinnitus. En algunos casos, puede ser consecuencia de lo siguiente: Exposicin a ruidos fuertes de maquinarias, [place] u otras fuentes. Un objeto (cuerpo extrao) atascado en el odo. Acumulacin de cerumen. El consumo de alcohol o cafena. Tomar ciertos medicamentos. La prdida [place] relacionada con la edad. Tambin puede ser causada por afecciones mdicas tales como: Infecciones de los odos o de los senos paranasales. Enfermedades cardacas o hipertensin arterial. Alergias. Enfermedad de [place]. Problemas de tiroides. Tumores. Un vaso sanguneo dbil o abultado (aneurisma) cerca del odo. Qu incrementa el riesgo? Los siguientes factores pueden hacer que sea ms propenso a Emergency planning/management officer afeccin: Exposicin a ruidos fuertes. La edad. Es ms probable que el tinnitus se presente en personas de edad avanzada. Consumo de alcohol o tabaco. Cules son los signos o sntomas? El principal sntoma de tinnitus es la percepcin de un  sonido que no se corresponde con ninguna fuente, no proviene de Horticulturist, commercial. El sonido puede percibirse como lo siguiente: Vibracin. Chisporroteo. Zumbido. Soplido de aire. Sibilancia. Silbido. Otros sonidos pueden ser: Crepitacin. Una corriente de agua. Una nota musical. Golpecitos. Runrn. Es posible que los sntomas afecten un solo odo (unilateral) o ambos odos (bilateral). Cmo se diagnostica? El tinnitus se diagnostica en funcin de los sntomas, antecedentes mdicos y un examen fsico. El mdico puede realizar una prueba auditiva exhaustiva (examen de audicin) si el tinnitus: Es unilateral. Causa dificultades [phone]. Dura ms de 6 meses. Usted puede trabajar con un mdico especialista en trastornos [employer] (audilogo). Le pueden hacer preguntas sobre sus sntomas y cmo estos afectan su vida diaria. Es posible que le hagan algunas pruebas, por ejemplo: Exploracin por tomografa computarizada (TC). Resonancia magntica (RM). Un estudio de diagnstico por imgenes de cmo fluye la sangre a travs de los vasos sanguneos (angiograma). Cmo se trata? A veces, el tratamiento de una enfermedad preexistente hace que el tinnitus desaparezca. Si el tinnitus contina, probablemente debe realizar [employer] siguientes tratamientos, [place] otros: Terapia y orientacin para ayudarlo a Chief Technology Officer el estrs que significa vivir con tinnitus. Generadores de sonido para enmascarar el tinnitus. Esto incluye lo siguiente: Aparatos de sonido de mesa que reproducen sonidos relajantes para ayudarlo a dormir. Dispositivos inteligentes que se adaptan al odo y reproducen sonidos o [place]. Estimulacin Facilities manager usar auriculares para escuchar msica que contiene Scientist, research (physical sciences). Con el tiempo, escuchar esta seal puede modificar las redes del cerebro y reducir la sensibilidad al tinnitus. Este tratamiento se [place] en casos muy graves cuando ningn otro tratamiento  resulta eficaz. El uso de audfonos o implantes cocleares, si el tinnitus guarda relacin con la  prdida de la audicin. Los audfonos se usan en el odo externo. Implantes cocleares se colocan quirrgicamente en el odo interno. Siga estas instrucciones en su casa: Controlar los sntomas     Cuando sea posible, no permanezca en lugares ruidosos y no se exponga a sonidos fuertes. Use dispositivos de proteccin de la audicin, por ejemplo, tapones, cuando est expuesto a ruidos fuertes. Use un aparato de sonido de fondo, un humidificador u otros dispositivos para enmascarar el sonido del tinnitus. Ponga en prctica tcnicas para reducir el estrs, como meditacin, yoga o respiracin profunda. Trabaje con el mdico si necesita ayuda para Engineer, maintenance (IT). Duerma con la cabeza levemente elevada. Esto puede reducir el impacto del tinnitus. Instrucciones generales No use sustancias estimulantes, como nicotina, alcohol o cafena. Hable con el mdico sobre otros estimulantes que Product/process development scientist. Los estimulantes son sustancias que pueden hacer que se sienta alerta y atento al aumentar determinadas actividades en el cuerpo (por ejemplo, la frecuencia cardaca y la presin arterial). Estas sustancias pueden empeorar el tinnitus. Use los medicamentos de venta libre y los recetados solamente como se lo haya indicado el mdico. Intente dormir mucho todas las noches. Concurra a [place]. Esto es importante. Comunquese con un mdico si: El tinnitus se prolonga durante 3 semanas o ms tiempo y no se detiene. Pierde la audicin de forma repentina. Los sntomas empeoran o no mejoran con los Multimedia programmer. Siente que no puede controlar el estrs que le provoca vivir con tinnitus. Solicite ayuda de inmediato si: Experimenta tinnitus despus de sufrir una lesin en la cabeza. Tiene tinnitus junto con alguno de estos sntomas: Mareos. Nuseas y vmitos. Prdida del  equilibrio. Dolor de cabeza repentino e intenso. Cambios en la visin. Debilidad facial o debilidad de brazos o piernas. Estos sntomas pueden representar un problema grave que constituye Engineer, maintenance (IT). No espere a ver si los sntomas desaparecen. Solicite atencin mdica de inmediato. Comunquese con el servicio de emergencias de su localidad (911 en los [place]). No conduzca por sus propios medios [employer] hospital. Resumen El trmino tinnitus hace referencia a la percepcin de un sonido que no se corresponde con ninguna fuente real para ese sonido. A menudo se lo describe como zumbido de odos. Es posible que los sntomas afecten un solo odo (unilateral) o ambos odos (bilateral). Use un aparato de sonido de fondo, un humidificador u otros dispositivos para enmascarar el sonido del tinnitus. No use sustancias estimulantes, como nicotina, alcohol o cafena. Estas sustancias pueden empeorar el tinnitus. Esta informacin no tiene Marine scientist el consejo del mdico. Asegrese de hacerle al mdico cualquier pregunta que tenga. Document Revised: 07/24/2020 Document Reviewed: 07/24/2020 Elsevier Patient Education  [place] trompa de Eustaquio Eustachian Tube Dysfunction  Disfuncin de la trompa de Eustaquio hace referencia a una afeccin en la que se forma una obstruccin en el pasaje estrecho que conecta el odo medio con la parte posterior de la nariz (trompa de [place]). La trompa de [employer] regula la presin del aire en el odo medio al permitir que el aire se mueva entre el odo y la Doran Durand. Tambin ayuda a Musician lquido del espacio del odo [place]. La disfuncin de la trompa de Eustaquio puede afectar a uno o ambos odos. Cuando la trompa de Eustaquio no funciona correctamente, la presin de aire, de lquido, o ambas pueden acumularse en el odo medio. Cules son las causas? Esta afeccin se produce cuando la trompa de  Eustaquio se obstruye o no  puede abrirse normalmente. Las causas ms frecuentes de esta afeccin incluyen las siguientes: Infecciones en los odos. Resfriados y otras infecciones que afectan la nariz, la boca y la garganta (vas respiratorias superiores). Alergias. Irritacin por el humo del cigarrillo. Irritacin por el retroceso de cido estomacal hacia el esfago (reflujo gastroesofgico). El esfago es el rgano del cuerpo que transporta los alimentos desde la boca al [place]. Cambios sbitos en la presin del aire, como cuando baja el avin o cuando se practica buceo. Crecimientos anormales en la nariz o la garganta, por ejemplo: Crecimientos en el interior de la nariz (plipos nasales). Crecimiento anormal de clulas (tumores). Agrandamiento de tejido en la parte posterior de la garganta (adenoides). Qu incrementa el riesgo? Es ms probable que desarrolle esta afeccin si: Fuma. Tiene sobrepeso. Es un nio que tiene: Ciertos defectos congnitos de la boca, como fisura Field seismologist. Amgdalas o adenoides grandes. Cules son los signos o sntomas? Los sntomas frecuentes de esta afeccin incluyen los siguientes: Sensacin de que el odo est lleno. Dolor de odo. Ruidos de chasquidos o estallidos en el odo. Zumbido en el odo (acfenos). Prdida auditiva. Prdida del equilibrio. Mareos. Los sntomas pueden empeorar cuando la presin del aire que lo rodea cambia, como cuando viaja a una zona de gran elevacin, vuela en avin o practica buceo. Cmo se diagnostica? Esta afeccin se puede diagnosticar en funcin de lo siguiente: Sus sntomas. Un examen fsico de los odos, la nariz y Patent examiner. Pruebas, como por ejemplo aquellos que miden: El movimiento del tmpano. Su audicin Lorel Monaco). Cmo se trata? El tratamiento depende de la causa y de la gravedad de Engineer, manufacturing systems. En los casos leves, es posible [employer] sntomas con el movimiento de aire [place] odos. Esto se denomina "destaparse los  odos". En los casos ms graves, o si tiene sntomas de lquido [employer] odos, el tratamiento puede incluir: Medicamentos para Human resources officer congestin (descongestivos). Medicamentos para tratar alergias (antihistamnicos). Aerosoles nasales o gotas ticas que contengan medicamentos que reduzcan la inflamacin (corticoesteroides). Un procedimiento para drenar el lquido del tmpano. En este procedimiento, se coloca un tubo pequeo en el tmpano para: Drenar el lquido. Restablecer el aire en el espacio del odo medio. Un procedimiento para insertar un dispositivo de baln a travs de la nariz para inflar la abertura de la trompa de Eustaquio (dilatacin con baln). Siga estas instrucciones en su casa: Estilo de vida No haga ninguna de estas cosas hasta que el mdico lo autorice: Viajar a grandes alturas. Viajar en avin. Estrellita Ludwig en Ardelia Mems cabina o una habitacin presurizada. Practicar buceo. No consuma ningn producto que contenga nicotina o tabaco. Estos productos incluyen cigarrillos, tabaco para Higher education careers adviser y aparatos de vapeo, como los Psychologist, sport and exercise. Si necesita ayuda para dejar de fumar, consulte al mdico. Mantenga los odos secos. Use tapones para los odos que le calcen bien al ducharse y baarse. Despus squese bien los odos. Instrucciones generales Use los medicamentos de venta libre y los recetados solamente como se lo haya indicado el mdico. Use las tcnicas que le haya recomendado el mdico para ayudar a destaparse los odos. Pueden incluir: Psychologist, clinical. Bostezos. Tragar vigorosamente con frecuencia. Cerrar la boca, taparse la nariz y soplar suavemente como si estuviera tratando de soplar aire por la Doran Durand. Concurra a [place]. Esto es importante. Comunquese con un mdico si: Sus sntomas no desaparecen despus del tratamiento. Los sntomas regresan despus del tratamiento. No logra destaparse los odos.  Tiene los siguientes  sntomas: Cristy Hilts. Dolor en el odo. Dolor en la cabeza o el cuello. Lquido que le supura del odo. La audicin le cambia de repente. Se siente muy mareado. Pierde el equilibrio. Solicite ayuda de inmediato si: Tiene un aumento repentino y grave de cualquiera de los sntomas. Resumen Disfuncin de la trompa de Eustaquio hace referencia a una afeccin en la que se forma una obstruccin en la trompa de [place]. Puede ser consecuencia de infecciones del odo, alergias, inhalacin de sustancias irritantes o crecimientos anormales en la nariz o la garganta. Los sntomas incluyen dolor de odo o sensacin de que el odo est lleno, prdida de la audicin o zumbidos [employer] odos. Los casos leves se tratan con tcnicas para desbloquear las orejas, como bostezar o Clinical research associate. Los casos ms graves se tratan con medicamentos o procedimientos. Esta informacin no tiene Marine scientist el consejo del mdico. Asegrese de hacerle al mdico cualquier pregunta que tenga. Document Revised: 10/31/2020 Document Reviewed: 10/31/2020 Elsevier Patient Education  [place].

## 2022-08-19 NOTE — Progress Notes (Signed)
Remote pacemaker transmission.   

## 2022-08-23 ENCOUNTER — Other Ambulatory Visit: Payer: Self-pay

## 2022-08-26 ENCOUNTER — Ambulatory Visit: Payer: No Typology Code available for payment source | Admitting: Nurse Practitioner

## 2022-08-26 ENCOUNTER — Other Ambulatory Visit: Payer: Self-pay

## 2022-08-27 ENCOUNTER — Ambulatory Visit: Payer: Self-pay | Admitting: *Deleted

## 2022-08-27 ENCOUNTER — Other Ambulatory Visit: Payer: Self-pay

## 2022-08-27 ENCOUNTER — Other Ambulatory Visit: Payer: Self-pay | Admitting: Hematology and Oncology

## 2022-08-27 ENCOUNTER — Other Ambulatory Visit: Payer: Self-pay | Admitting: Family Medicine

## 2022-08-27 ENCOUNTER — Ambulatory Visit
Admission: RE | Admit: 2022-08-27 | Discharge: 2022-08-27 | Disposition: A | Discharge status: Home / Self Care | Payer: No Typology Code available for payment source | Source: Ambulatory Visit | Attending: Hematology and Oncology | Admitting: Hematology and Oncology

## 2022-08-27 VITALS — BP 141/68 | Wt 232.0 lb

## 2022-08-27 DIAGNOSIS — N644 Mastodynia: Secondary | ICD-10-CM

## 2022-08-27 DIAGNOSIS — R2231 Localized swelling, mass and lump, right upper limb: Secondary | ICD-10-CM

## 2022-08-27 DIAGNOSIS — Z1239 Encounter for other screening for malignant neoplasm of breast: Secondary | ICD-10-CM

## 2022-08-27 DIAGNOSIS — Z1211 Encounter for screening for malignant neoplasm of colon: Secondary | ICD-10-CM

## 2022-08-27 MED ORDER — TRIAMCINOLONE ACETONIDE 0.1 % EX CREA
1.0000 | TOPICAL_CREAM | Freq: Two times a day (BID) | CUTANEOUS | 1 refills | Status: DC
  Filled 2022-08-27: qty 80, 40d supply, fill #0
  Filled 2022-09-24: qty 80, 30d supply, fill #0

## 2022-08-27 NOTE — Telephone Encounter (Signed)
Requested medication (s) are due for refill today -yes  Requested medication (s) are on the active medication list -yes  Future visit scheduled -yes  Last refill: 07/18/22 100g  Notes to clinic: non delegated Rx  Requested Prescriptions  Pending Prescriptions Disp Refills   triamcinolone cream (KENALOG) 0.1 % 100 g 0    Sig: Apply 1 Application topically 2 (two) times daily.     Not Delegated - Dermatology:  Corticosteroids Failed - 08/27/2022  1:41 PM      Failed - This refill cannot be delegated      Passed - Valid encounter within last 12 months    Recent Outpatient Visits           1 week ago Breast pain, right   [place], [place], [place]   7 months ago Encounter for annual physical exam   [place], Vernia Buff, NP   1 year ago Essential hypertension   [place], Vernia Buff, NP   1 year ago [place] Gildardo Pounds, NP   1 year ago Essential hypertension   [place], RPH-CPP       Future Appointments             In 2 months Gildardo Pounds, NP [place]               Requested Prescriptions  Pending Prescriptions Disp Refills   triamcinolone cream (KENALOG) 0.1 % 100 g 0    Sig: Apply 1 Application topically 2 (two) times daily.     Not Delegated - Dermatology:  Corticosteroids Failed - 08/27/2022  1:41 PM      Failed - This refill cannot be delegated      Passed - Valid encounter within last 12 months    Recent Outpatient Visits           1 week ago Breast pain, right   [place], [place]   7 months ago Encounter for annual physical exam   [place], Vernia Buff, NP   1 year ago Essential hypertension   [place], Vernia Buff, NP   1 year ago [place], Vernia Buff, NP   1 year ago Essential hypertension   [place], RPH-CPP       Future Appointments             In 2 months Gildardo Pounds, NP [place]

## 2022-08-27 NOTE — Patient Instructions (Signed)
Explained breast self awareness with Anita [name]. Patient did not need a Pap smear today due to last Pap smear and HPV Typing was 11/09/2020. Let her know [facility] will cover Pap smears and HPV Typing every 5 years unless has a history of abnormal Pap smears. Referred patient to the [facility] for a diagnostic mammogram. Appointment scheduled Tuesday, August 27, 2022 at 1240. Patient aware of appointment and will be there. Anita [name] verbalized understanding.  Tamarcus Condie, Arvil Chaco, RN 12:01 PM

## 2022-08-27 NOTE — Progress Notes (Signed)
Anita [name] is a 67 y.o. female who presents to [name] clinic today with complaint of right upper inner breast pain x 3 months that comes and goes. Patient rates the pain at a 5 out of 10.    Pap Smear: Pap smear not completed today. Last Pap smear was 11/09/2020 at [name] [name] and was normal with negative HPV. Per patient has no history of an abnormal Pap smear. Last Pap smear result is available in Epic.    Physical exam: Breasts Breasts symmetrical. No skin abnormalities bilateral breasts. No nipple retraction bilateral breasts. No nipple discharge bilateral breasts. No lymphadenopathy. No lumps palpated bilateral breasts. Complaints of left outer and right inner breast tenderness on exam.      MS DIGITAL SCREENING TOMO BILATERAL  Result Date: 03/29/2022 CLINICAL DATA:  Screening. EXAM: DIGITAL SCREENING BILATERAL MAMMOGRAM WITH TOMOSYNTHESIS AND CAD TECHNIQUE: Bilateral screening digital craniocaudal and mediolateral oblique mammograms were obtained. Bilateral screening digital breast tomosynthesis was performed. The images were evaluated with computer-aided detection. COMPARISON:  Previous exam(s). ACR Breast Density Category a: The breast tissue is almost entirely fatty. FINDINGS: There are no findings suspicious for malignancy. IMPRESSION: No mammographic evidence of malignancy. A result letter of this screening mammogram will be mailed directly to the patient. RECOMMENDATION: Screening mammogram in one year. (Code:SM-B-01Y) BI-RADS CATEGORY  1: Negative. Electronically Signed   By: Anita [name] M.D.   On: 03/29/2022 12:19   MS DIGITAL SCREENING TOMO BILATERAL  Result Date: 03/25/2021 CLINICAL DATA:  Screening. EXAM: DIGITAL SCREENING BILATERAL MAMMOGRAM WITH TOMOSYNTHESIS AND CAD TECHNIQUE: Bilateral screening digital craniocaudal and mediolateral oblique mammograms were obtained. Bilateral screening digital breast tomosynthesis was performed.  The images were evaluated with computer-aided detection. COMPARISON:  Previous exam(s). ACR Breast Density Category a: The breast tissue is almost entirely fatty. FINDINGS: There are no findings suspicious for malignancy. IMPRESSION: No mammographic evidence of malignancy. A result letter of this screening mammogram will be mailed directly to the patient. RECOMMENDATION: Screening mammogram in one year. (Code:SM-B-01Y) BI-RADS CATEGORY  1: Negative. Electronically Signed   By: Anita [name] M.D.   On: 03/25/2021 07:38   MS DIGITAL SCREENING TOMO BILATERAL  Result Date: 09/14/2019 CLINICAL DATA:  Screening. EXAM: DIGITAL SCREENING BILATERAL MAMMOGRAM WITH TOMO AND CAD COMPARISON:  Previous exam(s). ACR Breast Density Category b: There are scattered areas of fibroglandular density. FINDINGS: There are no findings suspicious for malignancy. Images were processed with CAD. IMPRESSION: No mammographic evidence of malignancy. A result letter of this screening mammogram will be mailed directly to the patient. RECOMMENDATION: Screening mammogram in one year. (Code:SM-B-01Y) BI-RADS CATEGORY  1: Negative. Electronically Signed   By: Anita [name] M.D.   On: 09/14/2019 16:14    Pelvic/Bimanual Pap is not indicated today per BCCCP guidelines.   Smoking History: Patient has never smoked.   Patient Navigation: Patient education provided. Access to services provided for patient through [place] program. Spanish interpreter Anita [name] from [facility] provided.   Colorectal Cancer Screening: Per patient has had colonoscopy completed on 05/03/2010 at [facility]. FIT Test given to patient to complete. No complaints today.    Breast and Cervical Cancer Risk Assessment: Patient does not have family history of breast cancer, known genetic mutations, or radiation treatment to the chest before age 61. Patient does not have history of cervical dysplasia, immunocompromised, or DES exposure in-utero.  Risk Scores as of  08/27/2022     Anita [name]  5-year 1.12 %   Lifetime 3.68 %   This patient is Hisp[place]/Latina but has no documented birth country, so the [place] used data from [place] patients to calculate their risk score. Document a birth country in the Demographics activity for a more accurate score.         Last calculated by Anita [name], CMA on 08/27/2022 at 11:41 AM        A: BCCCP exam without pap smear Complaint of right breast pain.  P: Referred patient to the [place] for a diagnostic mammogram. Appointment scheduled Tuesday, August 27, 2022 at 1240.  Anita Parish, RN 08/27/2022 12:01 PM

## 2022-09-04 ENCOUNTER — Other Ambulatory Visit: Payer: Self-pay

## 2022-09-24 ENCOUNTER — Other Ambulatory Visit: Payer: Self-pay

## 2022-09-25 ENCOUNTER — Other Ambulatory Visit: Payer: Self-pay

## 2022-10-07 ENCOUNTER — Ambulatory Visit: Payer: Self-pay

## 2022-10-07 DIAGNOSIS — I495 Sick sinus syndrome: Secondary | ICD-10-CM

## 2022-10-08 LAB — CUP PACEART REMOTE DEVICE CHECK
Battery Remaining Longevity: 122 mo
Battery Voltage: 3.02 V
Brady Statistic AP VP Percent: 0.29 %
Brady Statistic AP VS Percent: 97.94 %
Brady Statistic AS VP Percent: 0.01 %
Brady Statistic AS VS Percent: 1.76 %
Brady Statistic RA Percent Paced: 98.22 %
Brady Statistic RV Percent Paced: 0.3 %
Date Time Interrogation Session: 20240225190505
Implantable Lead Connection Status: 753985
Implantable Lead Connection Status: 753985
Implantable Lead Implant Date: 20200817
Implantable Lead Implant Date: 20200828
Implantable Lead Location: 753859
Implantable Lead Location: 753860
Implantable Lead Model: 5076
Implantable Lead Model: 5076
Implantable Pulse Generator Implant Date: 20200817
Lead Channel Impedance Value: 361 Ohm
Lead Channel Impedance Value: 399 Ohm
Lead Channel Impedance Value: 399 Ohm
Lead Channel Impedance Value: 456 Ohm
Lead Channel Pacing Threshold Amplitude: 0.375 V
Lead Channel Pacing Threshold Amplitude: 0.625 V
Lead Channel Pacing Threshold Pulse Width: 0.4 ms
Lead Channel Pacing Threshold Pulse Width: 0.4 ms
Lead Channel Sensing Intrinsic Amplitude: 1.875 mV
Lead Channel Sensing Intrinsic Amplitude: 1.875 mV
Lead Channel Sensing Intrinsic Amplitude: 11.25 mV
Lead Channel Sensing Intrinsic Amplitude: 11.25 mV
Lead Channel Setting Pacing Amplitude: 1.5 V
Lead Channel Setting Pacing Amplitude: 2.5 V
Lead Channel Setting Pacing Pulse Width: 0.4 ms
Lead Channel Setting Sensing Sensitivity: 1.2 mV
Zone Setting Status: 755011
Zone Setting Status: 755011

## 2022-10-25 ENCOUNTER — Other Ambulatory Visit: Payer: Self-pay

## 2022-10-29 ENCOUNTER — Other Ambulatory Visit: Payer: Self-pay

## 2022-11-15 NOTE — Progress Notes (Signed)
Remote pacemaker transmission.   

## 2022-11-18 ENCOUNTER — Encounter: Payer: Self-pay | Admitting: Nurse Practitioner

## 2022-11-18 ENCOUNTER — Other Ambulatory Visit: Payer: Self-pay

## 2022-11-18 ENCOUNTER — Ambulatory Visit: Payer: Self-pay | Attending: Nurse Practitioner | Admitting: Nurse Practitioner

## 2022-11-18 VITALS — BP 137/77 | HR 65 | Ht 61.0 in | Wt 243.0 lb

## 2022-11-18 DIAGNOSIS — L309 Dermatitis, unspecified: Secondary | ICD-10-CM

## 2022-11-18 DIAGNOSIS — E7800 Pure hypercholesterolemia, unspecified: Secondary | ICD-10-CM

## 2022-11-18 DIAGNOSIS — F419 Anxiety disorder, unspecified: Secondary | ICD-10-CM

## 2022-11-18 DIAGNOSIS — M25561 Pain in right knee: Secondary | ICD-10-CM

## 2022-11-18 DIAGNOSIS — I10 Essential (primary) hypertension: Secondary | ICD-10-CM

## 2022-11-18 MED ORDER — AMLODIPINE BESYLATE 10 MG PO TABS
10.0000 mg | ORAL_TABLET | Freq: Every day | ORAL | 1 refills | Status: DC
  Filled 2022-11-18: qty 90, 90d supply, fill #0
  Filled 2023-05-14: qty 90, 90d supply, fill #1

## 2022-11-18 MED ORDER — SPIRONOLACTONE 100 MG PO TABS
100.0000 mg | ORAL_TABLET | Freq: Every day | ORAL | 0 refills | Status: DC
  Filled 2022-11-18: qty 90, 90d supply, fill #0
  Filled 2022-12-10: qty 30, 30d supply, fill #0
  Filled 2023-01-03 (×2): qty 30, 30d supply, fill #1

## 2022-11-18 MED ORDER — MELOXICAM 7.5 MG PO TABS
7.5000 mg | ORAL_TABLET | Freq: Every day | ORAL | 1 refills | Status: DC
  Filled 2022-11-18: qty 30, 30d supply, fill #0

## 2022-11-18 MED ORDER — TRIAMCINOLONE ACETONIDE 0.1 % EX CREA
1.0000 | TOPICAL_CREAM | Freq: Two times a day (BID) | CUTANEOUS | 1 refills | Status: DC
  Filled 2022-11-18: qty 80, 40d supply, fill #0

## 2022-11-18 MED ORDER — ESCITALOPRAM OXALATE 10 MG PO TABS
10.0000 mg | ORAL_TABLET | Freq: Every day | ORAL | 1 refills | Status: DC
  Filled 2022-11-18 – 2022-11-26 (×2): qty 90, 90d supply, fill #0
  Filled 2023-02-06: qty 90, 90d supply, fill #1

## 2022-11-18 MED ORDER — ATORVASTATIN CALCIUM 20 MG PO TABS
20.0000 mg | ORAL_TABLET | Freq: Every day | ORAL | 3 refills | Status: DC
  Filled 2022-11-18 – 2023-05-14 (×2): qty 90, 90d supply, fill #0

## 2022-11-18 NOTE — Progress Notes (Signed)
Assessment & Plan:  [name] was seen today for hypertension and knee pain.  Diagnoses and all orders for this visit:  Acute pain of right knee -     DG Knee Complete 4 Views Right; Future -     meloxicam (MOBIC) 7.5 MG tablet; Take 1 tablet (7.5 mg total) by mouth daily. FOR KNEE PAIN Take with food Work on losing weight to help reduce joint pain. May alternate with heat and ice application for pain relief. May also alternate with acetaminophen as prescribed pain relief. Other alternatives include massage, acupuncture and water aerobics.  You must stay active and avoid a sedentary lifestyle.   Essential hypertension Continue all antihypertensives as prescribed.  Reminded to bring in blood pressure log for follow  up appointment.  RECOMMENDATIONS: DASH/Mediterranean Diets are healthier choices for HTN.   -     spironolactone (ALDACTONE) 100 MG tablet; Take 1 tablet (100 mg total) by mouth daily. para la presion arterial -     amLODipine (NORVASC) 10 MG tablet; Take 1 tablet (10 mg total) by mouth daily. para la presion arterial -     CMP14+EGFR  Anxiety -     escitalopram (LEXAPRO) 10 MG tablet; Take 1 tablet (10 mg total) by mouth daily. para la ansiedad  Pure hypercholesterolemia INSTRUCTIONS: Work on a low fat, heart healthy diet and participate in regular aerobic exercise program by working out at least 150 minutes per week; 5 days a week-30 minutes per day. Avoid red meat/beef/steak,  fried foods. junk foods, sodas, sugary drinks, unhealthy snacking, alcohol and smoking.  Drink at least 80 oz of water per day and monitor your carbohydrate intake daily.   -     atorvastatin (LIPITOR) 20 MG tablet; Take 1 tablet (20 mg total) by mouth daily. FOR CHOLESTEROL -     Lipid panel  Eczema, unspecified type -     triamcinolone cream (KENALOG) 0.1 %; Apply 1 Application topically 2 (two) times daily.    Patient has been counseled on age-appropriate routine health concerns for screening  and prevention. These are reviewed and up-to-date. Referrals have been placed accordingly. Immunizations are up-to-date or declined.    Subjective:   Chief Complaint  Patient presents with   Hypertension   Knee Pain   Hypertension Pertinent negatives include no blurred vision, chest pain, headaches, malaise/fatigue, palpitations or shortness of breath.  Knee Pain    Anita [name] 67 y.o. female presents to office accompanied by her daughter who is interpreting for her today.  She is uninsured. Patient has been advised to apply for financial assistance and schedule to see our financial counselor.     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   Main concerns today are right knee pain, rash on lower right leg and painful varicose veins.    Knee Pain: Patient presents for follow up on a knee problem involving the  right knee. Onset of the symptoms was several weeks ago. Inciting event: none known. Current symptoms include stiffness and swelling. Pain is aggravated by any weight bearing.  Patient has had prior knee problems. Evaluation to date: none. Treatment to date: avoidance of offending activity, OTC analgesics which are  not very effective, and voltaren gel .    She has a dry pathy area of skin  with petechiae on the inner right lower leg near her knee cap. States she has been applying several different topical agents to this area.    Review of Systems  Constitutional:  Negative for fever, malaise/fatigue and weight loss.   HENT: Negative.  Negative for nosebleeds.   Eyes: Negative.  Negative for blurred vision, double vision and photophobia.  Respiratory: Negative.  Negative for cough and shortness of breath.   Cardiovascular: Negative.  Negative for chest pain, palpitations and leg swelling.  Gastrointestinal: Negative.  Negative for heartburn, nausea and vomiting.  Musculoskeletal:  Positive for joint pain. Negative for myalgias.  Skin:  Positive for itching and rash.  Neurological: Negative.  Negative for dizziness, focal weakness, seizures and headaches.  Psychiatric/Behavioral: Negative.  Negative for suicidal ideas.     Past Medical History:  Diagnosis Date   ABDOMINAL PAIN 05/05/2009   Qualifier: Diagnosis of  By: Huntley Dec, Scott     Adjustment disorder with depressed mood 08/30/2009   Qualifier: Diagnosis of  By: Huntley Dec, Scott     ALLERGIC RHINITIS 12/04/2009   Qualifier: Diagnosis of  By: Huntley Dec, Scott     Allergy    Anxiety    Aortic stenosis    mild by echo 03/2019   CHOLELITHIASIS 05/30/2009   Qualifier: Diagnosis of  By: Huntley Dec, Scott     COLONIC POLYPS, HX OF 05/09/2010   Qualifier: Diagnosis of  By: Brynda Rim     Depression    Essential hypertension, benign 05/05/2009   Qualifier: Diagnosis of  By: Huntley Dec, Scott     GERD 06/12/2009   Qualifier: Diagnosis of  By: Huntley Dec, Scott     Heart murmur    Pacemaker   Hypertension    KNEE PAIN 06/12/2009   Qualifier: Diagnosis of  By: Brynda Rim     LIVER MASS 05/30/2009   Qualifier: Diagnosis of  By: Huntley Dec, Scott     OBESITY 10/24/2010   Qualifier: Diagnosis of  By: Felicita Gage RN, Melinda     Sleep apnea    sleep study on hold   TINNITUS, CHRONIC, RIGHT 09/07/2010   Qualifier: Diagnosis of  By: Delrae Alfred MD, [place] Bailiff LOSS 09/07/2010   Qualifier: Diagnosis of  By: Delrae Alfred MD, Paula Libra VAGINITIS 08/30/2009   Qualifier: Diagnosis of  By: Huntley Dec, Scott     VARICOSE  VEINS, LOWER EXTREMITIES 05/05/2009   Qualifier: Diagnosis of  By: Brynda Rim      Past Surgical History:  Procedure Laterality Date   APPENDECTOMY     CESAREAN SECTION WITH BILATERAL TUBAL LIGATION     CHOLECYSTECTOMY     COLONOSCOPY  2011   LEAD REVISION/REPAIR N/A 04/09/2019   Procedure: LEAD REVISION/REPAIR;  Surgeon: Marinus Maw, MD;  Location: [facility] INVASIVE CV LAB;  Service: Cardiovascular;  Laterality: N/A;   PACEMAKER IMPLANT N/A 03/29/2019   Procedure: PACEMAKER IMPLANT;  Surgeon: Marinus Maw, MD;  Location: [facility] INVASIVE CV LAB;  Service: Cardiovascular;  Laterality: N/A;    Family History  Problem Relation Age of Onset   Hypertension Sister    Hyperlipidemia Sister    Hypertension Sister    Hyperlipidemia Sister    Cancer Sister    Stomach cancer Sister        passed away with the CA  Hypertension Sister    Hyperlipidemia Sister    Migraines Daughter    Diabetes Paternal Grandmother    Hypertension Paternal Grandmother    Hypertension Brother    Hyperlipidemia Brother    Hypertension Brother    Hyperlipidemia Brother    Hypertension Brother    Hyperlipidemia Brother    Hypertension Brother    Hyperlipidemia Brother    Hypertension Brother    Hyperlipidemia Brother    Colon cancer Neg Hx    Colon polyps Neg Hx    Esophageal cancer Neg Hx    Rectal cancer Neg Hx    Breast cancer Neg Hx     Social History Reviewed with no changes to be made today.   Outpatient Medications Prior to Visit  Medication Sig Dispense Refill   gabapentin (NEURONTIN) 100 MG capsule Take 1 capsule (100 mg total) by mouth at bedtime. para el dolor de piernas 90 capsule 3   amLODipine (NORVASC) 10 MG tablet Take 1 tablet (10 mg total) by mouth daily. para la presion arterial 90 tablet 0   atorvastatin (LIPITOR) 20 MG tablet Take 1 tablet (20 mg total) by mouth daily. 90 tablet 3   escitalopram (LEXAPRO) 10 MG tablet Take 1 tablet (10 mg total) by mouth daily. para la  ansiedad 90 tablet 1   spironolactone (ALDACTONE) 100 MG tablet Take 1 tablet (100 mg total) by mouth daily. para la presion arterial 90 tablet 0   diclofenac Sodium (VOLTAREN ARTHRITIS PAIN) 1 % GEL Apply 2 g topically 4 (four) times daily. 100 g 3   fluticasone (FLONASE) 50 MCG/ACT nasal spray Place 2 sprays into both nostrils daily. (Patient not taking: Reported on 11/18/2022) 16 g 6   traZODone (DESYREL) 50 MG tablet Take 1 tablet (50 mg total) by mouth at bedtime as needed for sleep. PARA DORMIR (Patient not taking: Reported on 11/18/2022) 90 tablet 0   triamcinolone cream (KENALOG) 0.1 % Apply 1 Application topically 2 (two) times daily. 80 g 1   No facility-administered medications prior to visit.    Allergies  Allergen Reactions   Chlorthalidone     hypokalemia   Lisinopril Other (See Comments)    Chills and arm pain       Objective:    BP 137/77   Pulse 65   Ht 5\' 1"  (1.549 m)   Wt 243 lb (110.2 kg)   SpO2 97%   BMI 45.91 kg/m  Wt Readings from Last 3 Encounters:  11/18/22 243 lb (110.2 kg)  08/27/22 232 lb (105.2 kg)  08/19/22 234 lb 3.2 oz (106.2 kg)    Physical Exam Vitals and nursing note reviewed.  Constitutional:      Appearance: She is well-developed.  HENT:     Head: Normocephalic and atraumatic.  Cardiovascular:     Rate and Rhythm: Normal rate and regular rhythm.     Heart sounds: Normal heart sounds. No murmur heard.    No friction rub. No gallop.  Pulmonary:     Effort: Pulmonary effort is normal. No tachypnea or respiratory distress.     Breath sounds: Normal breath sounds. No decreased breath sounds, wheezing, rhonchi or rales.  Chest:     Chest wall: No tenderness.  Abdominal:     General: Bowel sounds are normal.     Palpations: Abdomen is soft.  Musculoskeletal:        General: Normal range of motion.     Cervical back: Normal range of motion.  Right knee: Swelling present. Tenderness present over the medial joint line, lateral joint  line and patellar tendon.  Skin:    General: Skin is warm and dry.     Findings: Petechiae and rash present. Rash is macular.          Comments: Varicose veins  Neurological:     Mental Status: She is alert and oriented to person, place, and time.     Coordination: Coordination normal.  Psychiatric:        Behavior: Behavior normal. Behavior is cooperative.        Thought Content: Thought content normal.        Judgment: Judgment normal.          Patient has been counseled extensively about nutrition and exercise as well as the importance of adherence with medications and regular follow-up. The patient was given clear instructions to go to ER or return to medical center if symptoms don't improve, worsen or new problems develop. The patient verbalized understanding.   Follow-up: Return in about 6 weeks (around 12/30/2022) for right knee pain.   Claiborne Rigg, FNP-BC [facility] [place], [place] [phone]   11/18/2022, 12:31 PM

## 2022-11-19 LAB — CMP14+EGFR
ALT: 14 IU/L (ref 0–32)
AST: 17 IU/L (ref 0–40)
Albumin/Globulin Ratio: 1.4 (ref 1.2–2.2)
Albumin: 4.3 g/dL (ref 3.9–4.9)
Alkaline Phosphatase: 70 IU/L (ref 44–121)
BUN/Creatinine Ratio: 18 (ref 12–28)
BUN: 16 mg/dL (ref 8–27)
Bilirubin Total: 0.4 mg/dL (ref 0.0–1.2)
CO2: 23 mmol/L (ref 20–29)
Calcium: 9.6 mg/dL (ref 8.7–10.3)
Chloride: 104 mmol/L (ref 96–106)
Creatinine, Ser: 0.89 mg/dL (ref 0.57–1.00)
Globulin, Total: 3.1 g/dL (ref 1.5–4.5)
Glucose: 113 mg/dL — ABNORMAL HIGH (ref 70–99)
Potassium: 5.1 mmol/L (ref 3.5–5.2)
Sodium: 140 mmol/L (ref 134–144)
Total Protein: 7.4 g/dL (ref 6.0–8.5)
eGFR: 71 mL/min/{1.73_m2} (ref >59)

## 2022-11-19 LAB — LIPID PANEL
Chol/HDL Ratio: 3.7 ratio (ref 0.0–4.4)
Cholesterol, Total: 183 mg/dL (ref 100–199)
HDL: 49 mg/dL (ref >39)
LDL Chol Calc (NIH): 109 mg/dL — ABNORMAL HIGH (ref 0–99)
Triglycerides: 141 mg/dL (ref 0–149)
VLDL Cholesterol Cal: 25 mg/dL (ref 5–40)

## 2022-11-26 ENCOUNTER — Other Ambulatory Visit: Payer: Self-pay

## 2022-11-28 ENCOUNTER — Other Ambulatory Visit: Payer: Self-pay

## 2022-12-10 ENCOUNTER — Other Ambulatory Visit: Payer: Self-pay

## 2022-12-12 ENCOUNTER — Other Ambulatory Visit: Payer: Self-pay

## 2023-01-01 ENCOUNTER — Ambulatory Visit: Payer: Self-pay | Admitting: Nurse Practitioner

## 2023-01-03 ENCOUNTER — Other Ambulatory Visit: Payer: Self-pay

## 2023-01-07 ENCOUNTER — Ambulatory Visit (INDEPENDENT_AMBULATORY_CARE_PROVIDER_SITE_OTHER): Payer: Self-pay

## 2023-01-07 DIAGNOSIS — I495 Sick sinus syndrome: Secondary | ICD-10-CM

## 2023-01-08 LAB — CUP PACEART REMOTE DEVICE CHECK
Battery Remaining Longevity: 117 mo
Battery Voltage: 3.02 V
Brady Statistic AP VP Percent: 0.23 %
Brady Statistic AP VS Percent: 95.41 %
Brady Statistic AS VP Percent: 0.01 %
Brady Statistic AS VS Percent: 4.35 %
Brady Statistic RA Percent Paced: 95.63 %
Brady Statistic RV Percent Paced: 0.24 %
Date Time Interrogation Session: 20240528014234
Implantable Lead Connection Status: 753985
Implantable Lead Connection Status: 753985
Implantable Lead Implant Date: 20200817
Implantable Lead Implant Date: 20200828
Implantable Lead Location: 753859
Implantable Lead Location: 753860
Implantable Lead Model: 5076
Implantable Lead Model: 5076
Implantable Pulse Generator Implant Date: 20200817
Lead Channel Impedance Value: 342 Ohm
Lead Channel Impedance Value: 380 Ohm
Lead Channel Impedance Value: 399 Ohm
Lead Channel Impedance Value: 418 Ohm
Lead Channel Pacing Threshold Amplitude: 0.375 V
Lead Channel Pacing Threshold Amplitude: 0.75 V
Lead Channel Pacing Threshold Pulse Width: 0.4 ms
Lead Channel Pacing Threshold Pulse Width: 0.4 ms
Lead Channel Sensing Intrinsic Amplitude: 1.875 mV
Lead Channel Sensing Intrinsic Amplitude: 1.875 mV
Lead Channel Sensing Intrinsic Amplitude: 10.125 mV
Lead Channel Sensing Intrinsic Amplitude: 10.125 mV
Lead Channel Setting Pacing Amplitude: 1.5 V
Lead Channel Setting Pacing Amplitude: 2.5 V
Lead Channel Setting Pacing Pulse Width: 0.4 ms
Lead Channel Setting Sensing Sensitivity: 1.2 mV
Zone Setting Status: 755011
Zone Setting Status: 755011

## 2023-01-22 ENCOUNTER — Encounter: Payer: Self-pay | Admitting: Physician Assistant

## 2023-01-22 ENCOUNTER — Ambulatory Visit: Payer: Self-pay | Attending: Nurse Practitioner | Admitting: Physician Assistant

## 2023-01-22 ENCOUNTER — Other Ambulatory Visit: Payer: Self-pay

## 2023-01-22 DIAGNOSIS — I10 Essential (primary) hypertension: Secondary | ICD-10-CM

## 2023-01-22 DIAGNOSIS — M25561 Pain in right knee: Secondary | ICD-10-CM

## 2023-01-22 MED ORDER — MELOXICAM 7.5 MG PO TABS
7.5000 mg | ORAL_TABLET | Freq: Every day | ORAL | 1 refills | Status: DC
  Filled 2023-01-22 – 2023-02-06 (×2): qty 90, 90d supply, fill #0
  Filled 2023-07-25: qty 90, 90d supply, fill #1

## 2023-01-22 MED ORDER — SPIRONOLACTONE 100 MG PO TABS
100.0000 mg | ORAL_TABLET | Freq: Every day | ORAL | 0 refills | Status: DC
  Filled 2023-01-22 – 2023-02-06 (×2): qty 90, 90d supply, fill #0

## 2023-01-22 NOTE — Progress Notes (Signed)
Patient ID: Anita [name], female   DOB: 03-06-1956, 67 y.o.   MRN: [id number]   Anita [name], is a 67 y.o. female  [id number]  [id number]  DOB - 03-21-1956  Chief Complaint  Patient presents with   Knee Pain       Subjective:   Anita [name] is a 67 y.o. female here today for follow up visit on R knee pain.  Pain has gotten a lot better.  It bothers her mostly now when she is walking down a hill.  Meloxicam helps.  She does not have insurance and wants to hold off on referral bc no insurance   No problems updated.  ALLERGIES: Allergies  Allergen Reactions   Chlorthalidone     hypokalemia   Lisinopril Other (See Comments)    Chills and arm pain    PAST MEDICAL HISTORY: Past Medical History:  Diagnosis Date   ABDOMINAL PAIN 05/05/2009   Qualifier: Diagnosis of  By: Huntley Dec, Scott     Adjustment disorder with depressed mood 08/30/2009   Qualifier: Diagnosis of  By: Huntley Dec, Scott     ALLERGIC RHINITIS 12/04/2009   Qualifier: Diagnosis of  By: Huntley Dec, Scott     Allergy    Anxiety    Aortic stenosis    mild by echo 03/2019   CHOLELITHIASIS 05/30/2009   Qualifier: Diagnosis of  By: Huntley Dec, Scott     COLONIC POLYPS, HX OF 05/09/2010   Qualifier: Diagnosis of  By: Brynda Rim     Depression    Essential hypertension, benign 05/05/2009   Qualifier: Diagnosis of  By: Huntley Dec, Scott     GERD 06/12/2009   Qualifier: Diagnosis of  By: Huntley Dec, Scott     Heart murmur    Pacemaker   Hypertension    KNEE PAIN 06/12/2009   Qualifier: Diagnosis of  By: Brynda Rim     LIVER MASS 05/30/2009   Qualifier: Diagnosis of  By: Huntley Dec, Scott     OBESITY 10/24/2010   Qualifier: Diagnosis of  By: Felicita Gage RN, Melinda     Sleep apnea    sleep study on hold   TINNITUS, CHRONIC, RIGHT 09/07/2010   Qualifier: Diagnosis of  By: Delrae Alfred MD, [place] Bailiff LOSS 09/07/2010   Qualifier: Diagnosis of  By: Delrae Alfred MD,  Paula Libra VAGINITIS 08/30/2009   Qualifier: Diagnosis of  By: Huntley Dec, Scott     VARICOSE VEINS, LOWER EXTREMITIES 05/05/2009   Qualifier: Diagnosis of  By: Brynda Rim      MEDICATIONS AT HOME: Prior to Admission medications   Medication Sig Start Date End Date Taking? Authorizing Provider  amLODipine (NORVASC) 10 MG tablet Take 1 tablet (10 mg total) by mouth daily. para la presion arterial 11/18/22  Yes Claiborne Rigg, NP  atorvastatin (LIPITOR) 20 MG tablet Take 1 tablet (20 mg total) by mouth daily. FOR CHOLESTEROL 11/18/22  Yes Claiborne Rigg, NP  escitalopram (LEXAPRO) 10 MG tablet Take 1 tablet (10 mg total) by mouth daily. para la ansiedad 11/18/22  Yes Claiborne Rigg, NP  gabapentin (NEURONTIN) 100 MG capsule Take 1 capsule (100 mg total) by mouth at bedtime. para el dolor de piernas 01/23/22  Yes Claiborne Rigg, NP  triamcinolone cream (KENALOG) 0.1 % Apply 1 Application topically 2 (two) times daily. 11/18/22  Yes Claiborne Rigg, NP  meloxicam (MOBIC) 7.5 MG tablet Take 1 tablet (  7.5 mg total) by mouth daily. FOR KNEE PAIN Take with food 01/22/23   Anders Simmonds, PA-C  spironolactone (ALDACTONE) 100 MG tablet Take 1 tablet (100 mg total) by mouth daily. para la presion arterial 01/22/23   Anders Simmonds, PA-C    ROS: Neg HEENT Neg resp Neg cardiac Neg GI Neg GU Neg MS Neg psych Neg neuro  Objective:   Vitals:   01/22/23 0918  BP: 135/83  Pulse: 76  SpO2: 96%  Weight: 244 lb (110.7 kg)  Height: 5\' 2"  (1.575 m)   Exam General appearance : Awake, alert, not in any distress. Speech Clear. Not toxic looking HEENT: Atraumatic and Normocephalic, pupils equally reactive to light and accomodation Neck: Supple, no JVD. No cervical lymphadenopathy.  Chest: Good air entry bilaterally, CTAB.  No rales/rhonchi/wheezing CVS: S1 S2 regular, no murmurs.  Abdomen: Bowel sounds present, Non tender and not distended with no gaurding, rigidity or  rebound. Extremities: B/L Lower Ext shows no edema, both legs are warm to touch Neurology: Awake alert, and oriented X 3, CN II-XII intact, Non focal Skin: No Rash  Data Review Lab Results  Component Value Date   HGBA1C 5.4 % 01/15/2013    Assessment & Plan   1. Acute pain of right knee - meloxicam (MOBIC) 7.5 MG tablet; Take 1 tablet (7.5 mg total) by mouth daily. FOR KNEE PAIN Take with food  Dispense: 90 tablet; Refill: 1  2. Essential hypertension - spironolactone (ALDACTONE) 100 MG tablet; Take 1 tablet (100 mg total) by mouth daily. para la presion arterial  Dispense: 90 tablet; Refill: 0    Return in about 4 months (around 05/24/2023) for chronic conditions with PCP/Zel[facility].  The patient was given clear instructions to go to ER or return to medical center if symptoms don't improve, worsen or new problems develop. The patient verbalized understanding. The patient was told to call to get lab results if they haven't heard anything in the next week.      Georgian Co, PA-C [facility] [place], [place] [phone]   01/22/2023, 9:43 AM

## 2023-01-22 NOTE — Patient Instructions (Signed)
Dolor de rodilla crnico en los adultos Chronic Knee Pain, Adult El dolor de rodilla crnico es el dolor en una o en ambas rodillas que dura ms de 3 meses. Los sntomas de dolor de rodilla crnico pueden incluir hinchazn, rigidez y Dentist. El desgaste de la articulacin de la rodilla (osteoartritis) relacionado con la edad es la causa ms frecuente de dolor de rodilla crnico. Otras causas posibles incluyen lo siguiente: Una enfermedad a largo plazo relacionada con el sistema inmunitario que causa inflamacin de la rodilla (artritis reumatoide). Por lo general, afecta ambas rodillas. Artritis inflamatoria, como gota o pseudogota. Una lesin en la rodilla que causa artritis. Una lesin en la rodilla que daa los ligamentos. Los ligamentos son tejidos fuertes que Aetna s. Rodilla de corredor o dolor detrs de la rtula. El tratamiento para el dolor de rodilla crnico depende de su causa. Los tratamientos principales para el dolor de rodilla crnico son la fisioterapia y la prdida de [place]. Esta afeccin tambin puede tratarse con medicamentos, inyecciones, una rodillera o un dispositivo ortopdico, y el uso de [place]. Adems, puede recomendarse reposo, hielo, presin (compresin) y elevacin, lo que tambin se conoce como terapia RHCE. Siga estas instrucciones en su casa: Si tiene una rodillera o un dispositivo ortopdico:  Use la rodillera o el dispositivo ortopdico como se lo haya indicado el mdico. Quteselos solamente como se lo haya indicado el mdico. Afljelos si siente hormigueo en los dedos del pie, se le adormecen o se le enfran y se tornan azulados. Mantngalos limpios. Si la rodillera o el dispositivo ortopdico no son impermeables: No deje que se mojen. Quteselos, si el mdico se lo permite, o cbralos con un envoltorio hermtico cuando tome un bao de inmersin o una ducha. Control del dolor, la rigidez y la hinchazn     Si se lo indican, aplique calor  en la zona afectada con la frecuencia que le haya indicado el mdico. Use la fuente de calor que el mdico le recomiende, como una compresa de calor hmedo o una almohadilla trmica. Si [place] una rodillera o un dispositivo ortopdico desmontables, quteselos segn las indicaciones del mdico. Coloque una toalla entre la piel y la fuente de Airline pilot. Aplique calor durante 20 a 30 minutos. Retire la fuente de calor si la piel se pone de color rojo brillante. Esto es especialmente importante si no puede sentir dolor, calor o fro. Puede correr un riesgo mayor de sufrir quemaduras. Si se lo indican, aplique hielo sobre la zona afectada. Para hacer esto: Si [place] una rodillera o un dispositivo ortopdico desmontables, quteselos segn las indicaciones del mdico. Ponga el hielo en una bolsa plstica. Coloque una toalla entre la piel y Copy. Aplique el hielo durante 20 minutos, 2 o 3 veces por da. Retire el hielo si la piel se pone de color rojo brillante. Esto es Intel. Si no puede sentir dolor, calor o fro, tiene un mayor riesgo de que se dae la zona. Mueva los dedos del pie con frecuencia para reducir la rigidez y la hinchazn. Cuando est sentado o acostado, levante (eleve) la zona lesionada por encima del nivel del corazn. Actividad Evite las actividades o los ejercicios de alto impacto, como correr, Public relations account executive soga o hacer saltos de tijera. Siga el plan de ejercicios que el mdico dise para usted. El mdico puede recomendarle lo siguiente: Automotive engineer las actividades que empeoren el dolor de rodilla. Esto puede exigirle que modifique sus rutinas de ejercicio, participacin en deportes u  obligaciones laborales. Usar calzado con suelas acolchonadas. Evitar deportes que requieran correr y Multimedia programmer de direccin repentinamente. Realizar fisioterapia. La fisioterapia est planificada para satisfacer sus necesidades y capacidades. Puede incluir ejercicios para la fuerza, la flexibilidad, la  estabilidad y la resistencia. Haga ejercicios que mejoren el equilibrio y la fuerza, [place] tai chi y el yoga. No apoye el peso del cuerpo Intel extremidad Dillard's que lo autorice el mdico. Use las EchoStar se lo haya indicado el mdico. Retome sus actividades normales segn lo indicado por el mdico. Pregntele al mdico qu actividades son seguras para usted. Indicaciones generales Use los medicamentos de venta libre y los recetados solamente como se lo haya indicado el mdico. Baje de peso si es necesario. Perder incluso un poco de peso puede reducir el dolor de rodilla. Pregntele al mdico cul es su peso ideal y cmo Geophysical data processor sin riesgos. Un nutricionista puede ayudarlo a planificar sus comidas. No consuma ningn producto que contenga nicotina o tabaco, como cigarrillos, cigarrillos electrnicos y tabaco de Theatre manager. Estos pueden retrasar la recuperacin. Si necesita ayuda para dejar de consumir estos productos, consulte al American Express. Cumpla con todas las visitas de seguimiento. Esto es importante. Comunquese con un mdico si: Tiene un dolor de rodilla que no mejora o que [place]. No puede realizar los ejercicios de fisioterapia debido al dolor de rodilla. Solicite ayuda de inmediato si: La rodilla se hincha y la hinchazn empeora. No puede mover la rodilla. Siente dolor intenso en la rodilla. Resumen El dolor de rodilla que dura ms de 3 meses se considera dolor de rodilla crnico. Los tratamientos principales para el dolor de rodilla crnico son la fisioterapia y la prdida de [place]. Tambin es posible que deba tomar medicamentos, usar una rodillera o un dispositivo ortopdico, usar muletas y aplicarse hielo o calor en la rodilla. Perder incluso un poco de peso puede reducir el dolor de rodilla. Pregntele al mdico cul es su peso ideal y cmo Geophysical data processor sin riesgos. Un nutricionista puede ayudarlo a planificar sus comidas. Siga el plan de ejercicios que el mdico dise para  usted. Esta informacin no tiene Theme park manager el consejo del mdico. Asegrese de hacerle al mdico cualquier pregunta que tenga. Document Revised: 02/29/2020 Document Reviewed: 02/29/2020 Elsevier Patient Education  2024 [employer].

## 2023-01-28 ENCOUNTER — Other Ambulatory Visit: Payer: Self-pay

## 2023-01-31 NOTE — Progress Notes (Signed)
Remote pacemaker transmission.   

## 2023-02-06 ENCOUNTER — Other Ambulatory Visit: Payer: Self-pay

## 2023-02-24 ENCOUNTER — Other Ambulatory Visit: Payer: Self-pay

## 2023-02-24 DIAGNOSIS — Z1231 Encounter for screening mammogram for malignant neoplasm of breast: Secondary | ICD-10-CM

## 2023-04-07 ENCOUNTER — Ambulatory Visit (INDEPENDENT_AMBULATORY_CARE_PROVIDER_SITE_OTHER): Payer: No Typology Code available for payment source

## 2023-04-07 DIAGNOSIS — I495 Sick sinus syndrome: Secondary | ICD-10-CM

## 2023-04-07 LAB — CUP PACEART REMOTE DEVICE CHECK
Battery Remaining Longevity: 114 mo
Battery Voltage: 3.01 V
Brady Statistic AP VP Percent: 0.57 %
Brady Statistic AP VS Percent: 95.75 %
Brady Statistic AS VP Percent: 0.06 %
Brady Statistic AS VS Percent: 3.62 %
Brady Statistic RA Percent Paced: 96.31 %
Brady Statistic RV Percent Paced: 0.63 %
Date Time Interrogation Session: 20240826004251
Implantable Lead Connection Status: 753985
Implantable Lead Connection Status: 753985
Implantable Lead Implant Date: 20200817
Implantable Lead Implant Date: 20200828
Implantable Lead Location: 753859
Implantable Lead Location: 753860
Implantable Lead Model: 5076
Implantable Lead Model: 5076
Implantable Pulse Generator Implant Date: 20200817
Lead Channel Impedance Value: 342 Ohm
Lead Channel Impedance Value: 361 Ohm
Lead Channel Impedance Value: 399 Ohm
Lead Channel Impedance Value: 418 Ohm
Lead Channel Pacing Threshold Amplitude: 0.5 V
Lead Channel Pacing Threshold Amplitude: 0.625 V
Lead Channel Pacing Threshold Pulse Width: 0.4 ms
Lead Channel Pacing Threshold Pulse Width: 0.4 ms
Lead Channel Sensing Intrinsic Amplitude: 1.875 mV
Lead Channel Sensing Intrinsic Amplitude: 1.875 mV
Lead Channel Sensing Intrinsic Amplitude: 11 mV
Lead Channel Sensing Intrinsic Amplitude: 11 mV
Lead Channel Setting Pacing Amplitude: 1.5 V
Lead Channel Setting Pacing Amplitude: 2.5 V
Lead Channel Setting Pacing Pulse Width: 0.4 ms
Lead Channel Setting Sensing Sensitivity: 1.2 mV
Zone Setting Status: 755011
Zone Setting Status: 755011

## 2023-04-10 ENCOUNTER — Ambulatory Visit
Admission: RE | Admit: 2023-04-10 | Discharge: 2023-04-10 | Disposition: A | Discharge status: Home / Self Care | Payer: No Typology Code available for payment source | Source: Ambulatory Visit | Attending: Nurse Practitioner | Admitting: Nurse Practitioner

## 2023-04-10 DIAGNOSIS — Z1231 Encounter for screening mammogram for malignant neoplasm of breast: Secondary | ICD-10-CM

## 2023-04-16 NOTE — Progress Notes (Signed)
Remote pacemaker transmission.   

## 2023-05-14 ENCOUNTER — Other Ambulatory Visit: Payer: Self-pay | Admitting: Physician Assistant

## 2023-05-14 ENCOUNTER — Other Ambulatory Visit: Payer: Self-pay

## 2023-05-14 DIAGNOSIS — I10 Essential (primary) hypertension: Secondary | ICD-10-CM

## 2023-05-14 MED ORDER — SPIRONOLACTONE 100 MG PO TABS
100.0000 mg | ORAL_TABLET | Freq: Every day | ORAL | 0 refills | Status: DC
  Filled 2023-05-14: qty 90, 90d supply, fill #0

## 2023-05-14 NOTE — Telephone Encounter (Signed)
Requested Prescriptions  Pending Prescriptions Disp Refills   spironolactone (ALDACTONE) 100 MG tablet 90 tablet 0    Sig: Take 1 tablet (100 mg total) by mouth daily. para la presion arterial     Cardiovascular: Diuretics - Aldosterone Antagonist Passed - 05/14/2023  7:56 AM      Passed - Cr in normal range and within 180 days    Creat  Date Value Ref Range Status  01/15/2013 0.81 0.50 - 1.10 mg/dL Final   Creatinine, Ser  Date Value Ref Range Status  11/18/2022 0.89 0.57 - 1.00 mg/dL Final         Passed - K in normal range and within 180 days    Potassium  Date Value Ref Range Status  11/18/2022 5.1 3.5 - 5.2 mmol/L Final         Passed - Na in normal range and within 180 days    Sodium  Date Value Ref Range Status  11/18/2022 140 134 - 144 mmol/L Final         Passed - eGFR is 30 or above and within 180 days    GFR calc Af Amer  Date Value Ref Range Status  09/21/2020 80 >59 mL/min/1.73 Final    Comment:    **In accordance with recommendations from the NKF-ASN Task force,**   [facility] is in the process of updating its eGFR calculation to the   2021 CKD-EPI creatinine equation that estimates kidney function   without a race variable.    GFR calc non Af Amer  Date Value Ref Range Status  09/21/2020 70 >59 mL/min/1.73 Final   eGFR  Date Value Ref Range Status  11/18/2022 71 >59 mL/min/1.73 Final         Passed - Last BP in normal range    BP Readings from Last 1 Encounters:  01/22/23 135/83         Passed - Valid encounter within last 6 months    Recent Outpatient Visits           3 months ago Acute pain of right knee   [facility] [place], [place], [place]   5 months ago Acute pain of right knee   [facility] [place], Shea Stakes, NP   8 months ago Breast pain, right   [facility] [place], [place], [place]   1 year ago Encounter for annual physical exam    [facility] [place], Shea Stakes, NP   1 year ago Essential hypertension   [facility] [place], Shea Stakes, NP       Future Appointments             In 1 week Claiborne Rigg, NP [employer] [facility]

## 2023-05-15 ENCOUNTER — Other Ambulatory Visit: Payer: Self-pay

## 2023-05-16 ENCOUNTER — Other Ambulatory Visit: Payer: Self-pay | Admitting: Nurse Practitioner

## 2023-05-16 DIAGNOSIS — Z1211 Encounter for screening for malignant neoplasm of colon: Secondary | ICD-10-CM

## 2023-05-16 DIAGNOSIS — Z1212 Encounter for screening for malignant neoplasm of rectum: Secondary | ICD-10-CM

## 2023-05-26 ENCOUNTER — Ambulatory Visit: Payer: Self-pay | Admitting: Nurse Practitioner

## 2023-06-19 ENCOUNTER — Other Ambulatory Visit: Payer: Self-pay | Admitting: Nurse Practitioner

## 2023-06-19 DIAGNOSIS — F419 Anxiety disorder, unspecified: Secondary | ICD-10-CM

## 2023-06-20 ENCOUNTER — Other Ambulatory Visit: Payer: Self-pay

## 2023-06-20 MED ORDER — ESCITALOPRAM OXALATE 10 MG PO TABS
10.0000 mg | ORAL_TABLET | Freq: Every day | ORAL | 0 refills | Status: DC
  Filled 2023-06-20: qty 30, 30d supply, fill #0

## 2023-06-20 NOTE — Telephone Encounter (Signed)
Requested Prescriptions  Pending Prescriptions Disp Refills   escitalopram (LEXAPRO) 10 MG tablet 30 tablet 0    Sig: Take 1 tablet (10 mg total) by mouth daily. para la ansiedad     Psychiatry:  Antidepressants - SSRI Passed - 06/19/2023  6:13 PM      Passed - Valid encounter within last 6 months    Recent Outpatient Visits           4 months ago Acute pain of right knee   [facility] [place]. [facility] [place], [place], [place]   7 months ago Acute pain of right knee   [place] [facility] [place]. [facility] Claiborne Rigg, NP   10 months ago Breast pain, right   [place] [facility] [place]. [facility] [place], Marzella [place], [place]   1 year ago Encounter for annual physical exam   [place] [facility] [place]. [facility] Claiborne Rigg, NP   1 year ago Essential hypertension   [place] [facility] [place]. [facility] Claiborne Rigg, [place]

## 2023-06-24 ENCOUNTER — Other Ambulatory Visit: Payer: Self-pay

## 2023-07-07 ENCOUNTER — Ambulatory Visit (INDEPENDENT_AMBULATORY_CARE_PROVIDER_SITE_OTHER): Payer: Self-pay

## 2023-07-07 DIAGNOSIS — I495 Sick sinus syndrome: Secondary | ICD-10-CM

## 2023-07-07 LAB — CUP PACEART REMOTE DEVICE CHECK
Battery Remaining Longevity: 113 mo
Battery Voltage: 3.01 V
Brady Statistic AP VP Percent: 1.34 %
Brady Statistic AP VS Percent: 94.82 %
Brady Statistic AS VP Percent: 0.01 %
Brady Statistic AS VS Percent: 3.82 %
Brady Statistic RA Percent Paced: 96.15 %
Brady Statistic RV Percent Paced: 1.36 %
Date Time Interrogation Session: 20241125031000
Implantable Lead Connection Status: 753985
Implantable Lead Connection Status: 753985
Implantable Lead Implant Date: 20200817
Implantable Lead Implant Date: 20200828
Implantable Lead Location: 753859
Implantable Lead Location: 753860
Implantable Lead Model: 5076
Implantable Lead Model: 5076
Implantable Pulse Generator Implant Date: 20200817
Lead Channel Impedance Value: 361 Ohm
Lead Channel Impedance Value: 399 Ohm
Lead Channel Impedance Value: 418 Ohm
Lead Channel Impedance Value: 475 Ohm
Lead Channel Pacing Threshold Amplitude: 0.5 V
Lead Channel Pacing Threshold Amplitude: 0.625 V
Lead Channel Pacing Threshold Pulse Width: 0.4 ms
Lead Channel Pacing Threshold Pulse Width: 0.4 ms
Lead Channel Sensing Intrinsic Amplitude: 10.875 mV
Lead Channel Sensing Intrinsic Amplitude: 10.875 mV
Lead Channel Sensing Intrinsic Amplitude: 2.125 mV
Lead Channel Sensing Intrinsic Amplitude: 2.125 mV
Lead Channel Setting Pacing Amplitude: 1.5 V
Lead Channel Setting Pacing Amplitude: 2.5 V
Lead Channel Setting Pacing Pulse Width: 0.4 ms
Lead Channel Setting Sensing Sensitivity: 1.2 mV
Zone Setting Status: 755011
Zone Setting Status: 755011

## 2023-07-25 ENCOUNTER — Other Ambulatory Visit: Payer: Self-pay

## 2023-07-25 ENCOUNTER — Other Ambulatory Visit: Payer: Self-pay | Admitting: Nurse Practitioner

## 2023-07-25 DIAGNOSIS — F419 Anxiety disorder, unspecified: Secondary | ICD-10-CM

## 2023-07-25 MED ORDER — ESCITALOPRAM OXALATE 10 MG PO TABS
10.0000 mg | ORAL_TABLET | Freq: Every day | ORAL | 0 refills | Status: DC
  Filled 2023-07-25: qty 22, 22d supply, fill #0

## 2023-07-25 NOTE — Telephone Encounter (Signed)
Appointment 08/15/23- patient has had courtesy refill with notes- #22 given Requested Prescriptions  Pending Prescriptions Disp Refills   escitalopram (LEXAPRO) 10 MG tablet 30 tablet 0    Sig: Take 1 tablet (10 mg total) by mouth daily. para la ansiedad     Psychiatry:  Antidepressants - SSRI Failed - 07/25/2023 12:19 PM      Failed - Valid encounter within last 6 months    Recent Outpatient Visits           6 months ago Acute pain of right knee   [place] [facility] [place]. [facility] [place], [place], [place]   8 months ago Acute pain of right knee   [place] [facility] [place]. [facility] Claiborne Rigg, NP   11 months ago Breast pain, right   [place] [facility] [place]. [facility] [place], [place]zella [place], [place]   1 year ago Encounter for annual physical exam   [place] [facility] [place]. [facility] Claiborne Rigg, NP   2 years ago Essential hypertension   [place] [facility] [place] [facility] [place]. [facility] Claiborne Rigg, NP       Future Appointments             In 3 weeks Claiborne Rigg, NP [facility] [place]. [facility]

## 2023-07-29 ENCOUNTER — Other Ambulatory Visit: Payer: Self-pay

## 2023-07-29 ENCOUNTER — Other Ambulatory Visit: Payer: Self-pay | Admitting: Nurse Practitioner

## 2023-07-29 DIAGNOSIS — I10 Essential (primary) hypertension: Secondary | ICD-10-CM

## 2023-07-29 MED ORDER — SPIRONOLACTONE 100 MG PO TABS
100.0000 mg | ORAL_TABLET | Freq: Every day | ORAL | 0 refills | Status: DC
  Filled 2023-07-29 – 2023-08-04 (×2): qty 90, 90d supply, fill #0

## 2023-08-01 NOTE — Progress Notes (Signed)
Remote pacemaker transmission.   

## 2023-08-04 ENCOUNTER — Other Ambulatory Visit: Payer: Self-pay

## 2023-08-07 ENCOUNTER — Other Ambulatory Visit: Payer: Self-pay

## 2023-08-15 ENCOUNTER — Ambulatory Visit: Payer: No Typology Code available for payment source | Attending: Nurse Practitioner | Admitting: Nurse Practitioner

## 2023-08-26 ENCOUNTER — Other Ambulatory Visit: Payer: Self-pay | Admitting: Nurse Practitioner

## 2023-08-26 DIAGNOSIS — F419 Anxiety disorder, unspecified: Secondary | ICD-10-CM

## 2023-09-01 ENCOUNTER — Encounter: Payer: Self-pay | Admitting: Pharmacist

## 2023-09-01 ENCOUNTER — Other Ambulatory Visit: Payer: Self-pay

## 2023-09-19 ENCOUNTER — Encounter: Payer: No Typology Code available for payment source | Admitting: Nurse Practitioner

## 2023-10-06 ENCOUNTER — Ambulatory Visit (INDEPENDENT_AMBULATORY_CARE_PROVIDER_SITE_OTHER): Payer: No Typology Code available for payment source

## 2023-10-06 DIAGNOSIS — I495 Sick sinus syndrome: Secondary | ICD-10-CM

## 2023-10-07 LAB — CUP PACEART REMOTE DEVICE CHECK
Battery Remaining Longevity: 112 mo
Battery Voltage: 3.01 V
Brady Statistic AP VP Percent: 1.7 %
Brady Statistic AP VS Percent: 94.53 %
Brady Statistic AS VP Percent: 0 %
Brady Statistic AS VS Percent: 3.76 %
Brady Statistic RA Percent Paced: 96.23 %
Brady Statistic RV Percent Paced: 1.71 %
Date Time Interrogation Session: 20250223224607
Implantable Lead Connection Status: 753985
Implantable Lead Connection Status: 753985
Implantable Lead Implant Date: 20200817
Implantable Lead Implant Date: 20200828
Implantable Lead Location: 753859
Implantable Lead Location: 753860
Implantable Lead Model: 5076
Implantable Lead Model: 5076
Implantable Pulse Generator Implant Date: 20200817
Lead Channel Impedance Value: 342 Ohm
Lead Channel Impedance Value: 361 Ohm
Lead Channel Impedance Value: 399 Ohm
Lead Channel Impedance Value: 532 Ohm
Lead Channel Pacing Threshold Amplitude: 0.5 V
Lead Channel Pacing Threshold Amplitude: 0.625 V
Lead Channel Pacing Threshold Pulse Width: 0.4 ms
Lead Channel Pacing Threshold Pulse Width: 0.4 ms
Lead Channel Sensing Intrinsic Amplitude: 1.625 mV
Lead Channel Sensing Intrinsic Amplitude: 1.625 mV
Lead Channel Sensing Intrinsic Amplitude: 10 mV
Lead Channel Sensing Intrinsic Amplitude: 10 mV
Lead Channel Setting Pacing Amplitude: 1.5 V
Lead Channel Setting Pacing Amplitude: 2.5 V
Lead Channel Setting Pacing Pulse Width: 0.4 ms
Lead Channel Setting Sensing Sensitivity: 1.2 mV
Zone Setting Status: 755011
Zone Setting Status: 755011

## 2023-10-12 ENCOUNTER — Encounter: Payer: Self-pay | Admitting: Internal Medicine

## 2023-10-22 ENCOUNTER — Encounter: Payer: Self-pay | Admitting: Nurse Practitioner

## 2023-10-22 ENCOUNTER — Other Ambulatory Visit: Payer: Self-pay

## 2023-10-22 ENCOUNTER — Ambulatory Visit: Payer: Self-pay | Attending: Nurse Practitioner | Admitting: Nurse Practitioner

## 2023-10-22 VITALS — BP 159/89 | HR 80 | Resp 19 | Ht 60.0 in | Wt 242.6 lb

## 2023-10-22 DIAGNOSIS — Z0000 Encounter for general adult medical examination without abnormal findings: Secondary | ICD-10-CM

## 2023-10-22 DIAGNOSIS — M25561 Pain in right knee: Secondary | ICD-10-CM

## 2023-10-22 DIAGNOSIS — F419 Anxiety disorder, unspecified: Secondary | ICD-10-CM

## 2023-10-22 DIAGNOSIS — I10 Essential (primary) hypertension: Secondary | ICD-10-CM

## 2023-10-22 DIAGNOSIS — K219 Gastro-esophageal reflux disease without esophagitis: Secondary | ICD-10-CM

## 2023-10-22 DIAGNOSIS — E7800 Pure hypercholesterolemia, unspecified: Secondary | ICD-10-CM

## 2023-10-22 DIAGNOSIS — L309 Dermatitis, unspecified: Secondary | ICD-10-CM

## 2023-10-22 DIAGNOSIS — G8929 Other chronic pain: Secondary | ICD-10-CM

## 2023-10-22 MED ORDER — ESCITALOPRAM OXALATE 10 MG PO TABS
10.0000 mg | ORAL_TABLET | Freq: Every day | ORAL | 1 refills | Status: DC
  Filled 2023-10-22: qty 90, 90d supply, fill #0
  Filled 2024-01-20 (×2): qty 90, 90d supply, fill #1

## 2023-10-22 MED ORDER — AMLODIPINE BESYLATE 10 MG PO TABS
10.0000 mg | ORAL_TABLET | Freq: Every day | ORAL | 1 refills | Status: DC
  Filled 2023-10-22: qty 90, 90d supply, fill #0

## 2023-10-22 MED ORDER — OMEPRAZOLE 20 MG PO CPDR
20.0000 mg | DELAYED_RELEASE_CAPSULE | Freq: Every day | ORAL | 3 refills | Status: DC
  Filled 2023-10-22: qty 30, 30d supply, fill #0
  Filled 2024-01-20: qty 30, 30d supply, fill #1
  Filled 2024-03-29: qty 60, 60d supply, fill #2

## 2023-10-22 MED ORDER — ATORVASTATIN CALCIUM 20 MG PO TABS
20.0000 mg | ORAL_TABLET | Freq: Every day | ORAL | 3 refills | Status: AC
  Filled 2023-10-22: qty 90, 90d supply, fill #0
  Filled 2024-03-29: qty 90, 90d supply, fill #1

## 2023-10-22 MED ORDER — MELOXICAM 7.5 MG PO TABS
7.5000 mg | ORAL_TABLET | Freq: Every day | ORAL | 1 refills | Status: DC
  Filled 2023-10-22: qty 90, 90d supply, fill #0

## 2023-10-22 MED ORDER — TRIAMCINOLONE ACETONIDE 0.1 % EX CREA
1.0000 | TOPICAL_CREAM | Freq: Two times a day (BID) | CUTANEOUS | 1 refills | Status: DC
  Filled 2023-10-22: qty 80, 40d supply, fill #0
  Filled 2024-01-20: qty 80, 40d supply, fill #1

## 2023-10-22 MED ORDER — SPIRONOLACTONE 100 MG PO TABS
100.0000 mg | ORAL_TABLET | Freq: Every day | ORAL | 0 refills | Status: DC
  Filled 2023-10-22 – 2023-10-26 (×2): qty 90, 90d supply, fill #0

## 2023-10-22 NOTE — Progress Notes (Signed)
 Assessment & Plan:  Anita [name] was seen today for annual exam.  Diagnoses and all orders for this visit:  Encounter for annual physical exam -     CMP14+EGFR -     CBC with Differential -     Lipid Panel   Gastroesophageal reflux disease without esophagitis -     omeprazole (PRILOSEC) 20 MG capsule; Take 1 capsule (20 mg total) by mouth daily. Reduce or eliminate spicy foods, soda/sparkling and caffeine Education to decrease spicy foods, caffeine and soda/sparkling drinks. 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 Discussed limiting carbohydrates, and foods high in sugar. Walk for exercise 3-5 times per week at least 30 minutes to reduce weight.   Essential hypertension -     amLODipine (NORVASC) 10 MG tablet; Take 1 tablet (10 mg total) by mouth daily. para la presion arterial -     spironolactone (ALDACTONE) 100 MG tablet; Take 1 tablet (100 mg total) by mouth daily. para la presion arterial DASH diet can help with lowering blood pressure and weight loss Exercise 3-5 times per week at least 30 minutes  Drink water to stay hydrated  Pure hypercholesterolemia -     atorvastatin (LIPITOR) 20 MG tablet; Take 1 tablet (20 mg total) by mouth daily. FOR CHOLESTEROL Take medication as prescribed  Anxiety -     escitalopram (LEXAPRO) 10 MG tablet; Take 1 tablet (10 mg total) by mouth daily. para la ansiedad  Acute pain of right knee -     meloxicam (MOBIC) 7.5 MG tablet; Take 1 tablet (7.5 mg total) by mouth daily. FOR KNEE PAIN Take with food Take medication with food Medication can cause stomach upset  Eczema, unspecified type -     triamcinolone cream (KENALOG) 0.1 %; Apply 1 Application topically 2 (two) times daily. Continue to use cream on affected area     Patient has been counseled on age-appropriate routine health concerns for screening and prevention. These are reviewed and up-to-date. Referrals have been placed accordingly. Immunizations are up-to-date or  declined.    Subjective:   Chief Complaint  Patient presents with   Annual Exam    Anita [name] 68 y.o. female presents to office today for annual physical. Spanish interpreter used via Status with having to repeat questions due to language barrier.   Patient states she having epigastric burning that radiates up in her throat after eating spicy foods.   Patient presented with medication bottles, each bottle contained medications with refill dates greater than 90 days.  States she was taking medications at home, unsure if she taking medications correctly. Attempted to get daughter involved in conversation but she did not want to participate. States she works during the day and not sure if patient takes meds. Daughter declined to put in a pill planner. Blood pressure elevated 144/82 mmHg and rechecked was 159/89 mmHg. Educated patient to take medication each day and contact PCP if she needs medication refilled.   Daughter was in room initially with patient's during visit but she left room.     Review of Systems  Constitutional: Negative.   HENT: Negative.    Eyes: Negative.   Respiratory: Negative.    Cardiovascular: Negative.   Gastrointestinal:  Positive for heartburn.  Genitourinary: Negative.   Musculoskeletal: Negative.   Skin:  Positive for rash.       Right lower leg  Endo/Heme/Allergies: Negative.   Psychiatric/Behavioral: Negative.     Past Medical History:  Diagnosis Date   ABDOMINAL PAIN  05/05/2009   Qualifier: Diagnosis of  By: Huntley Dec, Scott     Adjustment disorder with depressed mood 08/30/2009   Qualifier: Diagnosis of  By: Huntley Dec, Scott     ALLERGIC RHINITIS 12/04/2009   Qualifier: Diagnosis of  By: Huntley Dec, Scott     Allergy    Anxiety    Aortic stenosis    mild by echo 03/2019   CHOLELITHIASIS 05/30/2009   Qualifier: Diagnosis of  By: Huntley Dec, Scott     COLONIC POLYPS, HX OF 05/09/2010   Qualifier: Diagnosis of  By: Brynda Rim     Depression    Essential hypertension, benign 05/05/2009   Qualifier: Diagnosis of  By: Huntley Dec, Scott     GERD 06/12/2009   Qualifier: Diagnosis of  By: Huntley Dec, Scott     Heart murmur    Pacemaker   Hypertension    KNEE PAIN 06/12/2009   Qualifier: Diagnosis of  By: Brynda Rim     LIVER MASS 05/30/2009   Qualifier: Diagnosis of  By: Huntley Dec, Scott     OBESITY 10/24/2010   Qualifier: Diagnosis of  By: Felicita Gage RN, Melinda     Sleep apnea    sleep study on hold   TINNITUS, CHRONIC, RIGHT 09/07/2010   Qualifier: Diagnosis of  By: Delrae Alfred MD, [place] Bailiff LOSS 09/07/2010   Qualifier: Diagnosis of  By: Delrae Alfred MD, Paula Libra VAGINITIS 08/30/2009   Qualifier: Diagnosis of  By: Huntley Dec, Scott     VARICOSE VEINS, LOWER EXTREMITIES 05/05/2009   Qualifier: Diagnosis of  By: Brynda Rim      Past Surgical History:  Procedure Laterality Date   APPENDECTOMY     CESAREAN SECTION WITH BILATERAL TUBAL LIGATION     CHOLECYSTECTOMY     COLONOSCOPY  2011   LEAD REVISION/REPAIR N/A 04/09/2019   Procedure: LEAD REVISION/REPAIR;  Surgeon: Marinus Maw, MD;  Location: [facility];  Service: Cardiovascular;  Laterality: N/A;   PACEMAKER IMPLANT N/A 03/29/2019   Procedure: PACEMAKER IMPLANT;  Surgeon: Marinus Maw, MD;  Location: [facility] INVAS[facility];  Service: Cardiovascular;  Laterality: N/A;    Family History  Problem Relation Age of Onset   Hypertension Sister    Hyperlipidemia Sister    Hypertension Sister    Hyperlipidemia Sister    Cancer Sister    Stomach cancer Sister        passed away with the CA   Hypertension Sister    Hyperlipidemia Sister    Migraines Daughter    Diabetes Paternal Grandmother    Hypertension Paternal Grandmother    Hypertension Brother    Hyperlipidemia Brother    Hypertension Brother    Hyperlipidemia Brother    Hypertension Brother    Hyperlipidemia Brother    Hypertension Brother     Hyperlipidemia Brother    Hypertension Brother    Hyperlipidemia Brother    Colon cancer Neg Hx    Colon polyps Neg Hx    Esophageal cancer Neg Hx    Rectal cancer Neg Hx    Breast cancer Neg Hx     Social History Reviewed with no changes to be made today.   Outpatient Medications Prior to Visit  Medication Sig Dispense Refill   amLODipine (NORVASC) 10 MG tablet Take 1 tablet (10 mg total) by mouth daily. para la presion arterial 90 tablet 1   atorvastatin (LIPITOR) 20 MG  tablet Take 1 tablet (20 mg total) by mouth daily. FOR CHOLESTEROL 90 tablet 3   meloxicam (MOBIC) 7.5 MG tablet Take 1 tablet (7.5 mg total) by mouth daily. FOR KNEE PAIN Take with food 90 tablet 1   spironolactone (ALDACTONE) 100 MG tablet Take 1 tablet (100 mg total) by mouth daily. para la presion arterial 90 tablet 0   triamcinolone cream (KENALOG) 0.1 % Apply 1 Application topically 2 (two) times daily. 80 g 1   gabapentin (NEURONTIN) 100 MG capsule Take 1 capsule (100 mg total) by mouth at bedtime. para el dolor de piernas 90 capsule 3   escitalopram (LEXAPRO) 10 MG tablet Take 1 tablet (10 mg total) by mouth daily. para la ansiedad 22 tablet 0   No facility-administered medications prior to visit.    Allergies  Allergen Reactions   Chlorthalidone     hypokalemia   Lisinopril Other (See Comments)    Chills and arm pain       Objective:    BP (!) 159/89 (BP Location: Right Arm, Patient Position: Sitting, Cuff Size: Normal)   Pulse 80   Resp 19   Ht 5' (1.524 m)   Wt 242 lb 9.6 oz (110 kg)   SpO2 100%   BMI 47.38 kg/m  Wt Readings from Last 3 Encounters:  10/22/23 242 lb 9.6 oz (110 kg)  01/22/23 244 lb (110.7 kg)  11/18/22 243 lb (110.2 kg)   BP Readings from Last 3 Encounters:  10/22/23 (!) 159/89  01/22/23 135/83  11/18/22 137/77    Physical Exam Vitals and nursing note reviewed.  Constitutional:      General: She is not in acute distress.    Appearance: Normal appearance.  HENT:      Head: Normocephalic.     Right Ear: Tympanic membrane, ear canal and external ear normal.     Left Ear: Tympanic membrane, ear canal and external ear normal.     Nose: Nose normal.     Mouth/Throat:     Mouth: Mucous membranes are moist.     Pharynx: Oropharynx is clear.  Eyes:     Conjunctiva/sclera: Conjunctivae normal.     Pupils: Pupils are equal, round, and reactive to light.  Neck:     Thyroid: No thyroid mass or thyroid tenderness.     Vascular: No JVD.  Cardiovascular:     Rate and Rhythm: Normal rate and regular rhythm.     Pulses: Normal pulses.          Dorsalis pedis pulses are 2+ on the right side and 2+ on the left side.       Posterior tibial pulses are 2+ on the right side and 2+ on the left side.     Heart sounds: Normal heart sounds.  Pulmonary:     Effort: Pulmonary effort is normal.     Breath sounds: Normal breath sounds.  Abdominal:     General: Abdomen is protuberant. Bowel sounds are normal.     Palpations: Abdomen is soft.     Comments: protuberant  Musculoskeletal:        General: Normal range of motion.     Cervical back: Full passive range of motion without pain, normal range of motion and neck supple.       Feet:  Feet:     Right foot:     Skin integrity: Dry skin present.     Toenail Condition: Right toenails are abnormally thick.     Left foot:  Skin integrity: Dry skin present.     Toenail Condition: Left toenails are abnormally thick.     Comments: rash Skin:    General: Skin is warm and dry.  Neurological:     Mental Status: She is alert and oriented to person, place, and time.     Sensory: Sensation is intact.     Motor: Motor function is intact.     Coordination: Coordination is intact.     Gait: Gait is intact.  Psychiatric:        Attention and Perception: Attention normal.        Mood and Affect: Mood normal.        Speech: Speech normal.        Behavior: Behavior normal.        Thought Content: Thought content normal.         Judgment: Judgment normal.     Comments: Language barriers        Patient has been counseled extensively about nutrition and exercise as well as the importance of adherence with medications and regular follow-up. The patient was given clear instructions to go to ER or return to medical center if symptoms don't improve, worsen or new problems develop. The patient verbalized understanding.   Follow-up: Return in about 3 months (around 01/22/2024).   Joette Catching, BSN RN -student FNP [facility] [place], [place] [phone]   10/22/2023, 2:12 PM

## 2023-10-22 NOTE — Progress Notes (Signed)
 I have seen and examined this patient with the advanced practice provider STUDENT and agree with the note below

## 2023-10-23 LAB — CMP14+EGFR
ALT: 18 IU/L (ref 0–32)
AST: 21 IU/L (ref 0–40)
Albumin: 4.5 g/dL (ref 3.9–4.9)
Alkaline Phosphatase: 70 IU/L (ref 44–121)
BUN/Creatinine Ratio: 18 (ref 12–28)
BUN: 17 mg/dL (ref 8–27)
Bilirubin Total: 0.6 mg/dL (ref 0.0–1.2)
CO2: 21 mmol/L (ref 20–29)
Calcium: 9.7 mg/dL (ref 8.7–10.3)
Chloride: 103 mmol/L (ref 96–106)
Creatinine, Ser: 0.96 mg/dL (ref 0.57–1.00)
Globulin, Total: 3 g/dL (ref 1.5–4.5)
Glucose: 94 mg/dL (ref 70–99)
Potassium: 5 mmol/L (ref 3.5–5.2)
Sodium: 139 mmol/L (ref 134–144)
Total Protein: 7.5 g/dL (ref 6.0–8.5)
eGFR: 64 mL/min/{1.73_m2} (ref >59)

## 2023-10-23 LAB — CBC WITH DIFFERENTIAL/PLATELET
Basophils Absolute: 0 10*3/uL (ref 0.0–0.2)
Basos: 1 %
EOS (ABSOLUTE): 0.3 10*3/uL (ref 0.0–0.4)
Eos: 4 %
Hematocrit: 45.1 % (ref 34.0–46.6)
Hemoglobin: 14.6 g/dL (ref 11.1–15.9)
Immature Grans (Abs): 0 10*3/uL (ref 0.0–0.1)
Immature Granulocytes: 1 %
Lymphocytes Absolute: 2.2 10*3/uL (ref 0.7–3.1)
Lymphs: 35 %
MCH: 30.8 pg (ref 26.6–33.0)
MCHC: 32.4 g/dL (ref 31.5–35.7)
MCV: 95 fL (ref 79–97)
Monocytes Absolute: 0.3 10*3/uL (ref 0.1–0.9)
Monocytes: 5 %
Neutrophils Absolute: 3.4 10*3/uL (ref 1.4–7.0)
Neutrophils: 54 %
Platelets: 230 10*3/uL (ref 150–450)
RBC: 4.74 x10E6/uL (ref 3.77–5.28)
RDW: 12.5 % (ref 11.7–15.4)
WBC: 6.2 10*3/uL (ref 3.4–10.8)

## 2023-10-23 LAB — LIPID PANEL
Chol/HDL Ratio: 4.4 ratio (ref 0.0–4.4)
Cholesterol, Total: 193 mg/dL (ref 100–199)
HDL: 44 mg/dL (ref >39)
LDL Chol Calc (NIH): 119 mg/dL — ABNORMAL HIGH (ref 0–99)
Triglycerides: 172 mg/dL — ABNORMAL HIGH (ref 0–149)
VLDL Cholesterol Cal: 30 mg/dL (ref 5–40)

## 2023-10-24 ENCOUNTER — Other Ambulatory Visit: Payer: Self-pay

## 2023-10-26 ENCOUNTER — Encounter: Payer: Self-pay | Admitting: Nurse Practitioner

## 2023-10-27 ENCOUNTER — Other Ambulatory Visit: Payer: Self-pay

## 2023-10-30 ENCOUNTER — Other Ambulatory Visit: Payer: Self-pay

## 2023-11-11 NOTE — Addendum Note (Signed)
 Addended by: Geralyn Flash D on: 11/11/2023 04:59 PM   Modules accepted: Orders

## 2023-11-11 NOTE — Progress Notes (Signed)
 Remote pacemaker transmission.

## 2024-01-06 ENCOUNTER — Ambulatory Visit (INDEPENDENT_AMBULATORY_CARE_PROVIDER_SITE_OTHER): Payer: No Typology Code available for payment source

## 2024-01-06 DIAGNOSIS — I495 Sick sinus syndrome: Secondary | ICD-10-CM

## 2024-01-07 LAB — CUP PACEART REMOTE DEVICE CHECK
Battery Remaining Longevity: 106 mo
Battery Voltage: 2.99 V
Brady Statistic AP VP Percent: 79.5 %
Brady Statistic AP VS Percent: 14.31 %
Brady Statistic AS VP Percent: 5 %
Brady Statistic AS VS Percent: 1.19 %
Brady Statistic RA Percent Paced: 93.81 %
Brady Statistic RV Percent Paced: 84.5 %
Date Time Interrogation Session: 20250527014058
Implantable Lead Connection Status: 753985
Implantable Lead Connection Status: 753985
Implantable Lead Implant Date: 20200817
Implantable Lead Implant Date: 20200828
Implantable Lead Location: 753859
Implantable Lead Location: 753860
Implantable Lead Model: 5076
Implantable Lead Model: 5076
Implantable Pulse Generator Implant Date: 20200817
Lead Channel Impedance Value: 361 Ohm
Lead Channel Impedance Value: 380 Ohm
Lead Channel Impedance Value: 399 Ohm
Lead Channel Impedance Value: 475 Ohm
Lead Channel Pacing Threshold Amplitude: 0.5 V
Lead Channel Pacing Threshold Amplitude: 0.625 V
Lead Channel Pacing Threshold Pulse Width: 0.4 ms
Lead Channel Pacing Threshold Pulse Width: 0.4 ms
Lead Channel Sensing Intrinsic Amplitude: 1.625 mV
Lead Channel Sensing Intrinsic Amplitude: 1.625 mV
Lead Channel Sensing Intrinsic Amplitude: 11.25 mV
Lead Channel Sensing Intrinsic Amplitude: 11.25 mV
Lead Channel Setting Pacing Amplitude: 1.5 V
Lead Channel Setting Pacing Amplitude: 2.5 V
Lead Channel Setting Pacing Pulse Width: 0.4 ms
Lead Channel Setting Sensing Sensitivity: 1.2 mV
Zone Setting Status: 755011
Zone Setting Status: 755011

## 2024-01-09 ENCOUNTER — Ambulatory Visit: Payer: Self-pay | Admitting: Internal Medicine

## 2024-01-20 ENCOUNTER — Other Ambulatory Visit: Payer: Self-pay | Admitting: Nurse Practitioner

## 2024-01-20 ENCOUNTER — Other Ambulatory Visit: Payer: Self-pay

## 2024-01-20 DIAGNOSIS — I10 Essential (primary) hypertension: Secondary | ICD-10-CM

## 2024-01-20 MED ORDER — SPIRONOLACTONE 100 MG PO TABS
100.0000 mg | ORAL_TABLET | Freq: Every day | ORAL | 0 refills | Status: DC
  Filled 2024-01-20: qty 90, 90d supply, fill #0

## 2024-01-21 ENCOUNTER — Other Ambulatory Visit: Payer: Self-pay

## 2024-01-23 ENCOUNTER — Encounter: Payer: Self-pay | Admitting: Nurse Practitioner

## 2024-01-23 ENCOUNTER — Other Ambulatory Visit: Payer: Self-pay

## 2024-01-23 ENCOUNTER — Ambulatory Visit: Payer: Self-pay | Attending: Nurse Practitioner | Admitting: Nurse Practitioner

## 2024-01-23 VITALS — BP 118/74 | HR 77 | Resp 19 | Ht 60.0 in | Wt 236.0 lb

## 2024-01-23 DIAGNOSIS — I10 Essential (primary) hypertension: Secondary | ICD-10-CM

## 2024-01-23 DIAGNOSIS — F5101 Primary insomnia: Secondary | ICD-10-CM

## 2024-01-23 DIAGNOSIS — Z23 Encounter for immunization: Secondary | ICD-10-CM

## 2024-01-23 DIAGNOSIS — G629 Polyneuropathy, unspecified: Secondary | ICD-10-CM

## 2024-01-23 MED ORDER — TRAZODONE HCL 50 MG PO TABS
50.0000 mg | ORAL_TABLET | Freq: Every evening | ORAL | 0 refills | Status: DC | PRN
  Filled 2024-01-23: qty 60, 30d supply, fill #0

## 2024-01-23 MED ORDER — GABAPENTIN 100 MG PO CAPS
100.0000 mg | ORAL_CAPSULE | Freq: Three times a day (TID) | ORAL | 3 refills | Status: AC | PRN
  Filled 2024-01-23: qty 90, 30d supply, fill #0

## 2024-01-23 MED ORDER — AMLODIPINE BESYLATE 10 MG PO TABS
10.0000 mg | ORAL_TABLET | Freq: Every day | ORAL | 1 refills | Status: AC
  Filled 2024-01-23: qty 90, 90d supply, fill #0
  Filled 2024-09-16 (×2): qty 90, 90d supply, fill #1

## 2024-01-23 MED ORDER — SPIRONOLACTONE 100 MG PO TABS
100.0000 mg | ORAL_TABLET | Freq: Every day | ORAL | 0 refills | Status: DC
  Filled 2024-01-23 – 2024-02-04 (×3): qty 90, 90d supply, fill #0

## 2024-01-23 NOTE — Progress Notes (Signed)
 Assessment & Plan:  Anita [name] was seen today for hypertension.  Diagnoses and all orders for this visit:  Essential hypertension -     amLODipine  (NORVASC ) 10 MG tablet; Take 1 tablet (10 mg total) by mouth daily. para la presion arterial -     spironolactone  (ALDACTONE ) 100 MG tablet; Take 1 tablet (100 mg total) by mouth daily. para la presion arterial Continue all antihypertensives as prescribed.  Reminded to bring in blood pressure log for follow  up appointment.  RECOMMENDATIONS: DASH/Mediterranean Diets are healthier choices for HTN.    Primary insomnia -     traZODone  (DESYREL ) 50 MG tablet; Take 1-2 tablets (50-100 mg total) by mouth at bedtime as needed for sleep. PARA DORMIR  Neuropathy -     gabapentin  (NEURONTIN ) 100 MG capsule; Take 1 capsule (100 mg total) by mouth 3 (three) times daily as needed. para el dolor de piernas. tomar solo lo necesario  Need for shingles vaccine -     Varicella-zoster vaccine IM    Patient has been counseled on age-appropriate routine health concerns for screening and prevention. These are reviewed and up-to-date. Referrals have been placed accordingly. Immunizations are up-to-date or declined.    Subjective:   Chief Complaint  Patient presents with   Hypertension    Anita [name] 68 y.o. 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   VRI was used to communicate directly with patient for the entire encounter including  providing detailed patient instructions.     Blood pressure is well controlled. She is out of amlodipine  and spironolactone .  BP Readings from Last 3 Encounters:  01/23/24 118/74  10/22/23 (!) 159/89  01/22/23 135/83    Endorses pain in her bilateral legs and describes the pain as feeling tired. She takes gabapentin  as needed for her pain. BMI 46   Insomnia Has trouble falling asleep and staying asleep. She is also drinking caffeine at night which I have advised against.  had started her on trazodone  a few years ago however she can not recall if she took this medication or if it was effective.    Review of Systems  Constitutional:  Negative for fever, malaise/fatigue and weight loss.  HENT: Negative.  Negative for nosebleeds.   Eyes: Negative.  Negative for blurred vision, double vision and photophobia.  Respiratory: Negative.  Negative for cough and shortness of breath.   Cardiovascular: Negative.  Negative for chest pain, palpitations and leg swelling.  Gastrointestinal: Negative.  Negative for heartburn, nausea and vomiting.  Musculoskeletal:  Positive for joint pain and myalgias.  Neurological: Negative.  Negative for dizziness, focal weakness, seizures and headaches.  Psychiatric/Behavioral:  Negative for suicidal ideas. The patient has insomnia.     Past Medical History:  Diagnosis Date   ABDOMINAL PAIN 05/05/2009   Qualifier: Diagnosis of  By: Earle Glatter, Scott     Adjustment disorder with depressed mood 08/30/2009   Qualifier: Diagnosis of  By: Earle Glatter, Scott     ALLERGIC RHINITIS 12/04/2009   Qualifier: Diagnosis of  By: Earle Glatter, Scott     Allergy    Anxiety  Aortic stenosis    mild by echo 03/2019   CHOLELITHIASIS 05/30/2009   Qualifier: Diagnosis of  By: Earle Glatter, Scott     COLONIC POLYPS, HX OF 05/09/2010   Qualifier: Diagnosis of  By: Earle Glatter, Scott     Depression    Essential hypertension, benign 05/05/2009   Qualifier: Diagnosis of  By: Earle Glatter, Scott     GERD 06/12/2009   Qualifier: Diagnosis of  By: Earle Glatter, Scott     Heart murmur    Pacemaker   Hypertension    KNEE PAIN 06/12/2009   Qualifier: Diagnosis of  By: Billey Budds     LIVER MASS 05/30/2009   Qualifier: Diagnosis of  By: Earle Glatter, Scott     OBESITY 10/24/2010   Qualifier: Diagnosis of  By: Eleuterio Grimes RN, Melinda     Sleep apnea    sleep study on hold   TINNITUS, CHRONIC, RIGHT 09/07/2010   Qualifier: Diagnosis of  By: Jayne Mews MD, Geraldo Klippel LOSS 09/07/2010   Qualifier: Diagnosis of  By: Jayne Mews MD, Samie Crews VAGINITIS 08/30/2009   Qualifier: Diagnosis of  By: Earle Glatter, Scott     VARICOSE VEINS, LOWER EXTREMITIES 05/05/2009   Qualifier: Diagnosis of  By: Billey Budds      Past Surgical History:  Procedure Laterality Date   APPENDECTOMY     CESAREAN SECTION WITH BILATERAL TUBAL LIGATION     CHOLECYSTECTOMY     COLONOSCOPY  2011   LEAD REVISION/REPAIR N/A 04/09/2019   Procedure: LEAD REVISION/REPAIR;  Surgeon: Tammie Fall, MD;  Location: [facility];  Service: Cardiovascular;  Laterality: N/A;   PACEMAKER IMPLANT N/A 03/29/2019   Procedure: PACEMAKER IMPLANT;  Surgeon: Tammie Fall, MD;  Location: [facility];  Service: Cardiovascular;  Laterality: N/A;    Family History  Problem Relation Age of Onset   Hypertension Sister    Hyperlipidemia Sister    Hypertension Sister    Hyperlipidemia Sister    Cancer Sister    Stomach cancer Sister        passed away with the CA   Hypertension Sister    Hyperlipidemia Sister    Migraines Daughter    Diabetes Paternal Grandmother    Hypertension Paternal Grandmother    Hypertension Brother    Hyperlipidemia Brother    Hypertension Brother    Hyperlipidemia Brother    Hypertension Brother    Hyperlipidemia Brother    Hypertension Brother    Hyperlipidemia Brother    Hypertension Brother    Hyperlipidemia Brother    Colon cancer Neg Hx     Colon polyps Neg Hx    Esophageal cancer Neg Hx    Rectal cancer Neg Hx    Breast cancer Neg Hx     Social History Reviewed with no changes to be made today.   Outpatient Medications Prior to Visit  Medication Sig Dispense Refill   atorvastatin  (LIPITOR) 20 MG tablet Take 1 tablet (20 mg total) by mouth daily. FOR CHOLESTEROL 90 tablet 3   escitalopram  (LEXAPRO ) 10 MG tablet Take 1 tablet (10 mg total) by mouth daily. para la ansiedad 90 tablet 1   omeprazole  (PRILOSEC) 20 MG capsule Take 1 capsule (20 mg total) by mouth daily. 30 capsule 3   triamcinolone  cream (KENALOG ) 0.1 % Apply 1 Application topically 2 (two) times daily. 80 g 1   amLODipine  (NORVASC ) 10 MG tablet Take 1  tablet (10 mg total) by mouth daily. para la presion arterial 90 tablet 1   gabapentin  (NEURONTIN ) 100 MG capsule Take 1 capsule (100 mg total) by mouth at bedtime. para el dolor de piernas (Patient taking differently: Take 1 capsule (100 mg total) by mouth at bedtime. para el dolor de piernas) 90 capsule 3   spironolactone  (ALDACTONE ) 100 MG tablet Take 1 tablet (100 mg total) by mouth daily. para la presion arterial 90 tablet 0   meloxicam  (MOBIC ) 7.5 MG tablet Take 1 tablet (7.5 mg total) by mouth daily. FOR KNEE PAIN Take with food (Patient not taking: Reported on 01/23/2024) 90 tablet 1   No facility-administered medications prior to visit.    Allergies  Allergen Reactions   Chlorthalidone      hypokalemia   Lisinopril  Other (See Comments)    Chills and arm pain       Objective:    BP 118/74 (BP Location: Left Arm, Patient Position: Sitting, Cuff Size: Normal)   Pulse 77   Resp 19   Ht 5' (1.524 m)   Wt 236 lb (107 kg)   SpO2 98%   BMI 46.09 kg/m  Wt Readings from Last 3 Encounters:  01/23/24 236 lb (107 kg)  10/22/23 242 lb 9.6 oz (110 kg)  01/22/23 244 lb (110.7 kg)    Physical Exam Vitals and nursing note reviewed.  Constitutional:      Appearance: She is well-developed. She is obese.   HENT:     Head: Normocephalic and atraumatic.   Cardiovascular:     Rate and Rhythm: Normal rate and regular rhythm.     Heart sounds: Normal heart sounds. No murmur heard.    No friction rub. No gallop.  Pulmonary:     Effort: Pulmonary effort is normal. No tachypnea or respiratory distress.     Breath sounds: Normal breath sounds. No decreased breath sounds, wheezing, rhonchi or rales.  Chest:     Chest wall: No tenderness.  Abdominal:     General: Bowel sounds are normal.     Palpations: Abdomen is soft.   Musculoskeletal:        General: Normal range of motion.     Cervical back: Normal range of motion.   Skin:    General: Skin is warm and dry.   Neurological:     Mental Status: She is alert and oriented to person, place, and time.     Coordination: Coordination normal.   Psychiatric:        Behavior: Behavior normal. Behavior is cooperative.        Thought Content: Thought content normal.        Judgment: Judgment normal.          Patient has been counseled extensively about nutrition and exercise as well as the importance of adherence with medications and regular follow-up. The patient was given clear instructions to go to ER or return to medical center if symptoms don't improve, worsen or new problems develop. The patient verbalized understanding.   Follow-up: Return in about 3 months (around 04/24/2024).   Collins Dean, FNP-BC [facility] [place], [place] [phone]   01/23/2024, 12:19 PM

## 2024-01-28 ENCOUNTER — Other Ambulatory Visit: Payer: Self-pay

## 2024-02-04 ENCOUNTER — Other Ambulatory Visit: Payer: Self-pay

## 2024-02-04 ENCOUNTER — Other Ambulatory Visit (HOSPITAL_COMMUNITY): Payer: Self-pay

## 2024-02-25 NOTE — Addendum Note (Signed)
 Addended by: TAWNI DRILLING D on: 02/25/2024 10:05 AM   Modules accepted: Orders

## 2024-02-25 NOTE — Progress Notes (Signed)
 Remote pacemaker transmission.

## 2024-02-27 ENCOUNTER — Encounter: Payer: Self-pay | Admitting: Advanced Practice Midwife

## 2024-03-29 ENCOUNTER — Other Ambulatory Visit: Payer: Self-pay | Admitting: Nurse Practitioner

## 2024-03-29 ENCOUNTER — Other Ambulatory Visit: Payer: Self-pay

## 2024-03-29 DIAGNOSIS — K219 Gastro-esophageal reflux disease without esophagitis: Secondary | ICD-10-CM

## 2024-03-29 DIAGNOSIS — F5101 Primary insomnia: Secondary | ICD-10-CM

## 2024-03-29 MED ORDER — TRAZODONE HCL 50 MG PO TABS
50.0000 mg | ORAL_TABLET | Freq: Every evening | ORAL | 0 refills | Status: DC | PRN
  Filled 2024-03-29: qty 60, 30d supply, fill #0

## 2024-03-29 MED ORDER — OMEPRAZOLE 20 MG PO CPDR
20.0000 mg | DELAYED_RELEASE_CAPSULE | Freq: Every day | ORAL | 3 refills | Status: AC
  Filled 2024-03-29: qty 90, 90d supply, fill #0
  Filled 2024-09-16: qty 30, 30d supply, fill #1

## 2024-03-30 ENCOUNTER — Other Ambulatory Visit: Payer: Self-pay

## 2024-04-01 ENCOUNTER — Other Ambulatory Visit: Payer: Self-pay

## 2024-04-02 ENCOUNTER — Other Ambulatory Visit: Payer: Self-pay

## 2024-04-06 ENCOUNTER — Ambulatory Visit (INDEPENDENT_AMBULATORY_CARE_PROVIDER_SITE_OTHER): Payer: No Typology Code available for payment source

## 2024-04-06 DIAGNOSIS — I495 Sick sinus syndrome: Secondary | ICD-10-CM

## 2024-04-07 ENCOUNTER — Ambulatory Visit: Payer: Self-pay | Admitting: Internal Medicine

## 2024-04-07 LAB — CUP PACEART REMOTE DEVICE CHECK
Battery Remaining Longevity: 99 mo
Battery Voltage: 2.99 V
Brady Statistic AP VP Percent: 96.05 %
Brady Statistic AP VS Percent: 2.53 %
Brady Statistic AS VP Percent: 1.37 %
Brady Statistic AS VS Percent: 0.06 %
Brady Statistic RA Percent Paced: 98.57 %
Brady Statistic RV Percent Paced: 97.42 %
Date Time Interrogation Session: 20250825203034
Implantable Lead Connection Status: 753985
Implantable Lead Connection Status: 753985
Implantable Lead Implant Date: 20200817
Implantable Lead Implant Date: 20200828
Implantable Lead Location: 753859
Implantable Lead Location: 753860
Implantable Lead Model: 5076
Implantable Lead Model: 5076
Implantable Pulse Generator Implant Date: 20200817
Lead Channel Impedance Value: 342 Ohm
Lead Channel Impedance Value: 342 Ohm
Lead Channel Impedance Value: 380 Ohm
Lead Channel Impedance Value: 437 Ohm
Lead Channel Pacing Threshold Amplitude: 0.5 V
Lead Channel Pacing Threshold Amplitude: 0.625 V
Lead Channel Pacing Threshold Pulse Width: 0.4 ms
Lead Channel Pacing Threshold Pulse Width: 0.4 ms
Lead Channel Sensing Intrinsic Amplitude: 0.75 mV
Lead Channel Sensing Intrinsic Amplitude: 0.75 mV
Lead Channel Sensing Intrinsic Amplitude: 9.875 mV
Lead Channel Sensing Intrinsic Amplitude: 9.875 mV
Lead Channel Setting Pacing Amplitude: 1.5 V
Lead Channel Setting Pacing Amplitude: 2.5 V
Lead Channel Setting Pacing Pulse Width: 0.4 ms
Lead Channel Setting Sensing Sensitivity: 1.2 mV
Zone Setting Status: 755011
Zone Setting Status: 755011

## 2024-04-22 ENCOUNTER — Other Ambulatory Visit: Payer: Self-pay

## 2024-04-22 ENCOUNTER — Other Ambulatory Visit: Payer: Self-pay | Admitting: Nurse Practitioner

## 2024-04-22 DIAGNOSIS — I10 Essential (primary) hypertension: Secondary | ICD-10-CM

## 2024-04-22 DIAGNOSIS — F419 Anxiety disorder, unspecified: Secondary | ICD-10-CM

## 2024-04-22 MED ORDER — ESCITALOPRAM OXALATE 10 MG PO TABS
10.0000 mg | ORAL_TABLET | Freq: Every day | ORAL | 1 refills | Status: AC
  Filled 2024-04-22: qty 90, 90d supply, fill #0
  Filled 2024-07-26 (×2): qty 90, 90d supply, fill #1

## 2024-04-22 MED ORDER — SPIRONOLACTONE 100 MG PO TABS
100.0000 mg | ORAL_TABLET | Freq: Every day | ORAL | 0 refills | Status: DC
Start: 2024-04-22 — End: 2024-04-26
  Filled 2024-04-22: qty 90, 90d supply, fill #0

## 2024-04-26 ENCOUNTER — Other Ambulatory Visit: Payer: Self-pay

## 2024-04-26 ENCOUNTER — Encounter: Payer: Self-pay | Admitting: Nurse Practitioner

## 2024-04-26 ENCOUNTER — Ambulatory Visit: Payer: Self-pay | Attending: Nurse Practitioner | Admitting: Nurse Practitioner

## 2024-04-26 VITALS — BP 136/79 | HR 76 | Resp 19 | Ht 60.0 in | Wt 238.6 lb

## 2024-04-26 DIAGNOSIS — Z23 Encounter for immunization: Secondary | ICD-10-CM

## 2024-04-26 DIAGNOSIS — Z79899 Other long term (current) drug therapy: Secondary | ICD-10-CM

## 2024-04-26 DIAGNOSIS — H1013 Acute atopic conjunctivitis, bilateral: Secondary | ICD-10-CM

## 2024-04-26 DIAGNOSIS — I10 Essential (primary) hypertension: Secondary | ICD-10-CM

## 2024-04-26 DIAGNOSIS — R519 Headache, unspecified: Secondary | ICD-10-CM

## 2024-04-26 DIAGNOSIS — L308 Other specified dermatitis: Secondary | ICD-10-CM

## 2024-04-26 DIAGNOSIS — F5101 Primary insomnia: Secondary | ICD-10-CM

## 2024-04-26 MED ORDER — OLOPATADINE HCL 0.2 % OP SOLN
1.0000 [drp] | Freq: Every day | OPHTHALMIC | 0 refills | Status: AC
  Filled 2024-04-26: qty 2.5, 25d supply, fill #0

## 2024-04-26 MED ORDER — TRAZODONE HCL 50 MG PO TABS
50.0000 mg | ORAL_TABLET | Freq: Every evening | ORAL | 1 refills | Status: AC | PRN
  Filled 2024-04-26 – 2024-06-02 (×2): qty 90, 45d supply, fill #0

## 2024-04-26 MED ORDER — SPIRONOLACTONE 100 MG PO TABS
100.0000 mg | ORAL_TABLET | Freq: Every day | ORAL | 1 refills | Status: AC
  Filled 2024-04-26: qty 90, 90d supply, fill #0
  Filled 2024-07-26 (×2): qty 90, 90d supply, fill #1

## 2024-04-26 MED ORDER — HYDROCORTISONE 2.5 % EX CREA
TOPICAL_CREAM | Freq: Two times a day (BID) | CUTANEOUS | 0 refills | Status: AC
  Filled 2024-04-26: qty 30, 30d supply, fill #0

## 2024-04-26 NOTE — Progress Notes (Signed)
 Remote PPM Transmission

## 2024-04-26 NOTE — Progress Notes (Signed)
 Assessment & Plan:  Cordella was seen today for hypertension and headache.  Diagnoses and all orders for this visit:  Primary hypertension -     spironolactone  (ALDACTONE ) 100 MG tablet; Take 1 tablet (100 mg total) by mouth daily. para la presion arterial -     CMP14+EGFR Continue all antihypertensives as prescribed.  Reminded to bring in blood pressure log for follow  up appointment.  RECOMMENDATIONS: DASH/Mediterranean Diets are healthier choices for HTN.    Primary insomnia -     traZODone  (DESYREL ) 50 MG tablet; Take 1-2 tablets (50-100 mg total) by mouth at bedtime as needed for sleep. PARA DORMIR   ,Frequent headaches Chronic daily headaches for three months, likely linked to sleep apnea. - Recommend trial of over-the-counter extra strength Tylenol  for one week. - Advise application for financial assistance for a sleep study. - Instruct to report if headaches persist after one week for possible prescription medication. Patient has been advised to apply for financial assistance and schedule to see our financial counselor.    Other eczema Dry, red areas around eyes indicate eczema. -     hydrocortisone  2.5 % cream; Apply topically 2 (two) times daily.  Allergic conjunctivitis of both eyes -     Olopatadine  HCl 0.2 % SOLN; Apply 1 drop to eye daily. Eye discharge and itching suggest allergic conjunctivitis. - Prescribe allergy eye drops.   General Health Maintenance Due for flu vaccine. - Administer flu vaccine. Patient has been counseled on age-appropriate routine health concerns for screening and prevention. These are reviewed and up-to-date. Referrals have been placed accordingly. Immunizations are up-to-date or declined.    Subjective:   Chief Complaint  Patient presents with   Hypertension   Headache    No otc medications tried.    History of Present Illness Anita [name] is a 68 year old female who presents with daily headaches and eye  discharge.  VRI was used to communicate directly with patient for the entire encounter including providing detailed patient instructions.    She has been experiencing headaches nearly every day for the past three months. These headaches typically occur in the morning upon waking and last about 30 minutes. The pain is described as moderate, with a severity of 5 out of 10, and is accompanied by ear ringing and tingling in the head. She has not been using any over-the-counter medications for relief.  HTN Blood pressure well-controlled with amlodipine  10 mg daily and spironolactone  100 mg daily as directed BP Readings from Last 3 Encounters:  04/26/24 136/79  01/23/24 118/74  10/22/23 (!) 159/89     She reports having discharge from her eyes throughout the day, which she describes as similar to the crusting that occurs upon waking. The discharge is present on both the inside and outer parts of her eyes. She experiences itching around the eyes.    She reports snoring at night but denies having sleep apnea. She experiences morning headaches as well. BMI 46.60   Review of Systems  Constitutional:  Negative for fever, malaise/fatigue and weight loss.  HENT: Negative.  Negative for nosebleeds.   Eyes:  Positive for discharge and redness. Negative for blurred vision, double vision, photophobia and pain.  Respiratory: Negative.  Negative for cough and shortness of breath.   Cardiovascular: Negative.  Negative for chest pain, palpitations and leg swelling.  Gastrointestinal: Negative.  Negative for heartburn, nausea and vomiting.  Musculoskeletal: Negative.  Negative for myalgias.  Skin:  Positive for rash.  Neurological:  Positive for headaches. Negative for dizziness, focal weakness and seizures.  Psychiatric/Behavioral: Negative.  Negative for suicidal ideas.     Past Medical History:  Diagnosis Date   ABDOMINAL PAIN 05/05/2009   Qualifier: Diagnosis of  By: Lelon RIGGERS, Scott      Adjustment disorder with depressed mood 08/30/2009   Qualifier: Diagnosis of  By: Lelon RIGGERS, Scott     ALLERGIC RHINITIS 12/04/2009   Qualifier: Diagnosis of  By: Lelon RIGGERS, Scott     Allergy    Anxiety    Aortic stenosis    mild by echo 03/2019   CHOLELITHIASIS 05/30/2009   Qualifier: Diagnosis of  By: Lelon RIGGERS, Scott     COLONIC POLYPS, HX OF 05/09/2010   Qualifier: Diagnosis of  By: Lelon RIGGERS [place]     Depression    Essential hypertension, benign 05/05/2009   Qualifier: Diagnosis of  By: Lelon RIGGERS, Scott     GERD 06/12/2009   Qualifier: Diagnosis of  By: Lelon RIGGERS, Scott     Heart murmur    Pacemaker   Hypertension    KNEE PAIN 06/12/2009   Qualifier: Diagnosis of  By: Lelon RIGGERS [place]     LIVER MASS 05/30/2009   Qualifier: Diagnosis of  By: Lelon RIGGERS, Scott     OBESITY 10/24/2010   Qualifier: Diagnosis of  By: Boneta RN, Melinda     Sleep apnea    sleep study on hold   TINNITUS, CHRONIC, RIGHT 09/07/2010   Qualifier: Diagnosis of  By: Adella MD, Almarie MAUDLIN LOSS 09/07/2010   Qualifier: Diagnosis of  By: Adella MD, Almarie POUNDS VAGINITIS 08/30/2009   Qualifier: Diagnosis of  By: Lelon RIGGERS, Scott     VARICOSE VEINS, LOWER EXTREMITIES 05/05/2009   Qualifier: Diagnosis of  By: Lelon RIGGERS [place]      Past Surgical History:  Procedure Laterality Date   APPENDECTOMY     CESAREAN SECTION WITH BILATERAL TUBAL LIGATION     CHOLECYSTECTOMY     COLONOSCOPY  2011   LEAD REVISION/REPAIR N/A 04/09/2019   Procedure: LEAD REVISION/REPAIR;  Surgeon: Waddell Danelle ORN, MD;  Location: [facility];  Service: Cardiovascular;  Laterality: N/A;   PACEMAKER IMPLANT N/A 03/29/2019   Procedure: PACEMAKER IMPLANT;  Surgeon: Waddell Danelle ORN, MD;  Location: [facility];  Service: Cardiovascular;  Laterality: N/A;    Family History  Problem Relation Age of Onset   Hypertension Sister    Hyperlipidemia Sister    Hypertension Sister     Hyperlipidemia Sister    Cancer Sister    Stomach cancer Sister        passed away with the CA   Hypertension Sister    Hyperlipidemia Sister    Migraines Daughter    Diabetes Paternal Grandmother    Hypertension Paternal Grandmother    Hypertension Brother    Hyperlipidemia Brother    Hypertension Brother    Hyperlipidemia Brother    Hypertension Brother    Hyperlipidemia Brother    Hypertension Brother    Hyperlipidemia Brother    Hypertension Brother    Hyperlipidemia Brother    Colon cancer Neg Hx    Colon polyps Neg Hx    Esophageal cancer Neg Hx    Rectal cancer Neg Hx    Breast cancer Neg Hx     Social History Reviewed with no changes to be made today.   Outpatient Medications Prior to Visit  Medication Sig Dispense Refill   amLODipine  (NORVASC ) 10 MG tablet Take 1 tablet (10 mg total) by mouth daily. para la presion arterial 90 tablet 1   atorvastatin  (LIPITOR) 20 MG tablet Take 1 tablet (20 mg total) by mouth daily. FOR CHOLESTEROL 90 tablet 3   escitalopram  (LEXAPRO ) 10 MG tablet Take 1 tablet (10 mg total) by mouth daily. para la ansiedad 90 tablet 1   spironolactone  (ALDACTONE ) 100 MG tablet Take 1 tablet (100 mg total) by mouth daily. para la presion arterial 90 tablet 0   triamcinolone  cream (KENALOG ) 0.1 % Apply 1 Application topically 2 (two) times daily. 80 g 1   traZODone  (DESYREL ) 50 MG tablet Take 1-2 tablets (50-100 mg total) by mouth at bedtime as needed for sleep. PARA DORMIR 60 tablet 0   gabapentin  (NEURONTIN ) 100 MG capsule Take 1 capsule (100 mg total) by mouth 3 (three) times daily as needed. para el dolor de piernas. tomar solo lo necesario 90 capsule 3   omeprazole  (PRILOSEC) 20 MG capsule Take 1 capsule (20 mg total) by mouth daily. 30 capsule 3   No facility-administered medications prior to visit.    Allergies  Allergen Reactions   Chlorthalidone      hypokalemia   Lisinopril  Other (See Comments)    Chills and arm pain        Objective:    BP 136/79 (BP Location: Left Arm, Patient Position: Sitting, Cuff Size: Normal)   Pulse 76   Resp 19   Ht 5' (1.524 m)   Wt 238 lb 9.6 oz (108.2 kg)   SpO2 98%   BMI 46.60 kg/m  Wt Readings from Last 3 Encounters:  04/26/24 238 lb 9.6 oz (108.2 kg)  01/23/24 236 lb (107 kg)  10/22/23 242 lb 9.6 oz (110 kg)    Physical Exam Vitals and nursing note reviewed.  Constitutional:      Appearance: She is well-developed.  HENT:     Head: Normocephalic and atraumatic.  Cardiovascular:     Rate and Rhythm: Normal rate and regular rhythm.     Heart sounds: Normal heart sounds. No murmur heard.    No friction rub. No gallop.  Pulmonary:     Effort: Pulmonary effort is normal. No tachypnea or respiratory distress.     Breath sounds: Normal breath sounds. No decreased breath sounds, wheezing, rhonchi or rales.  Chest:     Chest wall: No tenderness.  Musculoskeletal:        General: Normal range of motion.     Cervical back: Normal range of motion.  Skin:    General: Skin is warm and dry.     Findings: Rash present. Rash is macular.      Neurological:     Mental Status: She is alert and oriented to person, place, and time.     Coordination: Coordination normal.  Psychiatric:        Behavior: Behavior normal. Behavior is cooperative.        Thought Content: Thought content normal.        Judgment: Judgment normal.          Patient has been counseled extensively about nutrition and exercise as well as the importance of adherence with medications and regular follow-up. The patient was given clear instructions to go to ER or return to medical center if symptoms don't improve, worsen or new problems develop. The patient verbalized understanding.   Follow-up: Return in about 3 months (around 07/30/2024).   Will Heinkel W  Theotis, FNP-BC [facility] [place], [place] [phone]   04/26/2024, 1:52 PM

## 2024-04-27 LAB — CMP14+EGFR
ALT: 21 IU/L (ref 0–32)
AST: 20 IU/L (ref 0–40)
Albumin: 4.6 g/dL (ref 3.9–4.9)
Alkaline Phosphatase: 78 IU/L (ref 51–125)
BUN/Creatinine Ratio: 20 (ref 12–28)
BUN: 21 mg/dL (ref 8–27)
Bilirubin Total: 0.6 mg/dL (ref 0.0–1.2)
CO2: 22 mmol/L (ref 20–29)
Calcium: 9.7 mg/dL (ref 8.7–10.3)
Chloride: 101 mmol/L (ref 96–106)
Creatinine, Ser: 1.06 mg/dL — ABNORMAL HIGH (ref 0.57–1.00)
Globulin, Total: 2.7 g/dL (ref 1.5–4.5)
Glucose: 104 mg/dL — ABNORMAL HIGH (ref 70–99)
Potassium: 4.9 mmol/L (ref 3.5–5.2)
Sodium: 139 mmol/L (ref 134–144)
Total Protein: 7.3 g/dL (ref 6.0–8.5)
eGFR: 57 mL/min/1.73 — ABNORMAL LOW (ref >59)

## 2024-04-28 ENCOUNTER — Ambulatory Visit: Payer: Self-pay | Admitting: Nurse Practitioner

## 2024-06-01 ENCOUNTER — Other Ambulatory Visit: Payer: Self-pay

## 2024-06-02 ENCOUNTER — Other Ambulatory Visit: Payer: Self-pay

## 2024-06-04 ENCOUNTER — Other Ambulatory Visit: Payer: Self-pay

## 2024-07-06 ENCOUNTER — Ambulatory Visit: Payer: Self-pay

## 2024-07-06 DIAGNOSIS — I495 Sick sinus syndrome: Secondary | ICD-10-CM

## 2024-07-07 LAB — CUP PACEART REMOTE DEVICE CHECK
Battery Remaining Longevity: 94 mo
Battery Voltage: 2.98 V
Brady Statistic AP VP Percent: 98.3 %
Brady Statistic AP VS Percent: 0.26 %
Brady Statistic AS VP Percent: 1.43 %
Brady Statistic AS VS Percent: 0.01 %
Brady Statistic RA Percent Paced: 98.56 %
Brady Statistic RV Percent Paced: 99.72 %
Date Time Interrogation Session: 20251124211614
Implantable Lead Connection Status: 753985
Implantable Lead Connection Status: 753985
Implantable Lead Implant Date: 20200817
Implantable Lead Implant Date: 20200828
Implantable Lead Location: 753859
Implantable Lead Location: 753860
Implantable Lead Model: 5076
Implantable Lead Model: 5076
Implantable Pulse Generator Implant Date: 20200817
Lead Channel Impedance Value: 361 Ohm
Lead Channel Impedance Value: 361 Ohm
Lead Channel Impedance Value: 399 Ohm
Lead Channel Impedance Value: 437 Ohm
Lead Channel Pacing Threshold Amplitude: 0.375 V
Lead Channel Pacing Threshold Amplitude: 0.625 V
Lead Channel Pacing Threshold Pulse Width: 0.4 ms
Lead Channel Pacing Threshold Pulse Width: 0.4 ms
Lead Channel Sensing Intrinsic Amplitude: 1.75 mV
Lead Channel Sensing Intrinsic Amplitude: 1.75 mV
Lead Channel Sensing Intrinsic Amplitude: 10.5 mV
Lead Channel Sensing Intrinsic Amplitude: 10.5 mV
Lead Channel Setting Pacing Amplitude: 1.5 V
Lead Channel Setting Pacing Amplitude: 2.5 V
Lead Channel Setting Pacing Pulse Width: 0.4 ms
Lead Channel Setting Sensing Sensitivity: 1.2 mV
Zone Setting Status: 755011
Zone Setting Status: 755011

## 2024-07-12 NOTE — Progress Notes (Signed)
 Remote PPM Transmission

## 2024-07-15 ENCOUNTER — Ambulatory Visit: Payer: Self-pay | Admitting: Internal Medicine

## 2024-07-26 ENCOUNTER — Ambulatory Visit: Payer: Self-pay | Admitting: Nurse Practitioner

## 2024-07-26 ENCOUNTER — Other Ambulatory Visit: Payer: Self-pay

## 2024-07-26 ENCOUNTER — Other Ambulatory Visit: Payer: Self-pay | Admitting: Nurse Practitioner

## 2024-07-26 DIAGNOSIS — L309 Dermatitis, unspecified: Secondary | ICD-10-CM

## 2024-07-26 MED ORDER — TRIAMCINOLONE ACETONIDE 0.1 % EX CREA
1.0000 | TOPICAL_CREAM | Freq: Two times a day (BID) | CUTANEOUS | 1 refills | Status: AC
  Filled 2024-07-26: qty 15, 30d supply, fill #0
  Filled 2024-09-16 (×2): qty 80, 40d supply, fill #1

## 2024-07-29 ENCOUNTER — Other Ambulatory Visit: Payer: Self-pay

## 2024-09-06 ENCOUNTER — Ambulatory Visit: Payer: Self-pay | Admitting: Nurse Practitioner

## 2024-09-16 ENCOUNTER — Other Ambulatory Visit: Payer: Self-pay

## 2024-09-17 ENCOUNTER — Other Ambulatory Visit: Payer: Self-pay

## 2024-09-20 ENCOUNTER — Ambulatory Visit: Payer: Self-pay | Admitting: Nurse Practitioner

## 2024-10-05 ENCOUNTER — Ambulatory Visit: Payer: No Typology Code available for payment source

## 2025-01-04 ENCOUNTER — Ambulatory Visit: Payer: No Typology Code available for payment source

## 2025-04-05 ENCOUNTER — Ambulatory Visit: Payer: No Typology Code available for payment source

## 2025-07-05 ENCOUNTER — Ambulatory Visit: Payer: No Typology Code available for payment source
